# Patient Record
Sex: Male | Born: 1940 | Race: White | Hispanic: No | Marital: Married | Smoking: Former smoker
Health system: Southern US, Community
[De-identification: ages and names within clinical notes are randomized; demographics above are authoritative.]

## PROBLEM LIST (undated history)

## (undated) DIAGNOSIS — I10 Essential (primary) hypertension: Secondary | ICD-10-CM

## (undated) DIAGNOSIS — M1990 Unspecified osteoarthritis, unspecified site: Secondary | ICD-10-CM

## (undated) DIAGNOSIS — M5126 Other intervertebral disc displacement, lumbar region: Secondary | ICD-10-CM

## (undated) DIAGNOSIS — E785 Hyperlipidemia, unspecified: Secondary | ICD-10-CM

## (undated) DIAGNOSIS — N210 Calculus in bladder: Secondary | ICD-10-CM

## (undated) DIAGNOSIS — Z87442 Personal history of urinary calculi: Secondary | ICD-10-CM

## (undated) DIAGNOSIS — I82409 Acute embolism and thrombosis of unspecified deep veins of unspecified lower extremity: Secondary | ICD-10-CM

## (undated) DIAGNOSIS — N400 Enlarged prostate without lower urinary tract symptoms: Secondary | ICD-10-CM

## (undated) DIAGNOSIS — L309 Dermatitis, unspecified: Secondary | ICD-10-CM

## (undated) HISTORY — DX: Hyperlipidemia, unspecified: E78.5

## (undated) HISTORY — DX: Essential (primary) hypertension: I10

## (undated) HISTORY — PX: CHOLECYSTECTOMY: SHX55

---

## 2004-11-21 ENCOUNTER — Ambulatory Visit: Payer: Self-pay | Admitting: Family Medicine

## 2004-11-28 ENCOUNTER — Ambulatory Visit: Payer: Self-pay | Admitting: Family Medicine

## 2005-05-22 ENCOUNTER — Ambulatory Visit: Payer: Self-pay | Admitting: Family Medicine

## 2005-06-01 ENCOUNTER — Ambulatory Visit: Payer: Self-pay | Admitting: Family Medicine

## 2005-12-17 ENCOUNTER — Ambulatory Visit: Payer: Self-pay | Admitting: Family Medicine

## 2005-12-25 ENCOUNTER — Ambulatory Visit: Payer: Self-pay | Admitting: Family Medicine

## 2006-01-14 ENCOUNTER — Ambulatory Visit: Payer: Self-pay | Admitting: Gastroenterology

## 2006-01-27 ENCOUNTER — Ambulatory Visit: Payer: Self-pay | Admitting: Gastroenterology

## 2006-05-10 ENCOUNTER — Ambulatory Visit: Payer: Self-pay | Admitting: Family Medicine

## 2006-05-27 ENCOUNTER — Ambulatory Visit: Payer: Self-pay | Admitting: Family Medicine

## 2006-06-07 ENCOUNTER — Ambulatory Visit: Payer: Self-pay | Admitting: Family Medicine

## 2006-11-29 ENCOUNTER — Ambulatory Visit: Payer: Self-pay | Admitting: Family Medicine

## 2006-11-29 LAB — CONVERTED CEMR LAB
ALT: 19 units/L (ref 0–40)
AST: 17 units/L (ref 0–37)
Albumin: 4.1 g/dL (ref 3.5–5.2)
Alkaline Phosphatase: 49 units/L (ref 39–117)
Bilirubin, Direct: 0.2 mg/dL (ref 0.0–0.3)
Cholesterol: 147 mg/dL (ref 0–200)
HDL: 33.5 mg/dL — ABNORMAL LOW (ref >39.0)
LDL Cholesterol: 83 mg/dL (ref 0–99)
Total Bilirubin: 1 mg/dL (ref 0.3–1.2)
Total CHOL/HDL Ratio: 4.4
Total Protein: 7.4 g/dL (ref 6.0–8.3)
Triglycerides: 152 mg/dL — ABNORMAL HIGH (ref 0–149)
VLDL: 30 mg/dL (ref 0–40)

## 2006-12-08 ENCOUNTER — Ambulatory Visit: Payer: Self-pay | Admitting: Family Medicine

## 2007-05-09 DIAGNOSIS — I10 Essential (primary) hypertension: Secondary | ICD-10-CM | POA: Insufficient documentation

## 2007-06-03 ENCOUNTER — Ambulatory Visit: Payer: Self-pay | Admitting: Family Medicine

## 2007-06-03 LAB — CONVERTED CEMR LAB
Bilirubin Urine: NEGATIVE
Blood in Urine, dipstick: NEGATIVE
Glucose, Urine, Semiquant: NEGATIVE
Ketones, urine, test strip: NEGATIVE
Nitrite: NEGATIVE
Protein, U semiquant: NEGATIVE
Specific Gravity, Urine: 1.025
Urobilinogen, UA: 0.2
WBC Urine, dipstick: NEGATIVE
pH: 5

## 2007-06-07 LAB — CONVERTED CEMR LAB
ALT: 20 units/L (ref 0–53)
AST: 20 units/L (ref 0–37)
Albumin: 3.9 g/dL (ref 3.5–5.2)
Alkaline Phosphatase: 49 units/L (ref 39–117)
BUN: 16 mg/dL (ref 6–23)
Basophils Absolute: 0 10*3/uL (ref 0.0–0.1)
Basophils Relative: 0.7 % (ref 0.0–1.0)
Bilirubin, Direct: 0.2 mg/dL (ref 0.0–0.3)
CO2: 31 meq/L (ref 19–32)
Calcium: 9.3 mg/dL (ref 8.4–10.5)
Chloride: 106 meq/L (ref 96–112)
Cholesterol: 138 mg/dL (ref 0–200)
Creatinine, Ser: 1.2 mg/dL (ref 0.4–1.5)
Creatinine,U: 262.2 mg/dL
Eosinophils Absolute: 0.1 10*3/uL (ref 0.0–0.6)
Eosinophils Relative: 1.9 % (ref 0.0–5.0)
GFR calc Af Amer: 78 mL/min
GFR calc non Af Amer: 64 mL/min
Glucose, Bld: 149 mg/dL — ABNORMAL HIGH (ref 70–99)
HCT: 44.2 % (ref 39.0–52.0)
HDL: 26 mg/dL — ABNORMAL LOW (ref >39.0)
Hemoglobin: 15.2 g/dL (ref 13.0–17.0)
Hgb A1c MFr Bld: 6.7 % — ABNORMAL HIGH (ref 4.6–6.0)
LDL Cholesterol: 92 mg/dL (ref 0–99)
Lymphocytes Relative: 33.4 % (ref 12.0–46.0)
MCHC: 34.4 g/dL (ref 30.0–36.0)
MCV: 89 fL (ref 78.0–100.0)
Microalb Creat Ratio: 4.2 mg/g (ref 0.0–30.0)
Microalb, Ur: 1.1 mg/dL (ref 0.0–1.9)
Monocytes Absolute: 0.7 10*3/uL (ref 0.2–0.7)
Monocytes Relative: 10.7 % (ref 3.0–11.0)
Neutro Abs: 3.8 10*3/uL (ref 1.4–7.7)
Neutrophils Relative %: 53.3 % (ref 43.0–77.0)
PSA: 1.49 ng/mL (ref 0.10–4.00)
Platelets: 165 10*3/uL (ref 150–400)
Potassium: 4 meq/L (ref 3.5–5.1)
RBC: 4.96 M/uL (ref 4.22–5.81)
RDW: 12.2 % (ref 11.5–14.6)
Sodium: 143 meq/L (ref 135–145)
TSH: 3.05 microintl units/mL (ref 0.35–5.50)
Total Bilirubin: 1.5 mg/dL — ABNORMAL HIGH (ref 0.3–1.2)
Total CHOL/HDL Ratio: 5.3
Total Protein: 7.2 g/dL (ref 6.0–8.3)
Triglycerides: 100 mg/dL (ref 0–149)
VLDL: 20 mg/dL (ref 0–40)
WBC: 6.9 10*3/uL (ref 4.5–10.5)

## 2007-06-14 ENCOUNTER — Ambulatory Visit: Payer: Self-pay | Admitting: Family Medicine

## 2007-06-14 DIAGNOSIS — E785 Hyperlipidemia, unspecified: Secondary | ICD-10-CM | POA: Insufficient documentation

## 2007-06-14 DIAGNOSIS — E119 Type 2 diabetes mellitus without complications: Secondary | ICD-10-CM | POA: Insufficient documentation

## 2007-06-17 ENCOUNTER — Telehealth: Payer: Self-pay | Admitting: Family Medicine

## 2007-08-18 ENCOUNTER — Ambulatory Visit: Payer: Self-pay | Admitting: Family Medicine

## 2007-08-22 ENCOUNTER — Telehealth: Payer: Self-pay | Admitting: Family Medicine

## 2007-12-01 ENCOUNTER — Ambulatory Visit: Payer: Self-pay | Admitting: Family Medicine

## 2007-12-02 LAB — CONVERTED CEMR LAB
ALT: 20 units/L (ref 0–53)
AST: 21 units/L (ref 0–37)
Albumin: 3.9 g/dL (ref 3.5–5.2)
Alkaline Phosphatase: 46 units/L (ref 39–117)
Bilirubin, Direct: 0.2 mg/dL (ref 0.0–0.3)
Cholesterol: 139 mg/dL (ref 0–200)
HDL: 30 mg/dL — ABNORMAL LOW (ref >39.0)
Hgb A1c MFr Bld: 6.2 % — ABNORMAL HIGH (ref 4.6–6.0)
LDL Cholesterol: 91 mg/dL (ref 0–99)
Total Bilirubin: 1.3 mg/dL — ABNORMAL HIGH (ref 0.3–1.2)
Total CHOL/HDL Ratio: 4.6
Total Protein: 7.1 g/dL (ref 6.0–8.3)
Triglycerides: 91 mg/dL (ref 0–149)
VLDL: 18 mg/dL (ref 0–40)

## 2008-06-18 ENCOUNTER — Ambulatory Visit: Payer: Self-pay | Admitting: Family Medicine

## 2008-06-20 LAB — CONVERTED CEMR LAB
ALT: 19 units/L (ref 0–53)
AST: 23 units/L (ref 0–37)
Albumin: 4.1 g/dL (ref 3.5–5.2)
Alkaline Phosphatase: 45 units/L (ref 39–117)
BUN: 18 mg/dL (ref 6–23)
Basophils Absolute: 0 10*3/uL (ref 0.0–0.1)
Basophils Relative: 0.4 % (ref 0.0–3.0)
Bilirubin, Direct: 0.1 mg/dL (ref 0.0–0.3)
CO2: 29 meq/L (ref 19–32)
Calcium: 9.1 mg/dL (ref 8.4–10.5)
Chloride: 105 meq/L (ref 96–112)
Cholesterol: 142 mg/dL (ref 0–200)
Creatinine, Ser: 1.1 mg/dL (ref 0.4–1.5)
Creatinine,U: 138.8 mg/dL
Eosinophils Absolute: 0.1 10*3/uL (ref 0.0–0.7)
Eosinophils Relative: 1.7 % (ref 0.0–5.0)
GFR calc Af Amer: 86 mL/min
GFR calc non Af Amer: 71 mL/min
Glucose, Bld: 118 mg/dL — ABNORMAL HIGH (ref 70–99)
HCT: 47.6 % (ref 39.0–52.0)
HDL: 33.8 mg/dL — ABNORMAL LOW (ref >39.0)
Hemoglobin: 16.3 g/dL (ref 13.0–17.0)
Hgb A1c MFr Bld: 6.1 % — ABNORMAL HIGH (ref 4.6–6.0)
LDL Cholesterol: 83 mg/dL (ref 0–99)
Lymphocytes Relative: 28.2 % (ref 12.0–46.0)
MCHC: 34.3 g/dL (ref 30.0–36.0)
MCV: 90.7 fL (ref 78.0–100.0)
Microalb Creat Ratio: 2.9 mg/g (ref 0.0–30.0)
Microalb, Ur: 0.4 mg/dL (ref 0.0–1.9)
Monocytes Absolute: 0.6 10*3/uL (ref 0.1–1.0)
Monocytes Relative: 8.5 % (ref 3.0–12.0)
Neutro Abs: 4.2 10*3/uL (ref 1.4–7.7)
Neutrophils Relative %: 61.2 % (ref 43.0–77.0)
PSA: 1.29 ng/mL (ref 0.10–4.00)
Platelets: 151 10*3/uL (ref 150–400)
Potassium: 3.6 meq/L (ref 3.5–5.1)
RBC: 5.25 M/uL (ref 4.22–5.81)
RDW: 12.3 % (ref 11.5–14.6)
Sodium: 143 meq/L (ref 135–145)
TSH: 3.44 microintl units/mL (ref 0.35–5.50)
Total Bilirubin: 1.1 mg/dL (ref 0.3–1.2)
Total CHOL/HDL Ratio: 4.2
Total Protein: 7.5 g/dL (ref 6.0–8.3)
Triglycerides: 127 mg/dL (ref 0–149)
VLDL: 25 mg/dL (ref 0–40)
WBC: 6.8 10*3/uL (ref 4.5–10.5)

## 2008-06-25 ENCOUNTER — Ambulatory Visit: Payer: Self-pay | Admitting: Family Medicine

## 2008-11-06 ENCOUNTER — Telehealth: Payer: Self-pay | Admitting: Family Medicine

## 2008-12-25 ENCOUNTER — Ambulatory Visit: Payer: Self-pay | Admitting: Family Medicine

## 2008-12-26 ENCOUNTER — Encounter: Payer: Self-pay | Admitting: Family Medicine

## 2008-12-26 LAB — CONVERTED CEMR LAB
ALT: 22 units/L (ref 0–53)
AST: 20 units/L (ref 0–37)
Albumin: 3.9 g/dL (ref 3.5–5.2)
Alkaline Phosphatase: 47 units/L (ref 39–117)
Bilirubin, Direct: 0.1 mg/dL (ref 0.0–0.3)
Cholesterol: 138 mg/dL (ref 0–200)
HDL: 29.7 mg/dL — ABNORMAL LOW (ref >39.00)
Hgb A1c MFr Bld: 6.3 % (ref 4.6–6.5)
LDL Cholesterol: 79 mg/dL (ref 0–99)
Total Bilirubin: 1.1 mg/dL (ref 0.3–1.2)
Total CHOL/HDL Ratio: 5
Total Protein: 7.4 g/dL (ref 6.0–8.3)
Triglycerides: 149 mg/dL (ref 0.0–149.0)
VLDL: 29.8 mg/dL (ref 0.0–40.0)

## 2009-01-01 ENCOUNTER — Ambulatory Visit: Payer: Self-pay | Admitting: Family Medicine

## 2009-01-01 DIAGNOSIS — K432 Incisional hernia without obstruction or gangrene: Secondary | ICD-10-CM | POA: Insufficient documentation

## 2009-01-23 ENCOUNTER — Encounter: Admission: RE | Admit: 2009-01-23 | Discharge: 2009-01-23 | Payer: Self-pay | Admitting: General Surgery

## 2009-06-11 ENCOUNTER — Ambulatory Visit: Payer: Self-pay | Admitting: Family Medicine

## 2009-06-11 LAB — CONVERTED CEMR LAB
Bilirubin Urine: NEGATIVE
Blood in Urine, dipstick: NEGATIVE
Glucose, Urine, Semiquant: NEGATIVE
Ketones, urine, test strip: NEGATIVE
Nitrite: NEGATIVE
Protein, U semiquant: NEGATIVE
Specific Gravity, Urine: 1.025
Urobilinogen, UA: 0.2
WBC Urine, dipstick: NEGATIVE
pH: 5

## 2009-06-12 LAB — CONVERTED CEMR LAB
ALT: 21 units/L (ref 0–53)
AST: 24 units/L (ref 0–37)
Albumin: 4.2 g/dL (ref 3.5–5.2)
Alkaline Phosphatase: 47 units/L (ref 39–117)
BUN: 16 mg/dL (ref 6–23)
Basophils Absolute: 0.1 10*3/uL (ref 0.0–0.1)
Basophils Relative: 1.2 % (ref 0.0–3.0)
Bilirubin, Direct: 0.1 mg/dL (ref 0.0–0.3)
CO2: 28 meq/L (ref 19–32)
Calcium: 9.1 mg/dL (ref 8.4–10.5)
Chloride: 111 meq/L (ref 96–112)
Cholesterol: 130 mg/dL (ref 0–200)
Creatinine, Ser: 1.1 mg/dL (ref 0.4–1.5)
Creatinine,U: 181.4 mg/dL
Eosinophils Absolute: 0.1 10*3/uL (ref 0.0–0.7)
Eosinophils Relative: 1.4 % (ref 0.0–5.0)
GFR calc non Af Amer: 70.63 mL/min (ref >60)
Glucose, Bld: 140 mg/dL — ABNORMAL HIGH (ref 70–99)
HCT: 48.1 % (ref 39.0–52.0)
HDL: 29.2 mg/dL — ABNORMAL LOW (ref >39.00)
Hemoglobin: 16.4 g/dL (ref 13.0–17.0)
Hgb A1c MFr Bld: 6.4 % (ref 4.6–6.5)
LDL Cholesterol: 77 mg/dL (ref 0–99)
Lymphocytes Relative: 33.1 % (ref 12.0–46.0)
Lymphs Abs: 2.3 10*3/uL (ref 0.7–4.0)
MCHC: 34.1 g/dL (ref 30.0–36.0)
MCV: 91.2 fL (ref 78.0–100.0)
Microalb Creat Ratio: 0.6 mg/g (ref 0.0–30.0)
Microalb, Ur: 0.1 mg/dL (ref 0.0–1.9)
Monocytes Absolute: 0.7 10*3/uL (ref 0.1–1.0)
Monocytes Relative: 9.5 % (ref 3.0–12.0)
Neutro Abs: 3.7 10*3/uL (ref 1.4–7.7)
Neutrophils Relative %: 54.8 % (ref 43.0–77.0)
PSA: 1.89 ng/mL (ref 0.10–4.00)
Platelets: 149 10*3/uL — ABNORMAL LOW (ref 150.0–400.0)
Potassium: 4 meq/L (ref 3.5–5.1)
RBC: 5.27 M/uL (ref 4.22–5.81)
RDW: 12.2 % (ref 11.5–14.6)
Sodium: 142 meq/L (ref 135–145)
TSH: 2.72 microintl units/mL (ref 0.35–5.50)
Total Bilirubin: 1.1 mg/dL (ref 0.3–1.2)
Total CHOL/HDL Ratio: 4
Total Protein: 7.7 g/dL (ref 6.0–8.3)
Triglycerides: 120 mg/dL (ref 0.0–149.0)
VLDL: 24 mg/dL (ref 0.0–40.0)
WBC: 6.9 10*3/uL (ref 4.5–10.5)

## 2009-06-18 ENCOUNTER — Ambulatory Visit: Payer: Self-pay | Admitting: Family Medicine

## 2009-09-20 ENCOUNTER — Telehealth: Payer: Self-pay | Admitting: Family Medicine

## 2009-10-02 ENCOUNTER — Ambulatory Visit: Payer: Self-pay | Admitting: Family Medicine

## 2010-01-06 ENCOUNTER — Ambulatory Visit: Payer: Self-pay | Admitting: Family Medicine

## 2010-01-08 LAB — CONVERTED CEMR LAB
ALT: 23 units/L (ref 0–53)
AST: 23 units/L (ref 0–37)
Albumin: 3.8 g/dL (ref 3.5–5.2)
Alkaline Phosphatase: 45 units/L (ref 39–117)
Bilirubin, Direct: 0 mg/dL (ref 0.0–0.3)
Cholesterol: 113 mg/dL (ref 0–200)
HDL: 35.5 mg/dL — ABNORMAL LOW (ref >39.00)
LDL Cholesterol: 60 mg/dL (ref 0–99)
Total Bilirubin: 0.6 mg/dL (ref 0.3–1.2)
Total CHOL/HDL Ratio: 3
Total Protein: 6.9 g/dL (ref 6.0–8.3)
Triglycerides: 90 mg/dL (ref 0.0–149.0)
VLDL: 18 mg/dL (ref 0.0–40.0)

## 2010-01-13 ENCOUNTER — Ambulatory Visit: Payer: Self-pay | Admitting: Family Medicine

## 2010-06-17 ENCOUNTER — Telehealth: Payer: Self-pay | Admitting: Family Medicine

## 2010-07-28 ENCOUNTER — Ambulatory Visit: Payer: Self-pay | Admitting: Family Medicine

## 2010-07-28 ENCOUNTER — Encounter: Payer: Self-pay | Admitting: Family Medicine

## 2010-07-30 LAB — CONVERTED CEMR LAB
ALT: 24 units/L (ref 0–53)
AST: 24 units/L (ref 0–37)
Albumin: 4.3 g/dL (ref 3.5–5.2)
Alkaline Phosphatase: 50 units/L (ref 39–117)
BUN: 18 mg/dL (ref 6–23)
Basophils Absolute: 0 10*3/uL (ref 0.0–0.1)
Basophils Relative: 0.6 % (ref 0.0–3.0)
Bilirubin, Direct: 0.2 mg/dL (ref 0.0–0.3)
CO2: 28 meq/L (ref 19–32)
Calcium: 9.3 mg/dL (ref 8.4–10.5)
Chloride: 101 meq/L (ref 96–112)
Cholesterol: 156 mg/dL (ref 0–200)
Creatinine, Ser: 1.1 mg/dL (ref 0.4–1.5)
Creatinine,U: 171.5 mg/dL
Eosinophils Absolute: 0.1 10*3/uL (ref 0.0–0.7)
Eosinophils Relative: 1.2 % (ref 0.0–5.0)
GFR calc non Af Amer: 74.28 mL/min (ref >60)
Glucose, Bld: 158 mg/dL — ABNORMAL HIGH (ref 70–99)
HCT: 46.5 % (ref 39.0–52.0)
HDL: 35.8 mg/dL — ABNORMAL LOW (ref >39.00)
Hemoglobin: 16.1 g/dL (ref 13.0–17.0)
Hgb A1c MFr Bld: 7 % — ABNORMAL HIGH (ref 4.6–6.5)
LDL Cholesterol: 93 mg/dL (ref 0–99)
Lymphocytes Relative: 28.7 % (ref 12.0–46.0)
Lymphs Abs: 2.3 10*3/uL (ref 0.7–4.0)
MCHC: 34.7 g/dL (ref 30.0–36.0)
MCV: 90.9 fL (ref 78.0–100.0)
Microalb Creat Ratio: 0.3 mg/g (ref 0.0–30.0)
Microalb, Ur: 0.6 mg/dL (ref 0.0–1.9)
Monocytes Absolute: 0.7 10*3/uL (ref 0.1–1.0)
Monocytes Relative: 8.7 % (ref 3.0–12.0)
Neutro Abs: 4.9 10*3/uL (ref 1.4–7.7)
Neutrophils Relative %: 60.8 % (ref 43.0–77.0)
PSA: 1.92 ng/mL (ref 0.10–4.00)
Platelets: 157 10*3/uL (ref 150.0–400.0)
Potassium: 3.8 meq/L (ref 3.5–5.1)
RBC: 5.11 M/uL (ref 4.22–5.81)
RDW: 13.2 % (ref 11.5–14.6)
Sodium: 137 meq/L (ref 135–145)
TSH: 2.9 microintl units/mL (ref 0.35–5.50)
Total Bilirubin: 1.2 mg/dL (ref 0.3–1.2)
Total CHOL/HDL Ratio: 4
Total Protein: 7.4 g/dL (ref 6.0–8.3)
Triglycerides: 136 mg/dL (ref 0.0–149.0)
VLDL: 27.2 mg/dL (ref 0.0–40.0)
WBC: 8 10*3/uL (ref 4.5–10.5)

## 2010-10-14 NOTE — Assessment & Plan Note (Signed)
Summary: cpx/jls   Vital Signs:  Patient Profile:   70 Years Old Male Height:     71.5 inches Weight:      220 pounds Temp:     98.3 degrees F oral Pulse rate:   75 / minute BP sitting:   112 / 72  (left arm) Cuff size:   large  Vitals Entered By: Alfred Levins, CMA (June 25, 2008 2:07 PM)                 Chief Complaint:  cpx.  History of Present Illness: 70 yr old male for cpx. Feels good, no concerns. Walks regularly.    Current Allergies (reviewed today): ! ACE INHIBITORS  Past Medical History:    Reviewed history from 06/14/2007 and no changes required:       Hypertension       Hyperlipidemia       Diabetes mellitus, type II  Past Surgical History:    Reviewed history from 06/14/2007 and no changes required:       Cholecystectomy       colonoscopy 01-27-06 per Dr. Jarold Motto, repeat in 10 yrs   Family History:    Reviewed history from 05/09/2007 and no changes required:       Family History Diabetes 1st degree relative       Family History Hypertension       Family History of Sudden Death  Social History:    Reviewed history from 05/09/2007 and no changes required:       Retired       Married       Never Smoked       Alcohol use-yes       Drug use-no       Regular exercise-yes    Review of Systems  The patient denies anorexia, fever, weight loss, weight gain, vision loss, decreased hearing, hoarseness, chest pain, syncope, dyspnea on exertion, peripheral edema, prolonged cough, headaches, hemoptysis, abdominal pain, melena, hematochezia, severe indigestion/heartburn, hematuria, incontinence, genital sores, muscle weakness, suspicious skin lesions, transient blindness, difficulty walking, depression, unusual weight change, abnormal bleeding, enlarged lymph nodes, angioedema, breast masses, and testicular masses.     Physical Exam  General:     Well-developed,well-nourished,in no acute distress; alert,appropriate and cooperative throughout  examination Head:     Normocephalic and atraumatic without obvious abnormalities. No apparent alopecia or balding. Eyes:     No corneal or conjunctival inflammation noted. EOMI. Perrla. Funduscopic exam benign, without hemorrhages, exudates or papilledema. Vision grossly normal. Ears:     External ear exam shows no significant lesions or deformities.  Otoscopic examination reveals clear canals, tympanic membranes are intact bilaterally without bulging, retraction, inflammation or discharge. Hearing is grossly normal bilaterally. Nose:     External nasal examination shows no deformity or inflammation. Nasal mucosa are pink and moist without lesions or exudates. Mouth:     Oral mucosa and oropharynx without lesions or exudates.  Teeth in good repair. Neck:     No deformities, masses, or tenderness noted. Chest Wall:     No deformities, masses, tenderness or gynecomastia noted. Lungs:     Normal respiratory effort, chest expands symmetrically. Lungs are clear to auscultation, no crackles or wheezes. Heart:     Normal rate and regular rhythm. S1 and S2 normal without gallop, murmur, click, rub or other extra sounds. EKG normal Abdomen:     soft, non-tender, normal bowel sounds, no distention, no masses, no guarding, no rigidity, and no rebound tenderness.  Has a nontender reducible incisional hernia Rectal:     No external abnormalities noted. Normal sphincter tone. No rectal masses or tenderness. Heme neg. Genitalia:     Testes bilaterally descended without nodularity, tenderness or masses. No scrotal masses or lesions. No penis lesions or urethral discharge. Prostate:     Prostate gland firm and smooth, no enlargement, nodularity, tenderness, mass, asymmetry or induration. Msk:     No deformity or scoliosis noted of thoracic or lumbar spine.   Pulses:     R and L carotid,radial,femoral,dorsalis pedis and posterior tibial pulses are full and equal bilaterally Extremities:     No  clubbing, cyanosis, edema, or deformity noted with normal full range of motion of all joints.   Neurologic:     No cranial nerve deficits noted. Station and gait are normal. Plantar reflexes are down-going bilaterally. DTRs are symmetrical throughout. Sensory, motor and coordinative functions appear intact. Skin:     Intact without suspicious lesions or rashes Cervical Nodes:     No lymphadenopathy noted Axillary Nodes:     No palpable lymphadenopathy Inguinal Nodes:     No significant adenopathy Psych:     Cognition and judgment appear intact. Alert and cooperative with normal attention span and concentration. No apparent delusions, illusions, hallucinations    Impression & Recommendations:  Problem # 1:  EXAMINATION, ROUTINE MEDICAL (ICD-V70.0) Flu Vaccine Consent Questions     Do you have a history of severe allergic reactions to this vaccine? no    Any prior history of allergic reactions to egg and/or gelatin? no    Do you have a sensitivity to the preservative Thimersol? no    Do you have a past history of Guillan-Barre Syndrome? no    Do you currently have an acute febrile illness? no    Have you ever had a severe reaction to latex? no    Vaccine information given and explained to patient? yes    Are you currently pregnant? no    Lot Number:[id number]   Site Given  Left Deltoid IM Orders: Hemoccult Guaiac-1 spec.(in office) (84166) EKG w/ Interpretation (93000)   Complete Medication List: 1)  Hydrochlorothiazide 25 Mg Tabs (Hydrochlorothiazide) .... Take 1 tablet by mouth once a day 2)  Lipitor 20 Mg Tabs (Atorvastatin calcium) .... Take 1 tablet by mouth once a day 3)  Norvasc 10 Mg Tabs (Amlodipine besylate) .... Once daily 4)  Hycodan 5-1.5 Mg/22ml Syrp (Hydrocodone-homatropine) .Marland Kitchen.. 1 tsp every 4 hours as needed cough  Other Orders: Admin 1st Vaccine (06301) Flu Vaccine 43yrs + (60109)   Patient Instructions: 1)  Please schedule a follow-up appointment in 6  months.   ]

## 2010-10-14 NOTE — Assessment & Plan Note (Signed)
Summary: cpx/njr   Vital Signs:  Patient Profile:   70 Years Old Male Height:     70 inches Weight:      225 pounds Temp:     98.5 degrees F oral Pulse rate:   84 / minute Pulse rhythm:   regular BP sitting:   122 / 72  (left arm)                 Chief Complaint:  cpx.  History of Present Illness: 70 yr old male for cpx. Had colonoscopy in 5-07 per Dr. Jarold Motto. Feels fine. Labs recently showed an A1c of 6.7. Still exercises regularly but admits to a poor diet.  Current Allergies: ! ACE INHIBITORS  Past Medical History:    Reviewed history from 05/09/2007 and no changes required:       Hypertension       Hyperlipidemia       Diabetes mellitus, type II  Past Surgical History:    Reviewed history from 05/09/2007 and no changes required:       Cholecystectomy   Family History:    Reviewed history from 05/09/2007 and no changes required:       Family History Diabetes 1st degree relative       Family History Hypertension       Family History of Sudden Death  Social History:    Reviewed history from 05/09/2007 and no changes required:       Retired       Married       Never Smoked       Alcohol use-yes       Drug use-no       Regular exercise-yes    Review of Systems  The patient denies anorexia, fever, weight loss, vision loss, decreased hearing, hoarseness, chest pain, syncope, dyspnea on exhertion, peripheral edema, prolonged cough, hemoptysis, abdominal pain, melena, hematochezia, severe indigestion/heartburn, hematuria, incontinence, genital sores, muscle weakness, suspicious skin lesions, transient blindness, difficulty walking, depression, unusual weight change, abnormal bleeding, enlarged lymph nodes, angioedema, breast masses, and testicular masses.     Physical Exam  General:     overweight-appearing.   Head:     Normocephalic and atraumatic without obvious abnormalities. No apparent alopecia or balding. Eyes:     No corneal or conjunctival  inflammation noted. EOMI. Perrla. Funduscopic exam benign, without hemorrhages, exudates or papilledema. Vision grossly normal. Ears:     External ear exam shows no significant lesions or deformities.  Otoscopic examination reveals clear canals, tympanic membranes are intact bilaterally without bulging, retraction, inflammation or discharge. Hearing is grossly normal bilaterally. Nose:     External nasal examination shows no deformity or inflammation. Nasal mucosa are pink and moist without lesions or exudates. Mouth:     Oral mucosa and oropharynx without lesions or exudates.  Teeth in good repair. Neck:     No deformities, masses, or tenderness noted. Lungs:     Normal respiratory effort, chest expands symmetrically. Lungs are clear to auscultation, no crackles or wheezes. Heart:     Normal rate and regular rhythm. S1 and S2 normal without gallop, murmur, click, rub or other extra sounds. EKG normal Abdomen:     soft, non-tender, normal bowel sounds, no distention, no masses, no guarding, no rigidity, no rebound tenderness, no inguinal hernia, no hepatomegaly, and no splenomegaly.  Has a small reducible nontender ventral hernia along incisional scar. Rectal:     No external abnormalities noted. Normal sphincter tone. No rectal masses or  tenderness. Heme neg. Genitalia:     Testes bilaterally descended without nodularity, tenderness or masses. No scrotal masses or lesions. No penis lesions or urethral discharge.circumcised.   Prostate:     Prostate gland firm and smooth, no enlargement, nodularity, tenderness, mass, asymmetry or induration. Msk:     No deformity or scoliosis noted of thoracic or lumbar spine.   Pulses:     R and L carotid,radial,femoral,dorsalis pedis and posterior tibial pulses are full and equal bilaterally Extremities:     No clubbing, cyanosis, edema, or deformity noted with normal full range of motion of all joints.   Neurologic:     No cranial nerve deficits noted.  Station and gait are normal. Plantar reflexes are down-going bilaterally. DTRs are symmetrical throughout. Sensory, motor and coordinative functions appear intact. Skin:     Intact without suspicious lesions or rashes Cervical Nodes:     No lymphadenopathy noted Axillary Nodes:     No palpable lymphadenopathy Inguinal Nodes:     No significant adenopathy Psych:     Cognition and judgment appear intact. Alert and cooperative with normal attention span and concentration. No apparent delusions, illusions, hallucinations    Impression & Recommendations:  Problem # 1:  EXAMINATION, ROUTINE MEDICAL (ICD-V70.0)  Orders: Hemoccult Guaiac-1 spec.(in office) (57846) EKG w/ Interpretation (93000)   Complete Medication List: 1)  Hydrochlorothiazide 25 Mg Tabs (Hydrochlorothiazide) .... Take 1 tablet by mouth once a day 2)  Lipitor 20 Mg Tabs (Atorvastatin calcium) .... Take 1 tablet by mouth once a day 3)  Amlodipine Besy-benazepril Hcl 5-20 Mg Caps (Amlodipine besy-benazepril hcl) .... Once daily   Patient Instructions: 1)  It is important that you exercise regularly at least 20 minutes 5 times a week. If you develop chest pain, have severe difficulty breathing, or feel very tired , stop exercising immediately and seek medical attention. 2)  You need to lose weight. Consider a lower calorie diet and regular exercise. Will recheck in 6 months.    Prescriptions: AMLODIPINE BESY-BENAZEPRIL HCL 5-20 MG  CAPS (AMLODIPINE BESY-BENAZEPRIL HCL) once daily  #30 x 11   Entered and Authorized by:   Nelwyn Salisbury MD   Signed by:   Nelwyn Salisbury MD on 06/14/2007   Method used:   Electronically sent to ...       [facility]*       [address] [place], [place]  [address]       Ph: [phone]       Fax: [phone]   RxID:   [id number] LIPITOR 20 MG  TABS (ATORVASTATIN CALCIUM) Take 1 tablet by mouth once a day  #30 x 11   Entered  and Authorized by:   Nelwyn Salisbury MD   Signed by:   Nelwyn Salisbury MD on 06/14/2007   Method used:   Electronically sent to ...       [facility]*       [address] [place], [place]  [address]       Ph: [phone]       Fax: [phone]   RxID:   [id number]  ]

## 2010-10-14 NOTE — Progress Notes (Signed)
Summary: LABS AND DX CODEA  Phone Note Call from Patient   Caller: PT WIFE Call For: Alessio Bogan Summary of Call: PATIENT NEEDS LABS ORDER AND WIFE DID NOT KNOW WHAT LABS, LET ME KNOW WHAT LABS AND DX CODES. Initial call taken by: Celine Ahr,  November 06, 2008 11:14 AM  Follow-up for Phone Call        lipids and liver for 272.4, and A1c for 250.00 Follow-up by: Nelwyn Salisbury MD,  November 06, 2008 3:37 PM

## 2010-10-14 NOTE — Assessment & Plan Note (Signed)
Summary: sinus/mhf   Vital Signs:  Patient Profile:   70 Years Old Male Height:     70 inches Weight:      223 pounds Temp:     98.5 degrees F oral BP sitting:   104 / 72  (left arm) Cuff size:   large  Vitals Entered By: Alfred Levins, CMA (August 18, 2007 9:27 AM)                 Chief Complaint:  dry cough x 6wks.  History of Present Illness: For the past 6 weeks has had some PND, upper chest congestion, and a dry cough especially at night. No fever or SOB or ST.  Current Allergies: ! ACE INHIBITORS     Review of Systems      See HPI   Physical Exam  General:     Well-developed,well-nourished,in no acute distress; alert,appropriate and cooperative throughout examination Head:     Normocephalic and atraumatic without obvious abnormalities. No apparent alopecia or balding. Eyes:     No corneal or conjunctival inflammation noted. EOMI. Perrla. Funduscopic exam benign, without hemorrhages, exudates or papilledema. Vision grossly normal. Ears:     External ear exam shows no significant lesions or deformities.  Otoscopic examination reveals clear canals, tympanic membranes are intact bilaterally without bulging, retraction, inflammation or discharge. Hearing is grossly normal bilaterally. Nose:     External nasal examination shows no deformity or inflammation. Nasal mucosa are pink and moist without lesions or exudates. Mouth:     Oral mucosa and oropharynx without lesions or exudates.  Teeth in good repair. Neck:     No deformities, masses, or tenderness noted. Lungs:     Normal respiratory effort, chest expands symmetrically. Lungs are clear to auscultation, no crackles or wheezes.    Impression & Recommendations:  Problem # 1:  ACUTE BRONCHITIS (ICD-466.0)  His updated medication list for this problem includes:    Biaxin Xl 500 Mg Tb24 (Clarithromycin) .Marland Kitchen... 2 once daily    Hycodan 5-1.5 Mg/3ml Syrp (Hydrocodone-homatropine) .Marland Kitchen... 1 tsp every 4 hours as  needed cough   Complete Medication List: 1)  Hydrochlorothiazide 25 Mg Tabs (Hydrochlorothiazide) .... Take 1 tablet by mouth once a day 2)  Lipitor 20 Mg Tabs (Atorvastatin calcium) .... Take 1 tablet by mouth once a day 3)  Norvasc 10 Mg Tabs (Amlodipine besylate) .... Once daily 4)  Biaxin Xl 500 Mg Tb24 (Clarithromycin) .... 2 once daily 5)  Hycodan 5-1.5 Mg/18ml Syrp (Hydrocodone-homatropine) .Marland Kitchen.. 1 tsp every 4 hours as needed cough   Patient Instructions: 1)  Please schedule a follow-up appointment as needed.    Prescriptions: HYCODAN 5-1.5 MG/5ML  SYRP (HYDROCODONE-HOMATROPINE) 1 tsp every 4 hours as needed cough  #240 ml x 0   Entered and Authorized by:   Nelwyn Salisbury MD   Signed by:   Nelwyn Salisbury MD on 08/18/2007   Method used:   Print then Give to Patient   RxID:   [id number] BIAXIN XL 500 MG  TB24 (CLARITHROMYCIN) 2 once daily  #20 x 0   Entered and Authorized by:   Nelwyn Salisbury MD   Signed by:   Nelwyn Salisbury MD on 08/18/2007   Method used:   Print then Give to Patient   RxID:   [id number]  ]

## 2010-10-14 NOTE — Progress Notes (Signed)
Summary: lipitor refills   Phone Note From Pharmacy   [facility] Summary of Call: refill lipitor  Initial call taken by: Pura Spice, RN,  June 17, 2010 4:54 PM  Follow-up for Phone Call        ok per dr Maryna Yeagle to refill x 1 yr.  Follow-up by: Pura Spice, RN,  June 17, 2010 4:54 PM    New/Updated Medications: LIPITOR 20 MG  TABS (ATORVASTATIN CALCIUM) Take 1 tablet by mouth once a day Prescriptions: LIPITOR 20 MG  TABS (ATORVASTATIN CALCIUM) Take 1 tablet by mouth once a day  #30 x 11   Entered by:   Pura Spice, RN   Authorized by:   Nelwyn Salisbury MD   Signed by:   Pura Spice, RN on 06/17/2010   Method used:   Electronically to        [facility]* (retail)       [address] [place], [place]  [address]       Ph: [phone]       Fax: [phone]   RxID:   [id number]

## 2010-10-14 NOTE — Assessment & Plan Note (Signed)
Summary: 6 month fup/will fast/ccm   Vital Signs:  Patient Profile:   70 Years Old Male Height:     70 inches Weight:      220 pounds Temp:     97.9 degrees F oral Pulse rate:   59 / minute BP sitting:   128 / 74  (left arm) Cuff size:   large  Vitals Entered By: Alfred Levins, CMA (December 01, 2007 8:23 AM)                 Chief Complaint:  6 mth f/u and fasting.  History of Present Illness: Doing well. Here for fasting labs with his wife. Watching his diet.    Current Allergies: ! ACE INHIBITORS  Past Medical History:    Reviewed history from 06/14/2007 and no changes required:       Hypertension       Hyperlipidemia       Diabetes mellitus, type II     Review of Systems  The patient denies anorexia, fever, weight loss, weight gain, vision loss, decreased hearing, hoarseness, chest pain, syncope, dyspnea on exhertion, peripheral edema, prolonged cough, hemoptysis, abdominal pain, melena, hematochezia, severe indigestion/heartburn, hematuria, incontinence, genital sores, muscle weakness, suspicious skin lesions, transient blindness, difficulty walking, depression, unusual weight change, abnormal bleeding, enlarged lymph nodes, angioedema, breast masses, and testicular masses.     Physical Exam  General:     Well-developed,well-nourished,in no acute distress; alert,appropriate and cooperative throughout examination Skin:     has a large blackhead on the lower left back. This was emptied out with gentle squeezing today.    Impression & Recommendations:  Problem # 1:  DIABETES MELLITUS, TYPE II (ICD-250.00)  Orders: TLB-A1C / Hgb A1C (Glycohemoglobin) (83036-A1C)   Problem # 2:  HYPERLIPIDEMIA (ICD-272.4)  His updated medication list for this problem includes:    Lipitor 20 Mg Tabs (Atorvastatin calcium) .Marland Kitchen... Take 1 tablet by mouth once a day  Orders: Venipuncture ([address]) TLB-Lipid Panel (80061-LIPID) TLB-Hepatic/Liver Function Pnl (80076-HEPATIC)    Problem # 3:  HYPERTENSION (ICD-401.9)  His updated medication list for this problem includes:    Hydrochlorothiazide 25 Mg Tabs (Hydrochlorothiazide) .Marland Kitchen... Take 1 tablet by mouth once a day    Norvasc 10 Mg Tabs (Amlodipine besylate) ..... Once daily   Complete Medication List: 1)  Hydrochlorothiazide 25 Mg Tabs (Hydrochlorothiazide) .... Take 1 tablet by mouth once a day 2)  Lipitor 20 Mg Tabs (Atorvastatin calcium) .... Take 1 tablet by mouth once a day 3)  Norvasc 10 Mg Tabs (Amlodipine besylate) .... Once daily 4)  Hycodan 5-1.5 Mg/36ml Syrp (Hydrocodone-homatropine) .Marland Kitchen.. 1 tsp every 4 hours as needed cough   Patient Instructions: 1)  check labs    ]

## 2010-10-14 NOTE — Progress Notes (Signed)
Summary: labs needed?  Phone Note Call from Patient Call back at [phone]   Caller: Spouse-live call Summary of Call: pt was seen in Oct. Wife calls to today to set up 6 month fup. She stated that husband will need labs. Which ones? Initial call taken by: Warnell Forester,  September 20, 2009 9:39 AM  Follow-up for Phone Call        lipids and liver for 272.4 Follow-up by: Nelwyn Salisbury MD,  September 20, 2009 10:34 AM  Additional Follow-up for Phone Call Additional follow up Details #1::        wife is aware of above. Appt. has been made. Additional Follow-up by: Warnell Forester,  September 20, 2009 2:07 PM

## 2010-10-14 NOTE — Assessment & Plan Note (Signed)
Summary: ear clogged//ccm   Vital Signs:  Patient profile:   70 year old male Weight:      224 pounds BMI:     30.92 Temp:     97.8 degrees F oral BP sitting:   102 / 76  (left arm) Cuff size:   large  Vitals Entered By: Alfred Levins, CMA (October 02, 2009 11:20 AM) CC: ears clogged   History of Present Illness: Here to have his ears cleared out. He says sounds seem muffled. No pain or other symptoms.   Allergies: 1)  ! Ace Inhibitors 2)  ! Hydrocodone  Review of Systems  The patient denies anorexia, fever, weight loss, weight gain, vision loss, hoarseness, chest pain, syncope, dyspnea on exertion, peripheral edema, prolonged cough, headaches, hemoptysis, abdominal pain, melena, hematochezia, severe indigestion/heartburn, hematuria, incontinence, genital sores, muscle weakness, suspicious skin lesions, transient blindness, difficulty walking, depression, unusual weight change, abnormal bleeding, enlarged lymph nodes, angioedema, breast masses, and testicular masses.    Physical Exam  General:  Well-developed,well-nourished,in no acute distress; alert,appropriate and cooperative throughout examination Ears:  both canals occluded by cerumen   Impression & Recommendations:  Problem # 1:  CERUMEN IMPACTION (ICD-380.4)  Complete Medication List: 1)  Hydrochlorothiazide 25 Mg Tabs (Hydrochlorothiazide) .... Take 1 tablet by mouth once a day 2)  Lipitor 20 Mg Tabs (Atorvastatin calcium) .... Take 1 tablet by mouth once a day 3)  Benazepril Hcl 20 Mg Tabs (Benazepril hcl) .Marland Kitchen.. 1 by mouth daily 4)  Amlodipine Besylate 5 Mg Tabs (Amlodipine besylate) .Marland Kitchen.. 1 by mouth every day  Patient Instructions: 1)  both ears were irrigated clear with water

## 2010-10-14 NOTE — Assessment & Plan Note (Signed)
Summary: emp--will fast//ccm   Vital Signs:  Patient profile:   70 year old male Height:      70 inches Weight:      220 pounds BMI:     31.68 O2 Sat:      98 % Temp:     98.7 degrees F Pulse rate:   74 / minute BP sitting:   120 / 80  (left arm)  Vitals Entered By: Pura Spice, RN (July 28, 2010 9:57 AM) CC: cpx fasting wants ears flushed    History of Present Illness: 70 yr old male for a cpx. he has no concerns. He gets some pain from the large ventral hernia he has, but this is rare and he tolerates it well.   Allergies: 1)  ! Ace Inhibitors 2)  ! Hydrocodone  Past History:  Past Medical History: Reviewed history from 06/18/2009 and no changes required. Hypertension Hyperlipidemia Diabetes mellitus, type II ventral hernia, followed by Dr. Alyse Low  Past Surgical History: Reviewed history from 06/25/2008 and no changes required. Cholecystectomy colonoscopy 01-27-06 per Dr. Jarold Motto, repeat in 10 yrs  Family History: Reviewed history from 05/09/2007 and no changes required. Family History Diabetes 1st degree relative Family History Hypertension Family History of Sudden Death  Social History: Reviewed history from 05/09/2007 and no changes required. Retired Married Never Smoked Alcohol use-yes Drug use-no Regular exercise-yes  Review of Systems  The patient denies anorexia, fever, weight loss, weight gain, vision loss, decreased hearing, hoarseness, chest pain, syncope, dyspnea on exertion, peripheral edema, prolonged cough, headaches, hemoptysis, abdominal pain, melena, hematochezia, severe indigestion/heartburn, hematuria, incontinence, genital sores, muscle weakness, suspicious skin lesions, transient blindness, difficulty walking, depression, unusual weight change, abnormal bleeding, enlarged lymph nodes, angioedema, breast masses, and testicular masses.    Physical Exam  General:  overweight-appearing.   Head:  Normocephalic and atraumatic  without obvious abnormalities. No apparent alopecia or balding. Eyes:  No corneal or conjunctival inflammation noted. EOMI. Perrla. Funduscopic exam benign, without hemorrhages, exudates or papilledema. Vision grossly normal. Ears:  External ear exam shows no significant lesions or deformities.  Otoscopic examination reveals clear canals, tympanic membranes are intact bilaterally without bulging, retraction, inflammation or discharge. Hearing is grossly normal bilaterally. Nose:  External nasal examination shows no deformity or inflammation. Nasal mucosa are pink and moist without lesions or exudates. Mouth:  Oral mucosa and oropharynx without lesions or exudates.  Teeth in good repair. Neck:  No deformities, masses, or tenderness noted. Chest Wall:  No deformities, masses, tenderness or gynecomastia noted. Lungs:  Normal respiratory effort, chest expands symmetrically. Lungs are clear to auscultation, no crackles or wheezes. Heart:  Normal rate and regular rhythm. S1 and S2 normal without gallop, murmur, click, rub or other extra sounds. EKG normal Abdomen:  soft, non-tender, normal bowel sounds, no distention, no masses, no guarding, no rigidity, no rebound tenderness, no inguinal hernia, no hepatomegaly, and no splenomegaly.  Has a large reducible non-tender ventral hernia along the midline  Rectal:  No external abnormalities noted. Normal sphincter tone. No rectal masses or tenderness. Heme neg. Genitalia:  Testes bilaterally descended without nodularity, tenderness or masses. No scrotal masses or lesions. No penis lesions or urethral discharge. Prostate:  no nodules, no asymmetry, no induration, and 2+ enlarged.   Msk:  No deformity or scoliosis noted of thoracic or lumbar spine.   Pulses:  R and L carotid,radial,femoral,dorsalis pedis and posterior tibial pulses are full and equal bilaterally Extremities:  No clubbing, cyanosis, edema, or deformity noted with  normal full range of motion of all  joints.   Neurologic:  No cranial nerve deficits noted. Station and gait are normal. Plantar reflexes are down-going bilaterally. DTRs are symmetrical throughout. Sensory, motor and coordinative functions appear intact. Skin:  Intact without suspicious lesions or rashes Cervical Nodes:  No lymphadenopathy noted Axillary Nodes:  No palpable lymphadenopathy Inguinal Nodes:  No significant adenopathy Psych:  Cognition and judgment appear intact. Alert and cooperative with normal attention span and concentration. No apparent delusions, illusions, hallucinations   Impression & Recommendations:  Problem # 1:  EXAMINATION, ROUTINE MEDICAL (ICD-V70.0)  Orders: UA Dipstick w/o Micro (automated)  (81003) Hemoccult Guaiac-1 spec.(in office) (82270) EKG w/ Interpretation (93000) Venipuncture (78295) TLB-Lipid Panel (80061-LIPID) TLB-BMP (Basic Metabolic Panel-BMET) (80048-METABOL) TLB-CBC Platelet - w/Differential (85025-CBCD) TLB-Hepatic/Liver Function Pnl (80076-HEPATIC) TLB-TSH (Thyroid Stimulating Hormone) (84443-TSH) TLB-PSA (Prostate Specific Antigen) (84153-PSA)  Complete Medication List: 1)  Hydrochlorothiazide 25 Mg Tabs (Hydrochlorothiazide) .... Take 1 tablet by mouth once a day 2)  Lipitor 20 Mg Tabs (Atorvastatin calcium) .... Take 1 tablet by mouth once a day 3)  Benazepril Hcl 20 Mg Tabs (Benazepril hcl) .Marland Kitchen.. 1 by mouth daily 4)  Amlodipine Besylate 5 Mg Tabs (Amlodipine besylate) .Marland Kitchen.. 1 by mouth every day  Other Orders: TLB-A1C / Hgb A1C (Glycohemoglobin) (83036-A1C) TLB-Microalbumin/Creat Ratio, Urine (82043-MALB)  Patient Instructions: 1)  It is important that you exercise reguarly at least 20 minutes 5 times a week. If you develop chest pain, have severe difficulty breathing, or feel very tired, stop exercising immediately and seek medical attention.  2)  You need to lose weight. Consider a lower calorie diet and regular exercise.  3)  get fasting labs    Orders  Added: 1)  Est. Patient 65& > [99397] 2)  UA Dipstick w/o Micro (automated)  [81003] 3)  Hemoccult Guaiac-1 spec.(in office) [82270] 4)  EKG w/ Interpretation [93000] 5)  Venipuncture [36415] 6)  TLB-Lipid Panel [80061-LIPID] 7)  TLB-BMP (Basic Metabolic Panel-BMET) [80048-METABOL] 8)  TLB-CBC Platelet - w/Differential [85025-CBCD] 9)  TLB-Hepatic/Liver Function Pnl [80076-HEPATIC] 10)  TLB-TSH (Thyroid Stimulating Hormone) [84443-TSH] 11)  TLB-A1C / Hgb A1C (Glycohemoglobin) [83036-A1C] 12)  TLB-Microalbumin/Creat Ratio, Urine [82043-MALB] 13)  TLB-PSA (Prostate Specific Antigen) [62130-QMV]  Appended Document: emp--will fast//ccm  Laboratory Results   Urine Tests    Routine Urinalysis   Color: yellow Appearance: Clear Glucose: negative   (Normal Range: Negative) Bilirubin: negative   (Normal Range: Negative) Ketone: negative   (Normal Range: Negative) Spec. Gravity: 1.025   (Normal Range: 1.003-1.035) Blood: negative   (Normal Range: Negative) pH: 5.0   (Normal Range: 5.0-8.0) Protein: negative   (Normal Range: Negative) Urobilinogen: 0.2   (Normal Range: 0-1) Nitrite: negative   (Normal Range: Negative) Leukocyte Esterace: negative   (Normal Range: Negative)    Comments: Rita Ohara  July 28, 2010 2:12 PM

## 2010-10-14 NOTE — Progress Notes (Signed)
Summary: refill  Phone Note From Pharmacy Call back at [phone]   Caller: [facility] [address] Call For: Bobby Lopez  Summary of Call: HCTZ 25 MG Tab qty 30 Sig take 1 tablet daily last filled 05/11/2007 Initial call taken by: Roselle Locus,  June 17, 2007 8:10 AM  Follow-up for Phone Call        Phone call completed, Pharmacist called Follow-up by: Alfred Levins, CMA,  June 17, 2007 8:12 AM      Prescriptions: HYDROCHLOROTHIAZIDE 25 MG  TABS (HYDROCHLOROTHIAZIDE) Take 1 tablet by mouth once a day  #30 x 11   Entered by:   Alfred Levins, CMA   Authorized by:   Nelwyn Salisbury MD   Signed by:   Alfred Levins, CMA on 06/17/2007   Method used:   Electronically sent to ...       [facility]*       [address] [place], [place]  [address]       Ph: [phone]       Fax: [phone]   RxID:   [id number]

## 2010-10-14 NOTE — Assessment & Plan Note (Signed)
Summary: 3 month fup/ccm   Vital Signs:  Patient profile:   70 year old male Weight:      222 pounds BMI:     30.64 BP sitting:   108 / 80  (left arm) Cuff size:   regular  Vitals Entered By: Raechel Ache, RN (Jan 13, 2010 3:31 PM) CC: 3 mo ROV, labs done.   History of Present Illness: Here to follow up on HTN and hyperlipidemia. He feels great, and his BP has been stable. His recent lipid panels were excellent.   Allergies: 1)  ! Ace Inhibitors 2)  ! Hydrocodone  Past History:  Past Medical History: Reviewed history from 06/18/2009 and no changes required. Hypertension Hyperlipidemia Diabetes mellitus, type II ventral hernia, followed by Dr. Alyse Low  Past Surgical History: Reviewed history from 06/25/2008 and no changes required. Cholecystectomy colonoscopy 01-27-06 per Dr. Jarold Motto, repeat in 10 yrs  Review of Systems  The patient denies anorexia, fever, weight loss, weight gain, vision loss, decreased hearing, hoarseness, chest pain, syncope, dyspnea on exertion, peripheral edema, prolonged cough, headaches, hemoptysis, abdominal pain, melena, hematochezia, severe indigestion/heartburn, hematuria, incontinence, genital sores, muscle weakness, suspicious skin lesions, transient blindness, difficulty walking, depression, unusual weight change, abnormal bleeding, enlarged lymph nodes, angioedema, breast masses, and testicular masses.    Physical Exam  General:  Well-developed,well-nourished,in no acute distress; alert,appropriate and cooperative throughout examination Neck:  No deformities, masses, or tenderness noted. Lungs:  Normal respiratory effort, chest expands symmetrically. Lungs are clear to auscultation, no crackles or wheezes. Heart:  Normal rate and regular rhythm. S1 and S2 normal without gallop, murmur, click, rub or other extra sounds.   Impression & Recommendations:  Problem # 1:  HYPERLIPIDEMIA (ICD-272.4)  His updated medication list for this  problem includes:    Lipitor 20 Mg Tabs (Atorvastatin calcium) .Marland Kitchen... Take 1 tablet by mouth once a day  Problem # 2:  HYPERTENSION (ICD-401.9)  His updated medication list for this problem includes:    Hydrochlorothiazide 25 Mg Tabs (Hydrochlorothiazide) .Marland Kitchen... Take 1 tablet by mouth once a day    Benazepril Hcl 20 Mg Tabs (Benazepril hcl) .Marland Kitchen... 1 by mouth daily    Amlodipine Besylate 5 Mg Tabs (Amlodipine besylate) .Marland Kitchen... 1 by mouth every day  Complete Medication List: 1)  Hydrochlorothiazide 25 Mg Tabs (Hydrochlorothiazide) .... Take 1 tablet by mouth once a day 2)  Lipitor 20 Mg Tabs (Atorvastatin calcium) .... Take 1 tablet by mouth once a day 3)  Benazepril Hcl 20 Mg Tabs (Benazepril hcl) .Marland Kitchen.. 1 by mouth daily 4)  Amlodipine Besylate 5 Mg Tabs (Amlodipine besylate) .Marland Kitchen.. 1 by mouth every day  Patient Instructions: 1)  Please schedule a follow-up appointment in 6 months .

## 2011-01-27 ENCOUNTER — Encounter: Payer: Self-pay | Admitting: Family Medicine

## 2011-01-27 ENCOUNTER — Ambulatory Visit (INDEPENDENT_AMBULATORY_CARE_PROVIDER_SITE_OTHER): Payer: 59 | Admitting: Family Medicine

## 2011-01-27 VITALS — BP 102/68 | HR 54 | Temp 98.2°F | Resp 14 | Wt 223.0 lb

## 2011-01-27 DIAGNOSIS — I10 Essential (primary) hypertension: Secondary | ICD-10-CM

## 2011-01-27 DIAGNOSIS — E785 Hyperlipidemia, unspecified: Secondary | ICD-10-CM

## 2011-01-27 DIAGNOSIS — E119 Type 2 diabetes mellitus without complications: Secondary | ICD-10-CM

## 2011-01-27 LAB — LIPID PANEL
Cholesterol: 133 mg/dL (ref 0–200)
HDL: 30.4 mg/dL — ABNORMAL LOW (ref >39.00)
LDL Cholesterol: 72 mg/dL (ref 0–99)
Total CHOL/HDL Ratio: 4
Triglycerides: 153 mg/dL — ABNORMAL HIGH (ref 0.0–149.0)
VLDL: 30.6 mg/dL (ref 0.0–40.0)

## 2011-01-27 LAB — HEMOGLOBIN A1C: Hgb A1c MFr Bld: 7.6 % — ABNORMAL HIGH (ref 4.6–6.5)

## 2011-01-27 LAB — HEPATIC FUNCTION PANEL
ALT: 20 U/L (ref 0–53)
AST: 19 U/L (ref 0–37)
Albumin: 3.6 g/dL (ref 3.5–5.2)
Alkaline Phosphatase: 41 U/L (ref 39–117)
Bilirubin, Direct: 0.1 mg/dL (ref 0.0–0.3)
Total Bilirubin: 0.9 mg/dL (ref 0.3–1.2)
Total Protein: 6.5 g/dL (ref 6.0–8.3)

## 2011-01-27 NOTE — Progress Notes (Signed)
  Subjective:    Patient ID: BENANCIO OSMUNDSON, male    DOB: 1941/05/21, 70 y.o.   MRN: [id number]  HPI Here for follow up and labs. He feels great and has no concerns. Trying to get some exercise. They just got back from another [facility] trip.    Review of Systems  Constitutional: Negative.   Respiratory: Negative.   Cardiovascular: Negative.        Objective:   Physical Exam  Constitutional: He appears well-developed and well-nourished.  Neck: No thyromegaly present.  Cardiovascular: Normal rate, regular rhythm, normal heart sounds and intact distal pulses.   Pulmonary/Chest: Effort normal and breath sounds normal.  Lymphadenopathy:    He has no cervical adenopathy.          Assessment & Plan:  Get fasting labs

## 2011-01-30 MED ORDER — METFORMIN HCL 500 MG PO TABS: 500.0000 mg | ORAL_TABLET | Freq: Two times a day (BID) | ORAL | Status: DC

## 2011-01-30 NOTE — Progress Notes (Signed)
Addended by: Kern Reap on: 01/30/2011 08:17 AM   Modules accepted: Orders

## 2011-01-30 NOTE — Progress Notes (Signed)
patient  Is aware, rx sent, patient will call back for lab appointment

## 2011-01-30 NOTE — Assessment & Plan Note (Signed)
[facility]                              [facility] OFFICE NOTE   MORLEY, GAUMER                      MRN:          [id number]  DATE:06/07/2006                            DOB:          04-24-1941   This is a 70 year old gentleman here for a complete physical examination. He  has no particular complaints at all, and feels good.  He remains active and  walks about 6 miles a day. He had a normal colonoscopy in May of this year  and is not due for another 5 years. We have been following him for  hypertension and hyperlipidemia.  Further details of past medical history,  family history, social history, refer to his last physical note dated  June 01, 2005.  He did have a couple of benign nevi removed from around  the ears and neck in April of this year by Dr. Para Skeans.   ALLERGIES:  ACE INHIBITORS.   CURRENT MEDICATIONS:  1. Hydrochlorothiazide 25 mg daily.  2. Norvasc 10 mg daily.  3. Lipitor 20 mg daily.   OBJECTIVE:  VITAL SIGNS: Height 6 feet, 0 inches, weight 230. Blood pressure  148/86, when I recheck this it was 126/80. Pulse 76 and regular.  GENERAL:  He appears to be doing well.  SKIN:  Free of significant lesions.  HEENT:  Eyes clear, wears glasses. Ears clear. Pharynx clear.  NECK:  Supple without lymphadenopathy or masses.  LUNGS:  Clear.  CARDIAC:  Regular rate and rhythm without gallops, murmurs, rubs. Distal  pulses are full. EKG is within normal limits.  ABDOMEN:  Soft, normal bowel sounds. Nontender, no masses.  GENITALIA:  Normal male.  RECTAL EXAM:  Prostate is mildly enlarged but smooth. Stool Hemoccult  negative.  EXTREMITIES:  No clubbing, cyanosis or edema.  NEUROLOGIC EXAM:  Grossly intact.   Fasting labs on September 13th are all within normal limits. HDL was a  little low at 31 but LDL was excellent at 87.   ASSESSMENT AND PLAN:  1. Complete physical exam. Will repeat this in a year.  2. Hypertension -  stable.  3. Hyperlipidemia - stable.                                   Tera Mater. Clent Ridges, MD  SAF/MedQ  DD:  06/08/2006 DT:  06/09/2006 Job #:  [id number]

## 2011-02-10 ENCOUNTER — Telehealth: Payer: Self-pay | Admitting: *Deleted

## 2011-02-10 NOTE — Telephone Encounter (Signed)
LMTCB

## 2011-02-10 NOTE — Telephone Encounter (Signed)
Pt is having diarrhea from Metformin, and is asking for advice.  Bobby Lopez [employer])

## 2011-02-10 NOTE — Telephone Encounter (Signed)
Avoid greasy or fatty foods. Try taking Align once a day

## 2011-02-11 NOTE — Telephone Encounter (Signed)
Pt given Dr. Fry's recommendations. 

## 2011-03-25 ENCOUNTER — Telehealth: Payer: Self-pay | Admitting: Family Medicine

## 2011-03-25 DIAGNOSIS — E119 Type 2 diabetes mellitus without complications: Secondary | ICD-10-CM

## 2011-03-25 NOTE — Telephone Encounter (Signed)
Pt stated that he was put on a new bp medication and was told when 3 months were up, he would need a lab appt. Please order. Pt knows that Dr Clent Ridges is out this week.

## 2011-03-30 NOTE — Telephone Encounter (Signed)
Set up fasting labs for lipids, hepatic, BMET, and A1c for 250.00

## 2011-03-30 NOTE — Telephone Encounter (Signed)
Pt made an appt for lab work and labs have been entered.

## 2011-04-16 ENCOUNTER — Other Ambulatory Visit (INDEPENDENT_AMBULATORY_CARE_PROVIDER_SITE_OTHER): Payer: 59

## 2011-04-16 DIAGNOSIS — E119 Type 2 diabetes mellitus without complications: Secondary | ICD-10-CM

## 2011-04-16 LAB — LIPID PANEL
Cholesterol: 123 mg/dL (ref 0–200)
HDL: 38.2 mg/dL — ABNORMAL LOW (ref >39.00)
LDL Cholesterol: 62 mg/dL (ref 0–99)
Total CHOL/HDL Ratio: 3
Triglycerides: 114 mg/dL (ref 0.0–149.0)
VLDL: 22.8 mg/dL (ref 0.0–40.0)

## 2011-04-16 LAB — BASIC METABOLIC PANEL
BUN: 17 mg/dL (ref 6–23)
CO2: 27 mEq/L (ref 19–32)
Calcium: 9.1 mg/dL (ref 8.4–10.5)
Chloride: 103 mEq/L (ref 96–112)
Creatinine, Ser: 0.9 mg/dL (ref 0.4–1.5)
GFR: 84.23 mL/min (ref >60.00)
Glucose, Bld: 147 mg/dL — ABNORMAL HIGH (ref 70–99)
Potassium: 4.9 mEq/L (ref 3.5–5.1)
Sodium: 141 mEq/L (ref 135–145)

## 2011-04-16 LAB — HEPATIC FUNCTION PANEL
ALT: 21 U/L (ref 0–53)
AST: 22 U/L (ref 0–37)
Albumin: 4.4 g/dL (ref 3.5–5.2)
Alkaline Phosphatase: 43 U/L (ref 39–117)
Bilirubin, Direct: 0.2 mg/dL (ref 0.0–0.3)
Total Bilirubin: 1.2 mg/dL (ref 0.3–1.2)
Total Protein: 7.4 g/dL (ref 6.0–8.3)

## 2011-04-16 LAB — HEMOGLOBIN A1C: Hgb A1c MFr Bld: 6.6 % — ABNORMAL HIGH (ref 4.6–6.5)

## 2011-04-21 NOTE — Progress Notes (Signed)
Left voice message with normal results. 

## 2011-04-22 ENCOUNTER — Telehealth: Payer: Self-pay | Admitting: Family Medicine

## 2011-04-22 MED ORDER — METFORMIN HCL 500 MG PO TABS: 500.0000 mg | ORAL_TABLET | Freq: Two times a day (BID) | ORAL | Status: DC

## 2011-04-22 NOTE — Telephone Encounter (Signed)
Pt requesting 3 month refill on metFORMIN (GLUCOPHAGE) 500 MG tablet   [facility]

## 2011-04-22 NOTE — Telephone Encounter (Signed)
done

## 2011-05-16 ENCOUNTER — Other Ambulatory Visit: Payer: Self-pay | Admitting: Family Medicine

## 2011-05-19 ENCOUNTER — Other Ambulatory Visit: Payer: Self-pay

## 2011-05-19 MED ORDER — BENAZEPRIL HCL 20 MG PO TABS: 20.0000 mg | ORAL_TABLET | Freq: Every day | ORAL | Status: DC

## 2011-07-24 ENCOUNTER — Telehealth: Payer: Self-pay | Admitting: *Deleted

## 2011-07-24 MED ORDER — ATORVASTATIN CALCIUM 20 MG PO TABS: 20.0000 mg | ORAL_TABLET | Freq: Every day | ORAL | Status: DC

## 2011-07-24 MED ORDER — METFORMIN HCL 500 MG PO TABS: 500.0000 mg | ORAL_TABLET | Freq: Two times a day (BID) | ORAL | Status: DC

## 2011-07-24 NOTE — Telephone Encounter (Signed)
ATORVASTATIN CALCIUM 20MG  TAB #30  Last filled 06/15/11 [facility].  METFORMIN 500MG  TAB  Last filled 06/20/11 [facility].

## 2011-07-24 NOTE — Telephone Encounter (Signed)
rx sent in electronically 

## 2011-07-30 ENCOUNTER — Ambulatory Visit (INDEPENDENT_AMBULATORY_CARE_PROVIDER_SITE_OTHER): Payer: 59 | Admitting: Family Medicine

## 2011-07-30 ENCOUNTER — Encounter: Payer: Self-pay | Admitting: Family Medicine

## 2011-07-30 VITALS — BP 114/80 | HR 60 | Temp 98.4°F | Ht 71.5 in | Wt 215.0 lb

## 2011-07-30 DIAGNOSIS — E119 Type 2 diabetes mellitus without complications: Secondary | ICD-10-CM

## 2011-07-30 DIAGNOSIS — Z23 Encounter for immunization: Secondary | ICD-10-CM

## 2011-07-30 DIAGNOSIS — Z0000 Encounter for general adult medical examination without abnormal findings: Secondary | ICD-10-CM

## 2011-07-30 LAB — CBC WITH DIFFERENTIAL/PLATELET
Basophils Absolute: 0 10*3/uL (ref 0.0–0.1)
Basophils Relative: 0.5 % (ref 0.0–3.0)
Eosinophils Absolute: 0.2 10*3/uL (ref 0.0–0.7)
Eosinophils Relative: 2.3 % (ref 0.0–5.0)
HCT: 45.5 % (ref 39.0–52.0)
Hemoglobin: 15.3 g/dL (ref 13.0–17.0)
Lymphocytes Relative: 24.8 % (ref 12.0–46.0)
Lymphs Abs: 2 10*3/uL (ref 0.7–4.0)
MCHC: 33.7 g/dL (ref 30.0–36.0)
MCV: 91.9 fl (ref 78.0–100.0)
Monocytes Absolute: 0.7 10*3/uL (ref 0.1–1.0)
Monocytes Relative: 8.8 % (ref 3.0–12.0)
Neutro Abs: 5.2 10*3/uL (ref 1.4–7.7)
Neutrophils Relative %: 63.6 % (ref 43.0–77.0)
Platelets: 177 10*3/uL (ref 150.0–400.0)
RBC: 4.95 Mil/uL (ref 4.22–5.81)
RDW: 13.2 % (ref 11.5–14.6)
WBC: 8.1 10*3/uL (ref 4.5–10.5)

## 2011-07-30 LAB — LIPID PANEL
Cholesterol: 120 mg/dL (ref 0–200)
HDL: 35.1 mg/dL — ABNORMAL LOW (ref >39.00)
LDL Cholesterol: 68 mg/dL (ref 0–99)
Total CHOL/HDL Ratio: 3
Triglycerides: 86 mg/dL (ref 0.0–149.0)
VLDL: 17.2 mg/dL (ref 0.0–40.0)

## 2011-07-30 LAB — BASIC METABOLIC PANEL
BUN: 16 mg/dL (ref 6–23)
CO2: 31 mEq/L (ref 19–32)
Calcium: 9.7 mg/dL (ref 8.4–10.5)
Chloride: 106 mEq/L (ref 96–112)
Creatinine, Ser: 1 mg/dL (ref 0.4–1.5)
GFR: 76.59 mL/min (ref >60.00)
Glucose, Bld: 134 mg/dL — ABNORMAL HIGH (ref 70–99)
Potassium: 5.3 mEq/L — ABNORMAL HIGH (ref 3.5–5.1)
Sodium: 142 mEq/L (ref 135–145)

## 2011-07-30 LAB — HEPATIC FUNCTION PANEL
ALT: 23 U/L (ref 0–53)
AST: 22 U/L (ref 0–37)
Albumin: 4.1 g/dL (ref 3.5–5.2)
Alkaline Phosphatase: 49 U/L (ref 39–117)
Bilirubin, Direct: 0.1 mg/dL (ref 0.0–0.3)
Total Bilirubin: 1.1 mg/dL (ref 0.3–1.2)
Total Protein: 7.2 g/dL (ref 6.0–8.3)

## 2011-07-30 LAB — POCT URINALYSIS DIPSTICK
Bilirubin, UA: NEGATIVE
Blood, UA: NEGATIVE
Glucose, UA: NEGATIVE
Ketones, UA: NEGATIVE
Leukocytes, UA: NEGATIVE
Nitrite, UA: NEGATIVE
Protein, UA: NEGATIVE
Spec Grav, UA: 1.025
Urobilinogen, UA: 0.2
pH, UA: 5.5

## 2011-07-30 LAB — HEMOGLOBIN A1C: Hgb A1c MFr Bld: 6.5 % (ref 4.6–6.5)

## 2011-07-30 MED ORDER — AMLODIPINE BESYLATE 5 MG PO TABS: 5.0000 mg | ORAL_TABLET | Freq: Every day | ORAL | Status: DC

## 2011-07-30 MED ORDER — METFORMIN HCL 500 MG PO TABS: 500.0000 mg | ORAL_TABLET | Freq: Two times a day (BID) | ORAL | Status: DC

## 2011-07-30 MED ORDER — BENAZEPRIL HCL 20 MG PO TABS: 20.0000 mg | ORAL_TABLET | Freq: Every day | ORAL | Status: DC

## 2011-07-30 MED ORDER — PNEUMOCOCCAL VAC POLYVALENT 25 MCG/0.5ML IJ INJ: 0.5000 mL | INJECTION | Freq: Once | INTRAMUSCULAR | Status: DC

## 2011-07-30 MED ORDER — HYDROCHLOROTHIAZIDE 25 MG PO TABS: 25.0000 mg | ORAL_TABLET | Freq: Every day | ORAL | Status: DC

## 2011-07-30 MED ORDER — ATORVASTATIN CALCIUM 20 MG PO TABS: 20.0000 mg | ORAL_TABLET | Freq: Every day | ORAL | Status: DC

## 2011-07-30 NOTE — Progress Notes (Signed)
  Subjective:    Patient ID: Bobby Lopez, male    DOB: 1941/01/21, 70 y.o.   MRN: [id number]  HPI 70 yr old male for a cpx. He is doing well in general.    Review of Systems  Constitutional: Negative.   HENT: Negative.   Eyes: Negative.   Respiratory: Negative.   Cardiovascular: Negative.   Gastrointestinal: Negative.   Genitourinary: Negative.   Musculoskeletal: Negative.   Skin: Negative.   Neurological: Negative.   Hematological: Negative.   Psychiatric/Behavioral: Negative.        Objective:   Physical Exam  Constitutional: He is oriented to person, place, and time. He appears well-developed and well-nourished. No distress.  HENT:  Head: Normocephalic and atraumatic.  Nose: Nose normal.  Mouth/Throat: Oropharynx is clear and moist. No oropharyngeal exudate.       Both ear canals are full of cerumen   Eyes: Conjunctivae and EOM are normal. Pupils are equal, round, and reactive to light. Right eye exhibits no discharge. Left eye exhibits no discharge. No scleral icterus.  Neck: Neck supple. No JVD present. No tracheal deviation present. No thyromegaly present.  Cardiovascular: Normal rate, regular rhythm, normal heart sounds and intact distal pulses.  Exam reveals no gallop and no friction rub.   No murmur heard.      EKG normal   Pulmonary/Chest: Effort normal and breath sounds normal. No respiratory distress. He has no wheezes. He has no rales. He exhibits no tenderness.  Abdominal: Soft. Bowel sounds are normal. He exhibits no distension and no mass. There is no tenderness. There is no rebound and no guarding.  Genitourinary: Rectum normal, prostate normal and penis normal. Guaiac negative stool. No penile tenderness.  Musculoskeletal: Normal range of motion. He exhibits no edema and no tenderness.  Lymphadenopathy:    He has no cervical adenopathy.  Neurological: He is alert and oriented to person, place, and time. He has normal reflexes. No cranial nerve deficit. He  exhibits normal muscle tone. Coordination normal.  Skin: Skin is warm and dry. No rash noted. He is not diaphoretic. No erythema. No pallor.  Psychiatric: He has a normal mood and affect. His behavior is normal. Judgment and thought content normal.          Assessment & Plan:  Both ears were irrigated clear with water. Get fasting labs.

## 2011-07-30 NOTE — Progress Notes (Signed)
Addended by: Aniceto Boss A on: 07/30/2011 12:31 PM   Modules accepted: Orders

## 2011-07-31 LAB — TSH: TSH: 2.29 u[IU]/mL (ref 0.35–5.50)

## 2011-07-31 LAB — PSA: PSA: 1.82 ng/mL (ref 0.10–4.00)

## 2011-08-03 NOTE — Progress Notes (Signed)
Quick Note:  Pt informed ______ 

## 2011-12-24 ENCOUNTER — Telehealth: Payer: Self-pay | Admitting: Family Medicine

## 2011-12-24 DIAGNOSIS — E785 Hyperlipidemia, unspecified: Secondary | ICD-10-CM

## 2011-12-24 DIAGNOSIS — E119 Type 2 diabetes mellitus without complications: Secondary | ICD-10-CM

## 2011-12-24 NOTE — Telephone Encounter (Signed)
Called pt and schd labs for 01/28/12 and 6 month fup on 02/03/12 as noted.

## 2011-12-24 NOTE — Telephone Encounter (Signed)
I put future lab orders in computer. Can you call to schedule lab draw and it is a fasting lab draw.

## 2011-12-24 NOTE — Telephone Encounter (Signed)
He is asking for labs to be drawn prior to his 6 month follow up visit. Set up fasting lipids and liver panel for 272.4, and also an A1c for 250.00

## 2012-01-12 DIAGNOSIS — E119 Type 2 diabetes mellitus without complications: Secondary | ICD-10-CM | POA: Diagnosis not present

## 2012-01-28 ENCOUNTER — Other Ambulatory Visit (INDEPENDENT_AMBULATORY_CARE_PROVIDER_SITE_OTHER): Payer: 59

## 2012-01-28 DIAGNOSIS — E785 Hyperlipidemia, unspecified: Secondary | ICD-10-CM

## 2012-01-28 DIAGNOSIS — E119 Type 2 diabetes mellitus without complications: Secondary | ICD-10-CM | POA: Diagnosis not present

## 2012-01-28 LAB — HEMOGLOBIN A1C: Hgb A1c MFr Bld: 6.8 % — ABNORMAL HIGH (ref 4.6–6.5)

## 2012-01-28 LAB — HEPATIC FUNCTION PANEL
ALT: 22 U/L (ref 0–53)
AST: 19 U/L (ref 0–37)
Albumin: 4.1 g/dL (ref 3.5–5.2)
Alkaline Phosphatase: 44 U/L (ref 39–117)
Bilirubin, Direct: 0 mg/dL (ref 0.0–0.3)
Total Bilirubin: 1 mg/dL (ref 0.3–1.2)
Total Protein: 7.2 g/dL (ref 6.0–8.3)

## 2012-01-28 LAB — LIPID PANEL
Cholesterol: 116 mg/dL (ref 0–200)
HDL: 37.2 mg/dL — ABNORMAL LOW (ref >39.00)
LDL Cholesterol: 58 mg/dL (ref 0–99)
Total CHOL/HDL Ratio: 3
Triglycerides: 104 mg/dL (ref 0.0–149.0)
VLDL: 20.8 mg/dL (ref 0.0–40.0)

## 2012-01-29 NOTE — Progress Notes (Signed)
Quick Note:  Spoke with pt ______ 

## 2012-02-03 ENCOUNTER — Ambulatory Visit (INDEPENDENT_AMBULATORY_CARE_PROVIDER_SITE_OTHER): Payer: 59 | Admitting: Family Medicine

## 2012-02-03 ENCOUNTER — Encounter: Payer: Self-pay | Admitting: Family Medicine

## 2012-02-03 VITALS — BP 118/78 | HR 74 | Temp 98.6°F | Wt 214.0 lb

## 2012-02-03 DIAGNOSIS — R05 Cough: Secondary | ICD-10-CM

## 2012-02-03 DIAGNOSIS — E119 Type 2 diabetes mellitus without complications: Secondary | ICD-10-CM

## 2012-02-03 DIAGNOSIS — I10 Essential (primary) hypertension: Secondary | ICD-10-CM

## 2012-02-03 DIAGNOSIS — H6120 Impacted cerumen, unspecified ear: Secondary | ICD-10-CM

## 2012-02-03 DIAGNOSIS — E785 Hyperlipidemia, unspecified: Secondary | ICD-10-CM

## 2012-02-03 DIAGNOSIS — R059 Cough, unspecified: Secondary | ICD-10-CM | POA: Diagnosis not present

## 2012-02-03 DIAGNOSIS — T464X5A Adverse effect of angiotensin-converting-enzyme inhibitors, initial encounter: Secondary | ICD-10-CM

## 2012-02-03 NOTE — Progress Notes (Signed)
  Subjective:    Patient ID: Bobby Lopez, male    DOB: [birth year], 70 y.o.   MRN: [id number]  HPI Here for follow up. His recent labs for lipids and diabetes were excellent. He still has a dry hacky cough that will not go away. He also feels like his ears are stopped up.    Review of Systems  Constitutional: Negative.   Respiratory: Positive for cough. Negative for choking, chest tightness, shortness of breath, wheezing and stridor.   Cardiovascular: Negative.        Objective:   Physical Exam  Constitutional: He appears well-developed and well-nourished.  HENT:       Both ear canals are full of cerumen   Neck: No thyromegaly present.  Cardiovascular: Normal rate, regular rhythm, normal heart sounds and intact distal pulses.   Pulmonary/Chest: Effort normal and breath sounds normal.  Lymphadenopathy:    He has no cervical adenopathy.          Assessment & Plan:  He is doing well in general. His cough is probably related to his ACE inhibitor, so we will switch from Benazepril to Losartan HCT. His ears were irrigated clear with water

## 2012-02-11 ENCOUNTER — Telehealth: Payer: Self-pay | Admitting: Family Medicine

## 2012-02-11 NOTE — Telephone Encounter (Signed)
Stop the Benazepril and start on Losartan HCT 50/12.5 daily. Call in one year supply

## 2012-02-11 NOTE — Telephone Encounter (Signed)
Pt was here last week and needed refills on his blood pressure medication, said that there might be a change?

## 2012-02-12 ENCOUNTER — Other Ambulatory Visit: Payer: Self-pay | Admitting: Family Medicine

## 2012-02-12 MED ORDER — LOSARTAN POTASSIUM-HCTZ 50-12.5 MG PO TABS: 1.0000 | ORAL_TABLET | Freq: Every day | ORAL | Status: DC

## 2012-02-12 NOTE — Telephone Encounter (Signed)
I sent script e-scribe and spoke with pt. 

## 2012-02-23 ENCOUNTER — Telehealth: Payer: Self-pay | Admitting: Family Medicine

## 2012-02-23 NOTE — Telephone Encounter (Signed)
Pts spouse called and said that Dr Clent Ridges changed one of pts med and a 1 month supply was called in. Req to change to a 90 day supply of losartan-hydrochlorothiazide (HYZAAR) 50-12.5 MG per tablet to [facility] [address]. Pls call and correct this with pharmacist. For some reason the pharmacy only gave pt #30.

## 2012-02-24 MED ORDER — LOSARTAN POTASSIUM-HCTZ 50-12.5 MG PO TABS: 1.0000 | ORAL_TABLET | Freq: Every day | ORAL | Status: DC

## 2012-02-24 NOTE — Telephone Encounter (Signed)
I resent script e-scribe for a 90 day supply.

## 2012-04-18 ENCOUNTER — Ambulatory Visit (INDEPENDENT_AMBULATORY_CARE_PROVIDER_SITE_OTHER): Payer: Medicare Other | Admitting: Family Medicine

## 2012-04-18 ENCOUNTER — Encounter: Payer: Self-pay | Admitting: Family Medicine

## 2012-04-18 VITALS — BP 110/70 | HR 71 | Temp 98.4°F | Wt 215.0 lb

## 2012-04-18 DIAGNOSIS — L301 Dyshidrosis [pompholyx]: Secondary | ICD-10-CM | POA: Diagnosis not present

## 2012-04-18 MED ORDER — HALOBETASOL PROPIONATE 0.05 % EX CREA: TOPICAL_CREAM | Freq: Two times a day (BID) | CUTANEOUS | Status: DC

## 2012-04-18 NOTE — Progress Notes (Signed)
  Subjective:    Patient ID: Bobby Lopez, male    DOB: 1940/12/03, 71 y.o.   MRN: [id number]  HPI Here to ask about a tender rash he has had between the fingers on both hands for about 3 months. Using OTC moisturizers.    Review of Systems  Constitutional: Negative.   Skin: Positive for rash.       Objective:   Physical Exam  Constitutional: He appears well-developed and well-nourished.  Skin:       The skin between most of the fingers on both hands is scaly, red, and dry          Assessment & Plan:  Use Halobetasol cream prn

## 2012-04-28 ENCOUNTER — Other Ambulatory Visit: Payer: Self-pay | Admitting: Family Medicine

## 2012-04-28 NOTE — Telephone Encounter (Signed)
Please advise - I see different meds in current list

## 2012-04-29 NOTE — Telephone Encounter (Signed)
He is no longer on either of these meds

## 2012-04-29 NOTE — Telephone Encounter (Signed)
I dont see these meds on current med list - please advise

## 2012-04-29 NOTE — Telephone Encounter (Signed)
Refill both for one year. 

## 2012-05-27 ENCOUNTER — Other Ambulatory Visit: Payer: Self-pay | Admitting: Family Medicine

## 2012-07-25 ENCOUNTER — Other Ambulatory Visit (INDEPENDENT_AMBULATORY_CARE_PROVIDER_SITE_OTHER): Payer: Medicare Other

## 2012-07-25 DIAGNOSIS — D649 Anemia, unspecified: Secondary | ICD-10-CM

## 2012-07-25 DIAGNOSIS — E039 Hypothyroidism, unspecified: Secondary | ICD-10-CM | POA: Diagnosis not present

## 2012-07-25 DIAGNOSIS — E785 Hyperlipidemia, unspecified: Secondary | ICD-10-CM | POA: Diagnosis not present

## 2012-07-25 DIAGNOSIS — E119 Type 2 diabetes mellitus without complications: Secondary | ICD-10-CM | POA: Diagnosis not present

## 2012-07-25 DIAGNOSIS — N410 Acute prostatitis: Secondary | ICD-10-CM

## 2012-07-25 DIAGNOSIS — N400 Enlarged prostate without lower urinary tract symptoms: Secondary | ICD-10-CM | POA: Diagnosis not present

## 2012-07-25 DIAGNOSIS — Z0000 Encounter for general adult medical examination without abnormal findings: Secondary | ICD-10-CM | POA: Diagnosis not present

## 2012-07-25 DIAGNOSIS — I10 Essential (primary) hypertension: Secondary | ICD-10-CM | POA: Diagnosis not present

## 2012-07-25 LAB — PSA: PSA: 1.55 ng/mL (ref 0.10–4.00)

## 2012-07-25 LAB — BASIC METABOLIC PANEL
BUN: 15 mg/dL (ref 6–23)
CO2: 30 mEq/L (ref 19–32)
Calcium: 9.5 mg/dL (ref 8.4–10.5)
Chloride: 103 mEq/L (ref 96–112)
Creatinine, Ser: 1 mg/dL (ref 0.4–1.5)
GFR: 78.14 mL/min (ref >60.00)
Glucose, Bld: 165 mg/dL — ABNORMAL HIGH (ref 70–99)
Potassium: 5.1 mEq/L (ref 3.5–5.1)
Sodium: 139 mEq/L (ref 135–145)

## 2012-07-25 LAB — HEPATIC FUNCTION PANEL
ALT: 24 U/L (ref 0–53)
AST: 19 U/L (ref 0–37)
Albumin: 3.9 g/dL (ref 3.5–5.2)
Alkaline Phosphatase: 44 U/L (ref 39–117)
Bilirubin, Direct: 0.2 mg/dL (ref 0.0–0.3)
Total Bilirubin: 0.9 mg/dL (ref 0.3–1.2)
Total Protein: 6.9 g/dL (ref 6.0–8.3)

## 2012-07-25 LAB — LIPID PANEL
Cholesterol: 125 mg/dL (ref 0–200)
HDL: 32.4 mg/dL — ABNORMAL LOW (ref >39.00)
LDL Cholesterol: 65 mg/dL (ref 0–99)
Total CHOL/HDL Ratio: 4
Triglycerides: 140 mg/dL (ref 0.0–149.0)
VLDL: 28 mg/dL (ref 0.0–40.0)

## 2012-07-25 LAB — HEMOGLOBIN A1C: Hgb A1c MFr Bld: 6.7 % — ABNORMAL HIGH (ref 4.6–6.5)

## 2012-07-25 LAB — CBC WITH DIFFERENTIAL/PLATELET
Basophils Absolute: 0 10*3/uL (ref 0.0–0.1)
Basophils Relative: 0.3 % (ref 0.0–3.0)
Eosinophils Absolute: 0.1 10*3/uL (ref 0.0–0.7)
Eosinophils Relative: 1.5 % (ref 0.0–5.0)
HCT: 45 % (ref 39.0–52.0)
Hemoglobin: 15.2 g/dL (ref 13.0–17.0)
Lymphocytes Relative: 32.2 % (ref 12.0–46.0)
Lymphs Abs: 2.4 10*3/uL (ref 0.7–4.0)
MCHC: 33.8 g/dL (ref 30.0–36.0)
MCV: 90.6 fl (ref 78.0–100.0)
Monocytes Absolute: 0.7 10*3/uL (ref 0.1–1.0)
Monocytes Relative: 9.1 % (ref 3.0–12.0)
Neutro Abs: 4.2 10*3/uL (ref 1.4–7.7)
Neutrophils Relative %: 56.9 % (ref 43.0–77.0)
Platelets: 173 10*3/uL (ref 150.0–400.0)
RBC: 4.96 Mil/uL (ref 4.22–5.81)
RDW: 13.3 % (ref 11.5–14.6)
WBC: 7.3 10*3/uL (ref 4.5–10.5)

## 2012-07-25 LAB — POCT URINALYSIS DIPSTICK
Bilirubin, UA: NEGATIVE
Blood, UA: NEGATIVE
Glucose, UA: NEGATIVE
Ketones, UA: NEGATIVE
Leukocytes, UA: NEGATIVE
Nitrite, UA: NEGATIVE
Protein, UA: NEGATIVE
Spec Grav, UA: 1.025
Urobilinogen, UA: 0.2
pH, UA: 5

## 2012-07-25 LAB — MICROALBUMIN / CREATININE URINE RATIO
Creatinine,U: 233.6 mg/dL
Microalb Creat Ratio: 0.6 mg/g (ref 0.0–30.0)
Microalb, Ur: 1.5 mg/dL (ref 0.0–1.9)

## 2012-07-25 LAB — TSH: TSH: 2.57 u[IU]/mL (ref 0.35–5.50)

## 2012-07-25 NOTE — Progress Notes (Signed)
Quick Note:  I spoke with pt ______ 

## 2012-07-26 ENCOUNTER — Other Ambulatory Visit: Payer: Self-pay | Admitting: Family Medicine

## 2012-08-01 ENCOUNTER — Emergency Department (HOSPITAL_COMMUNITY): Payer: BC Managed Care – PPO

## 2012-08-01 ENCOUNTER — Emergency Department (HOSPITAL_COMMUNITY)
Admission: EM | Admit: 2012-08-01 | Discharge: 2012-08-01 | Disposition: A | Discharge status: Home / Self Care | Payer: BC Managed Care – PPO | Attending: Emergency Medicine | Admitting: Emergency Medicine

## 2012-08-01 ENCOUNTER — Encounter (HOSPITAL_COMMUNITY): Payer: Self-pay | Admitting: *Deleted

## 2012-08-01 ENCOUNTER — Telehealth: Payer: Self-pay | Admitting: Family Medicine

## 2012-08-01 DIAGNOSIS — Z87891 Personal history of nicotine dependence: Secondary | ICD-10-CM | POA: Insufficient documentation

## 2012-08-01 DIAGNOSIS — K429 Umbilical hernia without obstruction or gangrene: Secondary | ICD-10-CM | POA: Diagnosis not present

## 2012-08-01 DIAGNOSIS — E119 Type 2 diabetes mellitus without complications: Secondary | ICD-10-CM | POA: Diagnosis not present

## 2012-08-01 DIAGNOSIS — R109 Unspecified abdominal pain: Secondary | ICD-10-CM | POA: Insufficient documentation

## 2012-08-01 DIAGNOSIS — N281 Cyst of kidney, acquired: Secondary | ICD-10-CM | POA: Diagnosis not present

## 2012-08-01 DIAGNOSIS — I10 Essential (primary) hypertension: Secondary | ICD-10-CM | POA: Diagnosis not present

## 2012-08-01 DIAGNOSIS — R1030 Lower abdominal pain, unspecified: Secondary | ICD-10-CM

## 2012-08-01 DIAGNOSIS — K439 Ventral hernia without obstruction or gangrene: Secondary | ICD-10-CM | POA: Diagnosis not present

## 2012-08-01 DIAGNOSIS — E785 Hyperlipidemia, unspecified: Secondary | ICD-10-CM | POA: Diagnosis not present

## 2012-08-01 DIAGNOSIS — N433 Hydrocele, unspecified: Secondary | ICD-10-CM | POA: Diagnosis not present

## 2012-08-01 DIAGNOSIS — Z79899 Other long term (current) drug therapy: Secondary | ICD-10-CM | POA: Insufficient documentation

## 2012-08-01 LAB — URINALYSIS, ROUTINE W REFLEX MICROSCOPIC
Bilirubin Urine: NEGATIVE
Glucose, UA: NEGATIVE mg/dL
Hgb urine dipstick: NEGATIVE
Ketones, ur: NEGATIVE mg/dL
Leukocytes, UA: NEGATIVE
Nitrite: NEGATIVE
Protein, ur: NEGATIVE mg/dL
Specific Gravity, Urine: 1.021 (ref 1.005–1.030)
Urobilinogen, UA: 0.2 mg/dL (ref 0.0–1.0)
pH: 5 (ref 5.0–8.0)

## 2012-08-01 MED ORDER — MORPHINE SULFATE 4 MG/ML IJ SOLN
4.0000 mg | Freq: Once | INTRAMUSCULAR | Status: AC
  Administered 2012-08-01: 4 mg via INTRAVENOUS
  Filled 2012-08-01: qty 1

## 2012-08-01 MED ORDER — TRAMADOL HCL 50 MG PO TABS: 50.0000 mg | ORAL_TABLET | Freq: Four times a day (QID) | ORAL | Status: DC | PRN

## 2012-08-01 NOTE — Telephone Encounter (Signed)
Patient Information:  Caller Name: Dennie Bible  Phone: [phone]  Patient: Bobby Lopez, Bobby Lopez  Gender: Male  DOB: Apr 29, 1941  Age: 71 Years  PCP: Gershon Crane [facility])   Symptoms  Reason For Call & Symptoms: severe left hip and groin and back pain this am, was unable to get out of bed on his on this morning  Reviewed Health History In EMR: Yes  Reviewed Medications In EMR: Yes  Reviewed Allergies In EMR: Yes  Date of Onset of Symptoms: 08/01/2012  Guideline(s) Used:  Back Pain  Disposition Per Guideline:   Go to ED Now (or to Office with PCP Approval)  Reason For Disposition Reached:   Pain radiates into groin, scrotum  Advice Given:  N/A  Office Follow Up:  Does the office need to follow up with this patient?: No  Instructions For The Office: N/A  RN Note:  Sent to [facility] ED for evaluation

## 2012-08-01 NOTE — ED Provider Notes (Signed)
History     CSN: [id number]  Arrival date & time 08/01/12  1234   First MD Initiated Contact with Patient 08/01/12 1357      Chief Complaint  Patient presents with  . Groin Pain    (Consider location/radiation/quality/duration/timing/severity/associated sxs/prior treatment) HPI Comments: This is a 71 year old male, who presents to the emergency department with chief complaint of left groin pain since this morning. He states that he was doing housework, when he first noticed the pain. He denies any heavy lifting. His past medical history remarkable for hypertension, hyperlipidemia, diabetes mellitus, and ventral hernia. Patient denies dysuria, or unusual penile discharge. Patient states that his pain was 10 out of 10, but is now 6/10. He states it radiates to his left flank. He notes no blood in the toilet or with urination. He denies chest pain, shortness of breath, nausea, vomiting, diarrhea, constipation.  The history is provided by the patient. No language interpreter was used.    Past Medical History  Diagnosis Date  . Hypertension   . Hyperlipidemia   . Diabetes mellitus     type  II  . Ventral hernia     followed by Dr. Alyse Low    Past Surgical History  Procedure Date  . Cholecystectomy   . Colonoscopy 01-27-06    per Dr. Jarold Motto, repeat in 10 yrs    Family History  Problem Relation Age of Onset  . Diabetes Mother   . Hypertension Mother     History  Substance Use Topics  . Smoking status: Former Games developer  . Smokeless tobacco: Never Used  . Alcohol Use: No      Review of Systems  All other systems reviewed and are negative.    Allergies  Ace inhibitors and Hydrocodone  Home Medications   Current Outpatient Rx  Name  Route  Sig  Dispense  Refill  . AMLODIPINE BESYLATE 5 MG PO TABS   Oral   Take 5 mg by mouth daily.         . ATORVASTATIN CALCIUM 20 MG PO TABS   Oral   Take 20 mg by mouth every evening.         Marland Kitchen OMEGA-3 FATTY ACIDS  1000 MG PO CAPS   Oral   Take 1 g by mouth daily.           Marland Kitchen GARLIC PO TABS   Oral   Take 1 each by mouth daily.          Marland Kitchen LOSARTAN POTASSIUM-HCTZ 50-12.5 MG PO TABS   Oral   Take 1 tablet by mouth daily.   90 tablet   3   . METFORMIN HCL 500 MG PO TABS   Oral   Take 500 mg by mouth 2 (two) times daily with a meal.         . ADULT MULTIVITAMIN W/MINERALS CH   Oral   Take 1 tablet by mouth daily.         Marland Kitchen OVER THE COUNTER MEDICATION   Oral   Take 1 tablet by mouth daily. Prostate health vitamin         . METFORMIN HCL 500 MG PO TABS   Oral   Take 1 tablet (500 mg total) by mouth 2 (two) times daily with a meal.   180 tablet   3     BP 115/68  Pulse 69  Temp 97.6 F (36.4 C) (Oral)  Resp 18  SpO2 97%  Physical Exam  Nursing note  and vitals reviewed. Constitutional: He is oriented to person, place, and time. He appears well-developed and well-nourished.  HENT:  Head: Normocephalic and atraumatic.  Eyes: Conjunctivae normal and EOM are normal. Pupils are equal, round, and reactive to light.  Neck: Normal range of motion. Neck supple.  Cardiovascular: Normal rate, regular rhythm and normal heart sounds.   Pulmonary/Chest: Effort normal and breath sounds normal. No respiratory distress. He has no wheezes. He has no rales. He exhibits no tenderness.  Abdominal: Soft. Bowel sounds are normal.  Genitourinary: Penis normal.       No masses, bulges, or herniations. No signs of swelling. No tenderness of the testes.  Musculoskeletal: Normal range of motion.  Neurological: He is alert and oriented to person, place, and time.  Skin: Skin is warm and dry.  Psychiatric: He has a normal mood and affect. His behavior is normal. Judgment and thought content normal.    ED Course  Procedures (including critical care time)   Labs Reviewed  URINALYSIS, ROUTINE W REFLEX MICROSCOPIC   Results for orders placed during the hospital encounter of 08/01/12    URINALYSIS, ROUTINE W REFLEX MICROSCOPIC      Component Value Range   Color, Urine YELLOW  YELLOW   APPearance CLEAR  CLEAR   Specific Gravity, Urine 1.021  1.005 - 1.030   pH 5.0  5.0 - 8.0   Glucose, UA NEGATIVE  NEGATIVE mg/dL   Hgb urine dipstick NEGATIVE  NEGATIVE   Bilirubin Urine NEGATIVE  NEGATIVE   Ketones, ur NEGATIVE  NEGATIVE mg/dL   Protein, ur NEGATIVE  NEGATIVE mg/dL   Urobilinogen, UA 0.2  0.0 - 1.0 mg/dL   Nitrite NEGATIVE  NEGATIVE   Leukocytes, UA NEGATIVE  NEGATIVE   Ct Abdomen Pelvis Wo Contrast  08/01/2012  *RADIOLOGY REPORT*  Clinical Data: Left groin pain, history hypertension, diabetes, ventral hernia  CT ABDOMEN AND PELVIS WITHOUT CONTRAST  Technique:  Multidetector CT imaging of the abdomen and pelvis was performed following the standard protocol without intravenous contrast. Sagittal and coronal MPR images reconstructed from axial data set.  Comparison: 01/23/2009  Findings: Lung bases clear. Surgical clips from prior cholecystectomy. Small left peripelvic renal cysts. Malrotated left kidney, hilum directed anteriorly. Within limits of a nonenhanced exam, liver, spleen, pancreas, kidneys, and adrenal glands otherwise unremarkable. Ventral hernia containing fat, fascial defect 2.6 cm diameter supraumbilical image 35. Normal appendix.  Question prior vasectomy. Unremarkable bladder and ureters. Minimal enlargement of prostate gland, 4.7 x 3.9 cm image 78. No inguinal hernia seen. Stomach and bowel loops grossly normal appearance for technique. No mass, adenopathy, free fluid or inflammatory process.  IMPRESSION: Small supraumbilical ventral hernia containing fat. Left peripelvic renal cysts. No acute intra-abdominal or intrapelvic abnormalities.   Original Report Authenticated By: Ulyses Southward, M.D.    US Scrotum  08/01/2012  *RADIOLOGY REPORT*  Clinical Data:  Groin pain.  SCROTAL ULTRASOUND DOPPLER ULTRASOUND OF THE TESTICLES  Technique: Complete ultrasound  examination of the testicles, epididymis, and other scrotal structures was performed.  Color and spectral Doppler ultrasound were also utilized to evaluate blood flow to the testicles.  Comparison:  None  Findings:  Right testis:  Measures 3.6 x 2.2 x 3.1cm.  Normal homogeneous echogenicity without focal lesions.  Patent intratesticular blood flow  Left testis:  Measures 3.6 x 2.0 x 2.9cm.  Normal homogeneous echogenicity without focal lesions.  Patent intratesticular blood flow  Right epididymis:  Small epididymal cysts are noted.  Left epididymis:  Normal in size  and appearance.  Hydrocele:  A right-sided hydrocele is present.  Varicocele:  None.  Pulsed Doppler interrogation of both testes demonstrates low resistance flow bilaterally.  IMPRESSION: Normal scrotal ultrasound examination.   Original Report Authenticated By: Rudie Meyer, M.D.    [place] Art/ven Flow Abd Pelv Doppler  08/01/2012  *RADIOLOGY REPORT*  Clinical Data:  Groin pain.  SCROTAL ULTRASOUND DOPPLER ULTRASOUND OF THE TESTICLES  Technique: Complete ultrasound examination of the testicles, epididymis, and other scrotal structures was performed.  Color and spectral Doppler ultrasound were also utilized to evaluate blood flow to the testicles.  Comparison:  None  Findings:  Right testis:  Measures 3.6 x 2.2 x 3.1cm.  Normal homogeneous echogenicity without focal lesions.  Patent intratesticular blood flow  Left testis:  Measures 3.6 x 2.0 x 2.9cm.  Normal homogeneous echogenicity without focal lesions.  Patent intratesticular blood flow  Right epididymis:  Small epididymal cysts are noted.  Left epididymis:  Normal in size and appearance.  Hydrocele:  A right-sided hydrocele is present.  Varicocele:  None.  Pulsed Doppler interrogation of both testes demonstrates low resistance flow bilaterally.  IMPRESSION: Normal scrotal ultrasound examination.   Original Report Authenticated By: Rudie Meyer, M.D.        1. Groin pain       MDM  4:50  PM Patient reevaluated, states the pain is improved.  5:54 PM Ultrasound was negative, patient's wife is that because she believes that he has a kidney stone. I explained to where he had a complaint of testicular pain, I needed to rule out torsion/incarcerated hernia, I'm going to get a CT abdomen pelvis.  I discussed this patient with Dr. Preston Fleeting. I'm going to send the patient home with PCP and urology followup. He has a PCP appointment tomorrow. Patient is stable and ready for discharge.        Roxy Horseman, PA-C 08/01/12 1828

## 2012-08-01 NOTE — ED Notes (Signed)
PT states at 0930 had left groin pain that went through to back and could not stand on his leg.  No testicle or scrotal pain.  Pulses present in lower extremities. Limps with walking

## 2012-08-01 NOTE — ED Notes (Signed)
Patient transported to Ultrasound 

## 2012-08-02 ENCOUNTER — Ambulatory Visit (INDEPENDENT_AMBULATORY_CARE_PROVIDER_SITE_OTHER): Payer: Medicare Other | Admitting: Family Medicine

## 2012-08-02 ENCOUNTER — Encounter: Payer: Self-pay | Admitting: Family Medicine

## 2012-08-02 VITALS — BP 114/70 | HR 83 | Temp 98.4°F | Ht 71.25 in | Wt 210.0 lb

## 2012-08-02 DIAGNOSIS — Z0000 Encounter for general adult medical examination without abnormal findings: Secondary | ICD-10-CM | POA: Diagnosis not present

## 2012-08-02 DIAGNOSIS — M545 Low back pain, unspecified: Secondary | ICD-10-CM

## 2012-08-02 MED ORDER — METFORMIN HCL 500 MG PO TABS: 500.0000 mg | ORAL_TABLET | Freq: Two times a day (BID) | ORAL | Status: DC

## 2012-08-02 MED ORDER — ATORVASTATIN CALCIUM 20 MG PO TABS: 20.0000 mg | ORAL_TABLET | Freq: Every evening | ORAL | Status: DC

## 2012-08-02 MED ORDER — AMLODIPINE BESYLATE 5 MG PO TABS: 5.0000 mg | ORAL_TABLET | Freq: Every day | ORAL | Status: DC

## 2012-08-02 MED ORDER — TRAMADOL HCL 50 MG PO TABS: 50.0000 mg | ORAL_TABLET | Freq: Four times a day (QID) | ORAL | Status: DC | PRN

## 2012-08-02 NOTE — ED Provider Notes (Signed)
Medical screening examination/treatment/procedure(s) were performed by non-physician practitioner and as supervising physician I was immediately available for consultation/collaboration.   Dione Booze, MD 08/02/12 [phone]

## 2012-08-02 NOTE — Progress Notes (Signed)
  Subjective:    Patient ID: Bobby Lopez, male    DOB: 03-25-1941, 71 y.o.   MRN: [id number]  HPI 71 yr old male for a cpx. He is also to follow up a visit to the ER yesterday. He has been having low back pain for several years but this usually responds to Motrin. Yesterday he had the sudden onset of severe pains in the left lower back and the left groin. No recent trauma. No abdominal pains. Some nausea but no vomiting. No change in urinations or BMs. No fever. At the ER he had a normal scrotal US and a normal CT of the abdomen and pelvis, thus ruling out kidney stones. He was given Tramadol, and this has really helped the pain. Today he feels much better with only a slight left lower back pain and no groin pain. He also asks that we clean out his ears, since he will be getting an audiologic evaluation in a few days.    Review of Systems  Constitutional: Negative.   HENT: Positive for hearing loss. Negative for ear pain, nosebleeds, congestion, facial swelling, rhinorrhea, sneezing, neck pain, neck stiffness, postnasal drip, sinus pressure, tinnitus and ear discharge.   Eyes: Negative.   Respiratory: Negative.   Cardiovascular: Negative.   Gastrointestinal: Negative.   Genitourinary: Negative.   Musculoskeletal: Positive for back pain. Negative for myalgias, joint swelling, arthralgias and gait problem.  Skin: Negative.   Neurological: Negative.   Hematological: Negative.   Psychiatric/Behavioral: Negative.        Objective:   Physical Exam  Constitutional: He is oriented to person, place, and time. He appears well-developed and well-nourished. No distress.  HENT:  Head: Normocephalic and atraumatic.  Nose: Nose normal.  Mouth/Throat: Oropharynx is clear and moist. No oropharyngeal exudate.       Ears are full of cerumen   Eyes: Conjunctivae normal and EOM are normal. Pupils are equal, round, and reactive to light. Right eye exhibits no discharge. Left eye exhibits no discharge. No  scleral icterus.  Neck: Neck supple. No JVD present. No tracheal deviation present. No thyromegaly present.  Cardiovascular: Normal rate, regular rhythm, normal heart sounds and intact distal pulses.  Exam reveals no gallop and no friction rub.   No murmur heard.      EKG normal   Pulmonary/Chest: Effort normal and breath sounds normal. No respiratory distress. He has no wheezes. He has no rales. He exhibits no tenderness.  Abdominal: Soft. Bowel sounds are normal. He exhibits no distension and no mass. There is no tenderness. There is no rebound and no guarding.  Genitourinary: Rectum normal, prostate normal and penis normal. Guaiac negative stool. No penile tenderness.  Musculoskeletal: Normal range of motion. He exhibits no edema and no tenderness.  Lymphadenopathy:    He has no cervical adenopathy.  Neurological: He is alert and oriented to person, place, and time. He has normal reflexes. No cranial nerve deficit. He exhibits normal muscle tone. Coordination normal.  Skin: Skin is warm and dry. No rash noted. He is not diaphoretic. No erythema. No pallor.  Psychiatric: He has a normal mood and affect. His behavior is normal. Judgment and thought content normal.          Assessment & Plan:  Well exam. His back pain is stable now, and we will refill the Tramadol for him to use prn. His ears were irrigated clear with water.

## 2012-08-12 ENCOUNTER — Telehealth: Payer: Self-pay | Admitting: Family Medicine

## 2012-08-12 NOTE — Telephone Encounter (Signed)
Pt wife is requesting MRI of back due to back pain. Pt was seen on 08-02-2012

## 2012-08-15 NOTE — Telephone Encounter (Signed)
This referral was sent in

## 2012-08-15 NOTE — Addendum Note (Signed)
Addended by: Gershon Crane A on: 08/15/2012 11:23 AM   Modules accepted: Orders

## 2012-08-15 NOTE — Telephone Encounter (Signed)
I spoke with pt  

## 2012-08-18 NOTE — Addendum Note (Signed)
Addended by: Gershon Crane A on: 08/18/2012 01:30 PM   Modules accepted: Orders

## 2012-08-19 ENCOUNTER — Other Ambulatory Visit: Payer: Medicare Other

## 2012-08-23 ENCOUNTER — Ambulatory Visit: Payer: BC Managed Care – PPO | Attending: Family Medicine

## 2012-08-23 DIAGNOSIS — M545 Low back pain, unspecified: Secondary | ICD-10-CM | POA: Diagnosis not present

## 2012-08-23 DIAGNOSIS — IMO0001 Reserved for inherently not codable concepts without codable children: Secondary | ICD-10-CM | POA: Insufficient documentation

## 2012-08-23 DIAGNOSIS — R5381 Other malaise: Secondary | ICD-10-CM | POA: Insufficient documentation

## 2012-08-23 DIAGNOSIS — M25569 Pain in unspecified knee: Secondary | ICD-10-CM | POA: Diagnosis not present

## 2012-08-25 ENCOUNTER — Ambulatory Visit: Payer: BC Managed Care – PPO

## 2012-08-25 DIAGNOSIS — R5381 Other malaise: Secondary | ICD-10-CM | POA: Diagnosis not present

## 2012-08-25 DIAGNOSIS — IMO0001 Reserved for inherently not codable concepts without codable children: Secondary | ICD-10-CM | POA: Diagnosis not present

## 2012-08-25 DIAGNOSIS — M25569 Pain in unspecified knee: Secondary | ICD-10-CM | POA: Diagnosis not present

## 2012-08-25 DIAGNOSIS — M545 Low back pain, unspecified: Secondary | ICD-10-CM | POA: Diagnosis not present

## 2012-08-30 ENCOUNTER — Encounter: Payer: Self-pay | Admitting: Family Medicine

## 2012-08-30 ENCOUNTER — Ambulatory Visit (INDEPENDENT_AMBULATORY_CARE_PROVIDER_SITE_OTHER): Payer: Medicare Other | Admitting: Family Medicine

## 2012-08-30 ENCOUNTER — Ambulatory Visit: Payer: BC Managed Care – PPO

## 2012-08-30 VITALS — BP 112/70 | HR 88 | Temp 98.3°F | Wt 210.0 lb

## 2012-08-30 DIAGNOSIS — M545 Low back pain, unspecified: Secondary | ICD-10-CM

## 2012-08-30 DIAGNOSIS — R5381 Other malaise: Secondary | ICD-10-CM | POA: Diagnosis not present

## 2012-08-30 DIAGNOSIS — M25569 Pain in unspecified knee: Secondary | ICD-10-CM | POA: Diagnosis not present

## 2012-08-30 DIAGNOSIS — IMO0001 Reserved for inherently not codable concepts without codable children: Secondary | ICD-10-CM | POA: Diagnosis not present

## 2012-08-30 NOTE — Progress Notes (Signed)
  Subjective:    Patient ID: Bobby Lopez, male    DOB: Nov 21, 1940, 71 y.o.   MRN: [id number]  HPI Here to follow up on low back pain. He was seen on 08-02-12 and he described pains in the left lower back that run down into the left groin and the left leg. He also is having some weakness in the leg, and the leg feels like it may buckle under him at times. He has had 3 sessions of PT, and this has helped the pain a little bit but not the weakness. Using Tramadol.   Review of Systems  Constitutional: Negative.   Musculoskeletal: Positive for back pain.       Objective:   Physical Exam  Constitutional: He appears well-developed and well-nourished.  Musculoskeletal:       Tender in the lower back with full ROM           Assessment & Plan:  Continue PT. He really needs an MRI scan but his insurance company had a rule that he cannot get one until he has been under our care for this for at least 4 weeks. This time frame will have passed later this week, so we will resubmit an order.

## 2012-08-31 ENCOUNTER — Telehealth: Payer: Self-pay | Admitting: Family Medicine

## 2012-08-31 NOTE — Telephone Encounter (Signed)
Pt.s therapy is on  Dec 24 and 31 are both at 8 am. Therefore, MRI can be scheduled. 336. [phone] cell  Work: [phone] ask for Dennie Bible. (Wife)

## 2012-08-31 NOTE — Telephone Encounter (Signed)
We will take care of this 

## 2012-09-01 ENCOUNTER — Ambulatory Visit: Payer: BC Managed Care – PPO | Admitting: Physical Therapy

## 2012-09-01 DIAGNOSIS — M25569 Pain in unspecified knee: Secondary | ICD-10-CM | POA: Diagnosis not present

## 2012-09-01 DIAGNOSIS — M545 Low back pain, unspecified: Secondary | ICD-10-CM | POA: Diagnosis not present

## 2012-09-01 DIAGNOSIS — IMO0001 Reserved for inherently not codable concepts without codable children: Secondary | ICD-10-CM | POA: Diagnosis not present

## 2012-09-01 DIAGNOSIS — R5381 Other malaise: Secondary | ICD-10-CM | POA: Diagnosis not present

## 2012-09-01 NOTE — Addendum Note (Signed)
Addended by: Gershon Crane A on: 09/01/2012 08:38 AM   Modules accepted: Orders

## 2012-09-06 ENCOUNTER — Ambulatory Visit
Admission: RE | Admit: 2012-09-06 | Discharge: 2012-09-06 | Disposition: A | Discharge status: Home / Self Care | Payer: BC Managed Care – PPO | Source: Ambulatory Visit | Attending: Family Medicine | Admitting: Family Medicine

## 2012-09-06 ENCOUNTER — Other Ambulatory Visit: Payer: Self-pay | Admitting: Family Medicine

## 2012-09-06 ENCOUNTER — Ambulatory Visit: Payer: BC Managed Care – PPO | Admitting: Physical Therapy

## 2012-09-06 DIAGNOSIS — M48061 Spinal stenosis, lumbar region without neurogenic claudication: Secondary | ICD-10-CM | POA: Diagnosis not present

## 2012-09-06 DIAGNOSIS — M545 Low back pain, unspecified: Secondary | ICD-10-CM

## 2012-09-06 DIAGNOSIS — IMO0002 Reserved for concepts with insufficient information to code with codable children: Secondary | ICD-10-CM

## 2012-09-06 DIAGNOSIS — R5381 Other malaise: Secondary | ICD-10-CM | POA: Diagnosis not present

## 2012-09-06 DIAGNOSIS — IMO0001 Reserved for inherently not codable concepts without codable children: Secondary | ICD-10-CM | POA: Diagnosis not present

## 2012-09-06 DIAGNOSIS — M25569 Pain in unspecified knee: Secondary | ICD-10-CM | POA: Diagnosis not present

## 2012-09-06 DIAGNOSIS — M5126 Other intervertebral disc displacement, lumbar region: Secondary | ICD-10-CM | POA: Diagnosis not present

## 2012-09-08 ENCOUNTER — Ambulatory Visit: Payer: BC Managed Care – PPO | Admitting: Physical Therapy

## 2012-09-08 DIAGNOSIS — M25569 Pain in unspecified knee: Secondary | ICD-10-CM | POA: Diagnosis not present

## 2012-09-08 DIAGNOSIS — R5381 Other malaise: Secondary | ICD-10-CM | POA: Diagnosis not present

## 2012-09-08 DIAGNOSIS — M545 Low back pain, unspecified: Secondary | ICD-10-CM | POA: Diagnosis not present

## 2012-09-08 DIAGNOSIS — IMO0001 Reserved for inherently not codable concepts without codable children: Secondary | ICD-10-CM | POA: Diagnosis not present

## 2012-09-08 NOTE — Addendum Note (Signed)
Addended by: Gershon Crane A on: 09/08/2012 08:29 AM   Modules accepted: Orders

## 2012-09-09 NOTE — Progress Notes (Signed)
Quick Note:  I left voice message with results. ______ 

## 2012-09-12 ENCOUNTER — Telehealth: Payer: Self-pay | Admitting: Family Medicine

## 2012-09-12 NOTE — Telephone Encounter (Signed)
I left a voice message with MR results.

## 2012-09-13 ENCOUNTER — Ambulatory Visit: Payer: BC Managed Care – PPO

## 2012-09-13 DIAGNOSIS — M545 Low back pain, unspecified: Secondary | ICD-10-CM | POA: Diagnosis not present

## 2012-09-13 DIAGNOSIS — IMO0001 Reserved for inherently not codable concepts without codable children: Secondary | ICD-10-CM | POA: Diagnosis not present

## 2012-09-13 DIAGNOSIS — M25569 Pain in unspecified knee: Secondary | ICD-10-CM | POA: Diagnosis not present

## 2012-09-13 DIAGNOSIS — R5381 Other malaise: Secondary | ICD-10-CM | POA: Diagnosis not present

## 2012-09-15 ENCOUNTER — Ambulatory Visit: Payer: Medicare Other | Attending: Family Medicine | Admitting: Physical Therapy

## 2012-09-15 DIAGNOSIS — R5381 Other malaise: Secondary | ICD-10-CM | POA: Diagnosis not present

## 2012-09-15 DIAGNOSIS — M545 Low back pain, unspecified: Secondary | ICD-10-CM | POA: Insufficient documentation

## 2012-09-15 DIAGNOSIS — M25569 Pain in unspecified knee: Secondary | ICD-10-CM | POA: Diagnosis not present

## 2012-09-15 DIAGNOSIS — IMO0001 Reserved for inherently not codable concepts without codable children: Secondary | ICD-10-CM | POA: Insufficient documentation

## 2012-09-20 ENCOUNTER — Ambulatory Visit: Payer: Medicare Other

## 2012-09-22 ENCOUNTER — Other Ambulatory Visit: Payer: Self-pay | Admitting: Neurosurgery

## 2012-09-23 ENCOUNTER — Encounter (HOSPITAL_COMMUNITY): Payer: Self-pay | Admitting: Pharmacy Technician

## 2012-09-23 ENCOUNTER — Encounter: Payer: Medicare Other | Admitting: Physical Therapy

## 2012-09-23 ENCOUNTER — Encounter (HOSPITAL_COMMUNITY): Payer: Self-pay | Admitting: *Deleted

## 2012-09-26 ENCOUNTER — Ambulatory Visit (HOSPITAL_COMMUNITY): Payer: BC Managed Care – PPO

## 2012-09-26 ENCOUNTER — Ambulatory Visit (HOSPITAL_COMMUNITY)
Admission: RE | Admit: 2012-09-26 | Discharge: 2012-09-27 | Disposition: A | Discharge status: Home / Self Care | Payer: BC Managed Care – PPO | Source: Ambulatory Visit | Attending: Neurosurgery | Admitting: Neurosurgery

## 2012-09-26 ENCOUNTER — Ambulatory Visit (HOSPITAL_COMMUNITY): Payer: BC Managed Care – PPO | Admitting: Anesthesiology

## 2012-09-26 ENCOUNTER — Encounter (HOSPITAL_COMMUNITY): Payer: Self-pay | Admitting: *Deleted

## 2012-09-26 ENCOUNTER — Encounter (HOSPITAL_COMMUNITY): Payer: Self-pay | Admitting: Anesthesiology

## 2012-09-26 ENCOUNTER — Encounter (HOSPITAL_COMMUNITY)
Admission: RE | Disposition: A | Discharge status: Home / Self Care | Payer: Self-pay | Source: Ambulatory Visit | Attending: Neurosurgery

## 2012-09-26 DIAGNOSIS — E119 Type 2 diabetes mellitus without complications: Secondary | ICD-10-CM | POA: Insufficient documentation

## 2012-09-26 DIAGNOSIS — M549 Dorsalgia, unspecified: Secondary | ICD-10-CM | POA: Diagnosis not present

## 2012-09-26 DIAGNOSIS — M47817 Spondylosis without myelopathy or radiculopathy, lumbosacral region: Secondary | ICD-10-CM | POA: Diagnosis not present

## 2012-09-26 DIAGNOSIS — Z79899 Other long term (current) drug therapy: Secondary | ICD-10-CM | POA: Diagnosis not present

## 2012-09-26 DIAGNOSIS — Z01818 Encounter for other preprocedural examination: Secondary | ICD-10-CM | POA: Diagnosis not present

## 2012-09-26 DIAGNOSIS — Z0000 Encounter for general adult medical examination without abnormal findings: Secondary | ICD-10-CM

## 2012-09-26 DIAGNOSIS — M5126 Other intervertebral disc displacement, lumbar region: Secondary | ICD-10-CM | POA: Diagnosis not present

## 2012-09-26 DIAGNOSIS — I10 Essential (primary) hypertension: Secondary | ICD-10-CM | POA: Diagnosis not present

## 2012-09-26 HISTORY — DX: Unspecified osteoarthritis, unspecified site: M19.90

## 2012-09-26 HISTORY — DX: Acute embolism and thrombosis of unspecified deep veins of unspecified lower extremity: I82.409

## 2012-09-26 HISTORY — PX: LUMBAR LAMINECTOMY/DECOMPRESSION MICRODISCECTOMY: SHX5026

## 2012-09-26 HISTORY — DX: Dermatitis, unspecified: L30.9

## 2012-09-26 LAB — BASIC METABOLIC PANEL
BUN: 21 mg/dL (ref 6–23)
CO2: 28 mEq/L (ref 19–32)
Calcium: 9.1 mg/dL (ref 8.4–10.5)
Chloride: 101 mEq/L (ref 96–112)
Creatinine, Ser: 1.02 mg/dL (ref 0.50–1.35)
GFR calc Af Amer: 83 mL/min — ABNORMAL LOW (ref >90)
GFR calc non Af Amer: 72 mL/min — ABNORMAL LOW (ref >90)
Glucose, Bld: 159 mg/dL — ABNORMAL HIGH (ref 70–99)
Potassium: 3.9 mEq/L (ref 3.5–5.1)
Sodium: 139 mEq/L (ref 135–145)

## 2012-09-26 LAB — GLUCOSE, CAPILLARY
Glucose-Capillary: 154 mg/dL — ABNORMAL HIGH (ref 70–99)
Glucose-Capillary: 161 mg/dL — ABNORMAL HIGH (ref 70–99)
Glucose-Capillary: 144 mg/dL — ABNORMAL HIGH (ref 70–99)
Glucose-Capillary: 158 mg/dL — ABNORMAL HIGH (ref 70–99)
Glucose-Capillary: 158 mg/dL — ABNORMAL HIGH (ref 70–99)

## 2012-09-26 LAB — CBC
HCT: 41.1 % (ref 39.0–52.0)
Hemoglobin: 14.3 g/dL (ref 13.0–17.0)
MCH: 30.4 pg (ref 26.0–34.0)
MCHC: 34.8 g/dL (ref 30.0–36.0)
MCV: 87.4 fL (ref 78.0–100.0)
Platelets: 166 10*3/uL (ref 150–400)
RBC: 4.7 MIL/uL (ref 4.22–5.81)
RDW: 12.6 % (ref 11.5–15.5)
WBC: 7.3 10*3/uL (ref 4.0–10.5)

## 2012-09-26 LAB — SURGICAL PCR SCREEN
MRSA, PCR: NEGATIVE
Staphylococcus aureus: POSITIVE — AB

## 2012-09-26 SURGERY — LUMBAR LAMINECTOMY/DECOMPRESSION MICRODISCECTOMY 1 LEVEL
Anesthesia: General | Site: Back | Laterality: Left | Wound class: Clean

## 2012-09-26 MED ORDER — AMLODIPINE BESYLATE 5 MG PO TABS
5.0000 mg | ORAL_TABLET | Freq: Every day | ORAL | Status: DC
  Administered 2012-09-26: 5 mg via ORAL
  Filled 2012-09-26 (×2): qty 1

## 2012-09-26 MED ORDER — CYCLOBENZAPRINE HCL 10 MG PO TABS: 10.0000 mg | ORAL_TABLET | Freq: Three times a day (TID) | ORAL | Status: DC | PRN

## 2012-09-26 MED ORDER — ONDANSETRON HCL 4 MG/2ML IJ SOLN: 4.0000 mg | INTRAMUSCULAR | Status: DC | PRN

## 2012-09-26 MED ORDER — PHENOL 1.4 % MT LIQD: 1.0000 | OROMUCOSAL | Status: DC | PRN

## 2012-09-26 MED ORDER — LIDOCAINE HCL (CARDIAC) 20 MG/ML IV SOLN
INTRAVENOUS | Status: DC | PRN
  Administered 2012-09-26: 80 mg via INTRAVENOUS

## 2012-09-26 MED ORDER — SODIUM CHLORIDE 0.9 % IV SOLN
INTRAVENOUS | Status: AC
  Filled 2012-09-26: qty 500

## 2012-09-26 MED ORDER — ACETAMINOPHEN 650 MG RE SUPP: 650.0000 mg | RECTAL | Status: DC | PRN

## 2012-09-26 MED ORDER — MIDAZOLAM HCL 5 MG/5ML IJ SOLN
INTRAMUSCULAR | Status: DC | PRN
  Administered 2012-09-26 (×2): 1 mg via INTRAVENOUS

## 2012-09-26 MED ORDER — TRAMADOL HCL 50 MG PO TABS
50.0000 mg | ORAL_TABLET | Freq: Four times a day (QID) | ORAL | Status: DC | PRN
  Filled 2012-09-26: qty 1

## 2012-09-26 MED ORDER — ATORVASTATIN CALCIUM 20 MG PO TABS
20.0000 mg | ORAL_TABLET | Freq: Every day | ORAL | Status: DC
  Administered 2012-09-26: 20 mg via ORAL
  Filled 2012-09-26 (×2): qty 1

## 2012-09-26 MED ORDER — CEFAZOLIN SODIUM 1-5 GM-% IV SOLN
1.0000 g | Freq: Three times a day (TID) | INTRAVENOUS | Status: AC
  Administered 2012-09-26 (×2): 1 g via INTRAVENOUS
  Filled 2012-09-26 (×2): qty 50

## 2012-09-26 MED ORDER — OXYCODONE HCL 5 MG/5ML PO SOLN: 5.0000 mg | Freq: Once | ORAL | Status: DC | PRN

## 2012-09-26 MED ORDER — LOSARTAN POTASSIUM 50 MG PO TABS
50.0000 mg | ORAL_TABLET | Freq: Every day | ORAL | Status: DC
  Administered 2012-09-26: 50 mg via ORAL
  Filled 2012-09-26 (×2): qty 1

## 2012-09-26 MED ORDER — PNEUMOCOCCAL VAC POLYVALENT 25 MCG/0.5ML IJ INJ: 0.5000 mL | INJECTION | Freq: Once | INTRAMUSCULAR | Status: DC

## 2012-09-26 MED ORDER — ONDANSETRON HCL 4 MG/2ML IJ SOLN: 4.0000 mg | Freq: Four times a day (QID) | INTRAMUSCULAR | Status: DC | PRN

## 2012-09-26 MED ORDER — HYDROCHLOROTHIAZIDE 12.5 MG PO CAPS
12.5000 mg | ORAL_CAPSULE | Freq: Every day | ORAL | Status: DC
  Administered 2012-09-26: 12.5 mg via ORAL
  Filled 2012-09-26 (×2): qty 1

## 2012-09-26 MED ORDER — ROCURONIUM BROMIDE 100 MG/10ML IV SOLN
INTRAVENOUS | Status: DC | PRN
  Administered 2012-09-26: 25 mg via INTRAVENOUS

## 2012-09-26 MED ORDER — LACTATED RINGERS IV SOLN
INTRAVENOUS | Status: DC | PRN
  Administered 2012-09-26 (×2): via INTRAVENOUS

## 2012-09-26 MED ORDER — OXYCODONE HCL 5 MG PO TABS: 5.0000 mg | ORAL_TABLET | Freq: Once | ORAL | Status: DC | PRN

## 2012-09-26 MED ORDER — OXYCODONE-ACETAMINOPHEN 5-325 MG PO TABS: 1.0000 | ORAL_TABLET | ORAL | Status: DC | PRN

## 2012-09-26 MED ORDER — THROMBIN 5000 UNITS EX KIT
PACK | CUTANEOUS | Status: DC | PRN
  Administered 2012-09-26 (×2): 5000 [IU] via TOPICAL

## 2012-09-26 MED ORDER — HYDROMORPHONE HCL PF 1 MG/ML IJ SOLN: 0.2500 mg | INTRAMUSCULAR | Status: DC | PRN

## 2012-09-26 MED ORDER — ACETAMINOPHEN 10 MG/ML IV SOLN
1000.0000 mg | Freq: Four times a day (QID) | INTRAVENOUS | Status: AC
  Administered 2012-09-26 (×3): 1000 mg via INTRAVENOUS
  Filled 2012-09-26 (×2): qty 100

## 2012-09-26 MED ORDER — METFORMIN HCL 500 MG PO TABS
500.0000 mg | ORAL_TABLET | Freq: Two times a day (BID) | ORAL | Status: DC
  Administered 2012-09-26 – 2012-09-27 (×2): 500 mg via ORAL
  Filled 2012-09-26 (×4): qty 1

## 2012-09-26 MED ORDER — SODIUM CHLORIDE 0.9 % IJ SOLN
3.0000 mL | Freq: Two times a day (BID) | INTRAMUSCULAR | Status: DC
  Administered 2012-09-26: 3 mL via INTRAVENOUS

## 2012-09-26 MED ORDER — 0.9 % SODIUM CHLORIDE (POUR BTL) OPTIME
TOPICAL | Status: DC | PRN
  Administered 2012-09-26: 1000 mL

## 2012-09-26 MED ORDER — SODIUM CHLORIDE 0.9 % IV SOLN: 250.0000 mL | INTRAVENOUS | Status: DC

## 2012-09-26 MED ORDER — NEOSTIGMINE METHYLSULFATE 1 MG/ML IJ SOLN
INTRAMUSCULAR | Status: DC | PRN
  Administered 2012-09-26: 4 mg via INTRAVENOUS

## 2012-09-26 MED ORDER — HEMOSTATIC AGENTS (NO CHARGE) OPTIME
TOPICAL | Status: DC | PRN
  Administered 2012-09-26: 1 via TOPICAL

## 2012-09-26 MED ORDER — FENTANYL CITRATE 0.05 MG/ML IJ SOLN
INTRAMUSCULAR | Status: DC | PRN
  Administered 2012-09-26: 100 ug via INTRAVENOUS
  Administered 2012-09-26: 50 ug via INTRAVENOUS
  Administered 2012-09-26: 100 ug via INTRAVENOUS

## 2012-09-26 MED ORDER — CEFAZOLIN SODIUM-DEXTROSE 2-3 GM-% IV SOLR
2.0000 g | Freq: Once | INTRAVENOUS | Status: AC
  Administered 2012-09-26: 2 g via INTRAVENOUS
  Filled 2012-09-26: qty 50

## 2012-09-26 MED ORDER — HYDROMORPHONE HCL PF 1 MG/ML IJ SOLN: 0.5000 mg | INTRAMUSCULAR | Status: DC | PRN

## 2012-09-26 MED ORDER — ADULT MULTIVITAMIN W/MINERALS CH
1.0000 | ORAL_TABLET | Freq: Every day | ORAL | Status: DC
  Administered 2012-09-26: 1 via ORAL
  Filled 2012-09-26 (×2): qty 1

## 2012-09-26 MED ORDER — GLYCOPYRROLATE 0.2 MG/ML IJ SOLN
INTRAMUSCULAR | Status: DC | PRN
  Administered 2012-09-26: .6 mg via INTRAVENOUS

## 2012-09-26 MED ORDER — ONDANSETRON HCL 4 MG/2ML IJ SOLN
INTRAMUSCULAR | Status: DC | PRN
  Administered 2012-09-26: 4 mg via INTRAVENOUS

## 2012-09-26 MED ORDER — MENTHOL 3 MG MT LOZG: 1.0000 | LOZENGE | OROMUCOSAL | Status: DC | PRN

## 2012-09-26 MED ORDER — SODIUM CHLORIDE 0.9 % IJ SOLN: 3.0000 mL | INTRAMUSCULAR | Status: DC | PRN

## 2012-09-26 MED ORDER — LIDOCAINE-EPINEPHRINE 1 %-1:100000 IJ SOLN
INTRAMUSCULAR | Status: DC | PRN
  Administered 2012-09-26: 10 mL via INTRADERMAL

## 2012-09-26 MED ORDER — SUCCINYLCHOLINE CHLORIDE 20 MG/ML IJ SOLN
INTRAMUSCULAR | Status: DC | PRN
  Administered 2012-09-26: 140 mg via INTRAVENOUS

## 2012-09-26 MED ORDER — BACITRACIN 50000 UNITS IM SOLR
INTRAMUSCULAR | Status: AC
  Filled 2012-09-26: qty 1

## 2012-09-26 MED ORDER — MUPIROCIN 2 % EX OINT
TOPICAL_OINTMENT | Freq: Once | CUTANEOUS | Status: AC
  Administered 2012-09-26: 1 via NASAL
  Filled 2012-09-26: qty 22

## 2012-09-26 MED ORDER — ACETAMINOPHEN 325 MG PO TABS: 650.0000 mg | ORAL_TABLET | ORAL | Status: DC | PRN

## 2012-09-26 MED ORDER — PROPOFOL 10 MG/ML IV BOLUS
INTRAVENOUS | Status: DC | PRN
  Administered 2012-09-26: 100 mg via INTRAVENOUS
  Administered 2012-09-26: 200 mg via INTRAVENOUS

## 2012-09-26 MED ORDER — ACETAMINOPHEN 10 MG/ML IV SOLN
INTRAVENOUS | Status: AC
  Filled 2012-09-26: qty 100

## 2012-09-26 MED ORDER — LOSARTAN POTASSIUM-HCTZ 50-12.5 MG PO TABS: 1.0000 | ORAL_TABLET | Freq: Every day | ORAL | Status: DC

## 2012-09-26 MED ORDER — BUPIVACAINE HCL (PF) 0.25 % IJ SOLN
INTRAMUSCULAR | Status: DC | PRN
  Administered 2012-09-26: 10 mL

## 2012-09-26 MED ORDER — CEFAZOLIN SODIUM-DEXTROSE 2-3 GM-% IV SOLR
INTRAVENOUS | Status: AC
  Filled 2012-09-26: qty 50

## 2012-09-26 MED ORDER — SODIUM CHLORIDE 0.9 % IR SOLN
Status: DC | PRN
  Administered 2012-09-26: 08:00:00

## 2012-09-26 SURGICAL SUPPLY — 55 items
ADH SKN CLS APL DERMABOND .7 (GAUZE/BANDAGES/DRESSINGS) ×1
APL SKNCLS STERI-STRIP NONHPOA (GAUZE/BANDAGES/DRESSINGS) ×1
BAG DECANTER FOR FLEXI CONT (MISCELLANEOUS) ×2 IMPLANT
BENZOIN TINCTURE PRP APPL 2/3 (GAUZE/BANDAGES/DRESSINGS) ×2 IMPLANT
BLADE SURG 11 STRL SS (BLADE) ×2 IMPLANT
BLADE SURG ROTATE 9660 (MISCELLANEOUS) ×2 IMPLANT
BRUSH SCRUB EZ PLAIN DRY (MISCELLANEOUS) ×2 IMPLANT
BUR MATCHSTICK NEURO 3.0 LAGG (BURR) ×2 IMPLANT
BUR PRECISION FLUTE 6.0 (BURR) ×2 IMPLANT
CANISTER SUCTION 2500CC (MISCELLANEOUS) ×2 IMPLANT
CLOTH BEACON ORANGE TIMEOUT ST (SAFETY) ×2 IMPLANT
CONT SPEC 4OZ CLIKSEAL STRL BL (MISCELLANEOUS) ×2 IMPLANT
DECANTER SPIKE VIAL GLASS SM (MISCELLANEOUS) ×2 IMPLANT
DERMABOND ADVANCED (GAUZE/BANDAGES/DRESSINGS) ×1
DERMABOND ADVANCED .7 DNX12 (GAUZE/BANDAGES/DRESSINGS) ×1 IMPLANT
DRAPE LAPAROTOMY 100X72X124 (DRAPES) ×2 IMPLANT
DRAPE MICROSCOPE LEICA (MISCELLANEOUS) ×2 IMPLANT
DRAPE MICROSCOPE ZEISS OPMI (DRAPES) ×1 IMPLANT
DRAPE POUCH INSTRU U-SHP 10X18 (DRAPES) ×2 IMPLANT
DRAPE PROXIMA HALF (DRAPES) IMPLANT
DRAPE SURG 17X23 STRL (DRAPES) ×2 IMPLANT
DRSG OPSITE 4X5.5 SM (GAUZE/BANDAGES/DRESSINGS) ×2 IMPLANT
ELECT REM PT RETURN 9FT ADLT (ELECTROSURGICAL) ×2
ELECTRODE REM PT RTRN 9FT ADLT (ELECTROSURGICAL) ×1 IMPLANT
GAUZE SPONGE 4X4 16PLY XRAY LF (GAUZE/BANDAGES/DRESSINGS) IMPLANT
GLOVE BIO SURGEON STRL SZ8 (GLOVE) ×2 IMPLANT
GLOVE BIOGEL PI IND STRL 7.5 (GLOVE) IMPLANT
GLOVE BIOGEL PI INDICATOR 7.5 (GLOVE) ×1
GLOVE ECLIPSE 7.5 STRL STRAW (GLOVE) ×4 IMPLANT
GLOVE EXAM NITRILE LRG STRL (GLOVE) IMPLANT
GLOVE EXAM NITRILE MD LF STRL (GLOVE) IMPLANT
GLOVE EXAM NITRILE XL STR (GLOVE) IMPLANT
GLOVE EXAM NITRILE XS STR PU (GLOVE) IMPLANT
GLOVE INDICATOR 8.5 STRL (GLOVE) ×2 IMPLANT
GOWN BRE IMP SLV AUR LG STRL (GOWN DISPOSABLE) ×2 IMPLANT
GOWN BRE IMP SLV AUR XL STRL (GOWN DISPOSABLE) ×2 IMPLANT
GOWN STRL REIN 2XL LVL4 (GOWN DISPOSABLE) IMPLANT
KIT BASIN OR (CUSTOM PROCEDURE TRAY) ×2 IMPLANT
KIT ROOM TURNOVER OR (KITS) ×2 IMPLANT
NEEDLE HYPO 22GX1.5 SAFETY (NEEDLE) ×2 IMPLANT
NEEDLE SPNL 22GX3.5 QUINCKE BK (NEEDLE) ×2 IMPLANT
NS IRRIG 1000ML POUR BTL (IV SOLUTION) ×2 IMPLANT
PACK LAMINECTOMY NEURO (CUSTOM PROCEDURE TRAY) ×2 IMPLANT
RUBBERBAND STERILE (MISCELLANEOUS) ×4 IMPLANT
SPONGE GAUZE 4X4 12PLY (GAUZE/BANDAGES/DRESSINGS) ×2 IMPLANT
SPONGE SURGIFOAM ABS GEL SZ50 (HEMOSTASIS) ×2 IMPLANT
STRIP CLOSURE SKIN 1/2X4 (GAUZE/BANDAGES/DRESSINGS) ×2 IMPLANT
SUT VIC AB 0 CT1 18XCR BRD8 (SUTURE) ×1 IMPLANT
SUT VIC AB 0 CT1 8-18 (SUTURE) ×2
SUT VIC AB 2-0 CT1 18 (SUTURE) ×2 IMPLANT
SUT VICRYL 4-0 PS2 18IN ABS (SUTURE) ×2 IMPLANT
SYR 20ML ECCENTRIC (SYRINGE) ×2 IMPLANT
TOWEL OR 17X24 6PK STRL BLUE (TOWEL DISPOSABLE) ×2 IMPLANT
TOWEL OR 17X26 10 PK STRL BLUE (TOWEL DISPOSABLE) ×2 IMPLANT
WATER STERILE IRR 1000ML POUR (IV SOLUTION) ×2 IMPLANT

## 2012-09-26 NOTE — Transfer of Care (Signed)
Immediate Anesthesia Transfer of Care Note  Patient: Bobby Lopez [facility]  Procedure(s) Performed: Procedure(s) (LRB) with comments: LUMBAR LAMINECTOMY/DECOMPRESSION MICRODISCECTOMY 1 LEVEL (Left) - Lumbar Lamiectomy Extraforaminal Diskectomy Lumbar Three-Four Left  Patient Location: PACU  Anesthesia Type:General  Level of Consciousness: awake, alert  and oriented  Airway & Oxygen Therapy: Patient Spontanous Breathing and Patient connected to nasal cannula oxygen  Post-op Assessment: Report given to PACU RN and Post -op Vital signs reviewed and stable  Post vital signs: Reviewed and stable  Complications: No apparent anesthesia complications

## 2012-09-26 NOTE — Discharge Summary (Signed)
  Physician Discharge Summary  Patient ID: Bobby Lopez MRN: [id number] DOB/AGE: May 27, 1941 72 y.o.  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 *    Discharged Condition: good  Hospital Course: Patient is in the hospital underwent the aforementioned procedure postoperatively patient did very well recovered in the floor on the floor patient was convalescing well was angling and voiding spontaneously and tolerating a regular diet was stable and be discharged home scheduled followup in one to 2 weeks with oxycodone cyclobenzaprine for pain and muscle spasm  Consults: Significant Diagnostic Studies: Treatments: Extra foraminal discectomy L3-4 left Discharge Exam: Blood pressure 132/78, pulse 55, temperature 98.5 F (36.9 C), temperature source Oral, resp. rate 16, SpO2 98.00%. Strength 5 out of 5 wound clean and dry  Disposition: Home     Medication List     As of 09/26/2012  2:14 PM    TAKE these medications         ALIGN PO   Take by mouth daily.      amLODipine 5 MG tablet   Commonly known as: NORVASC   Take 5 mg by mouth daily.      atorvastatin 20 MG tablet   Commonly known as: LIPITOR   Take 20 mg by mouth daily.      cyclobenzaprine 10 MG tablet   Commonly known as: FLEXERIL   Take 1 tablet (10 mg total) by mouth 3 (three) times daily as needed for muscle spasms.      FISH OIL PO   Take 1 capsule by mouth daily.      Garlic Tabs   Take 1 each by mouth daily.      losartan-hydrochlorothiazide 50-12.5 MG per tablet   Commonly known as: HYZAAR   Take 1 tablet by mouth daily.      metFORMIN 500 MG tablet   Commonly known as: GLUCOPHAGE   Take 500 mg by mouth 2 (two) times daily with a meal.      multivitamin with minerals Tabs   Take 1 tablet by mouth daily.      OVER THE COUNTER MEDICATION   Take 1 tablet by mouth daily. Prostate  health vitamin      oxyCODONE-acetaminophen 5-325 MG per tablet   Commonly known as: PERCOCET/ROXICET   Take 1-2 tablets by mouth every 4 (four) hours as needed.      traMADol 50 MG tablet   Commonly known as: ULTRAM   Take 50 mg by mouth every 6 (six) hours as needed. For pain         Signed: [name] 09/26/2012, 2:14 PM

## 2012-09-26 NOTE — Anesthesia Postprocedure Evaluation (Signed)
Anesthesia Post Note  Patient: Bobby Lopez  Procedure(s) Performed: Procedure(s) (LRB): LUMBAR LAMINECTOMY/DECOMPRESSION MICRODISCECTOMY 1 LEVEL (Left)  Anesthesia type: General  Patient location: PACU  Post pain: Pain level controlled and Adequate analgesia  Post assessment: Post-op Vital signs reviewed, Patient's Cardiovascular Status Stable, Respiratory Function Stable, Patent Airway and Pain level controlled  Last Vitals:  Filed Vitals:   09/26/12 0939  BP: 128/68  Pulse: 77  Temp:   Resp: 18    Post vital signs: Reviewed and stable  Level of consciousness: awake, alert  and oriented  Complications: No apparent anesthesia complications

## 2012-09-26 NOTE — Op Note (Signed)
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  EBL: Minimal  History of present illness: Patient is a very pleasant 71 year GEN is a progress 4 back and left leg pain radiating in at L3 nerve root pattern patient been refractory to all forms of conservative treatment imaging findings revealed a large disc herniation in the extra foraminal space at L3-4 on the left displacement left L3 nerve root and due to patient's progression of clinical syndrome with weakness in his left lower extremity date conservative treatment imaging findings he was recommended extra foraminal discectomy at this level as well the risks and benefits of the operation with him as well as perioperative course and expectations of outcome and alternatives of surgery he understood and agreed to proceed forward.  Operative procedure: Patient brought into the or was induced under general anesthesia positioned prone the Wilson frame his back was prepped and draped in routine sterile fashion. The operative x-ray localize the appropriate levels after infiltration 10 cc lidocaine with epi a midline incision was made and Bovie car was used to take a soft tissue subperiosteal dissections care lamina of L L3 and L4 exposing the facet complexes at L2-3 and L3-4 exposing the TPS L3. Interoperative X. identify the appropriate level so the inferior aspect of the T3 facet lateral pars and suppressant before so was all drilled down then extensive dissection was carried out on the undersurface of the lateral pars was dissected off the inner transverse ligament this was bitten away with a 3 minute Kerrison punch the adjacent transverse ligament was then removed the L3 nerve root was immediately identified and dissected free the disc spaces  identified below the L3 nerve root and I did not see a free fragment there I annulotomy was made there and the space was entered and cleaned out then working superiorly the space superior and superior the nerve up to the inferior pedicle on several very large her sister medially identified dissected off the insertion of the nerve and removed in piecemeal fashion. After removal of these large free fragments the L3 nerve was completely decompressed the undersurface of the III nerve root was explored with I nerve hook and coronary dilator and noted no further stenosis meticulous in a stasis was maintained Gelfoam spaces reinspected to confirm no loose fragments. Was in to see her get meticulous hemostasis was maintained and the wounds closed in layers with after Vicryl the skin was closed running 4 septic or benzoin and Steri-Strips were applied patient recovered in stable condition. At the end of case all needle counts and sponge counts were correct.

## 2012-09-26 NOTE — Preoperative (Signed)
Beta Blockers   Reason not to administer Beta Blockers:Not Applicable 

## 2012-09-26 NOTE — Anesthesia Preprocedure Evaluation (Signed)
Anesthesia Evaluation  Patient identified by MRN, date of birth, ID band Patient awake    Reviewed: Allergy & Precautions, H&P , NPO status , Patient's Chart, lab work & pertinent test results  Airway Mallampati: II  Neck ROM: full    Dental   Pulmonary former smoker,          Cardiovascular hypertension,     Neuro/Psych    GI/Hepatic   Endo/Other  diabetes, Type 2  Renal/GU      Musculoskeletal  (+) Arthritis -,   Abdominal   Peds  Hematology   Anesthesia Other Findings   Reproductive/Obstetrics                           Anesthesia Physical Anesthesia Plan  ASA: III  Anesthesia Plan: General   Post-op Pain Management:    Induction: Intravenous  Airway Management Planned: Oral ETT  Additional Equipment:   Intra-op Plan:   Post-operative Plan: Extubation in OR  Informed Consent: I have reviewed the patients History and Physical, chart, labs and discussed the procedure including the risks, benefits and alternatives for the proposed anesthesia with the patient or authorized representative who has indicated his/her understanding and acceptance.     Plan Discussed with: CRNA and Surgeon  Anesthesia Plan Comments:         Anesthesia Quick Evaluation

## 2012-09-26 NOTE — H&P (Signed)
Bobby Lopez is an 72 y.o. male.   Chief Complaint: Back and left leg pain HPI: Patient is a 72 year old gentleman is a progress worsening back and left leg pain range his anterior quad lateral thigh and occasionally in both down just below his knee this initially came on very severely a lot of hip and groin pain however the pain is resolved he's been left with left looks to me weakness of difficulty squatting down his left leg and standing up. 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 And due to his progressive weakness in left lower extremities progression of clinical syndrome he stay conservative treatment I recommended extra foraminal discectomy on the left at L3-4 went over the risks and benefits of the operation with him as well as perioperative course expectations about alternatives surgery he understood and agreed to proceed forward  Past Medical History  Diagnosis Date  . Hypertension   . Hyperlipidemia   . Diabetes mellitus     type  II  . Ventral hernia     followed by Dr. Alyse Low  . Arthritis     Back  . DVT (deep venous thrombosis)   . Eczema     Past Surgical History  Procedure Date  . Cholecystectomy   . Colonoscopy 01-27-06    per Dr. Jarold Motto, repeat in 10 yrs    Family History  Problem Relation Age of Onset  . Diabetes Mother   . Hypertension Mother    Social History:  reports that he has quit smoking. He has never used smokeless tobacco. He reports that he does not drink alcohol or use illicit drugs.  Allergies:  Allergies  Allergen Reactions  . Ace Inhibitors     cough  . Hydrocodone     REACTION: nausea    Medications Prior to Admission  Medication Sig Dispense Refill  . amLODipine (NORVASC) 5 MG tablet Take 5 mg by mouth daily.      Marland Kitchen atorvastatin (LIPITOR) 20 MG tablet Take 20  mg by mouth daily.      . Garlic TABS Take 1 each by mouth daily.       Marland Kitchen losartan-hydrochlorothiazide (HYZAAR) 50-12.5 MG per tablet Take 1 tablet by mouth daily.      . metFORMIN (GLUCOPHAGE) 500 MG tablet Take 500 mg by mouth 2 (two) times daily with a meal.      . Multiple Vitamin (MULTIVITAMIN WITH MINERALS) TABS Take 1 tablet by mouth daily.      . Omega-3 Fatty Acids (FISH OIL PO) Take 1 capsule by mouth daily.      Marland Kitchen OVER THE COUNTER MEDICATION Take 1 tablet by mouth daily. Prostate health vitamin      . Probiotic Product (ALIGN PO) Take by mouth daily.      . traMADol (ULTRAM) 50 MG tablet Take 50 mg by mouth every 6 (six) hours as needed. For pain        Results for orders placed during the hospital encounter of 09/26/12 (from the past 48 hour(s))  GLUCOSE, CAPILLARY     Status: Abnormal   Collection Time   09/26/12  6:10 AM      Component Value Range Comment   Glucose-Capillary 154 (*) 70 - 99 mg/dL    Dg Chest 2 View  1/61/0960  *RADIOLOGY REPORT*  Clinical  Data: Preop for lumbar laminectomy.  Hypertension.  CHEST - 2 VIEW  Comparison: None.  Findings:  The heart size and pulmonary vascularity are normal. The lungs appear clear and expanded without focal air space disease or consolidation. No blunting of the costophrenic angles.  No pneumothorax.  Mediastinal contours appear intact.  Degenerative changes in the thoracic spine.  Surgical clips in the right upper quadrant.  IMPRESSION: No evidence of active pulmonary disease.   Original Report Authenticated By: Burman Nieves, M.D.     Review of Systems  Constitutional: Negative.   HENT: Negative.   Eyes: Negative.   Respiratory: Negative.   Cardiovascular: Negative.   Gastrointestinal: Negative.   Genitourinary: Negative.   Musculoskeletal: Positive for myalgias and back pain.  Skin: Negative.   Neurological: Positive for tingling and tremors.  Psychiatric/Behavioral: Negative.     Blood pressure 122/76, pulse 57,  temperature 98.5 F (36.9 C), temperature source Oral, resp. rate 20, SpO2 98.00%. Physical Exam  Constitutional: He is oriented to person, place, and time.  HENT:  Head: Normocephalic.  Eyes: Pupils are equal, round, and reactive to light.  Neck: Normal range of motion.  Respiratory: Effort normal.  GI: Soft.  Neurological: He is alert and oriented to person, place, and time. GCS eye subscore is 4. GCS verbal subscore is 5. GCS motor subscore is 6.  Reflex Scores:      Tricep reflexes are 1+ on the right side and 1+ on the left side.      Bicep reflexes are 1+ on the right side and 1+ on the left side.      Brachioradialis reflexes are 1+ on the right side and 1+ on the left side.      Patellar reflexes are 1+ on the right side and 1+ on the left side.      Achilles reflexes are 1+ on the right side and 1+ on the left side.      Strength is 5 out of 5 in his iliopsoas, quads, and she's, gastrocs, anterior tibialis, and EHL in the right leg. In the left lower extremity he has a weakness in his left quadricep at 4-4+ out of 5 positive for weakness of his left iliopsoas distally he is 5 of 5 in his gastrocs anterior tibialis and EHL.     Assessment/Plan 72 year old gentleman presents for a left L3-4 extraforaminal discectomy  Reeanna Acri P 09/26/2012, 7:20 AM

## 2012-09-26 NOTE — Progress Notes (Signed)
09/26/12 0650  OBSTRUCTIVE SLEEP APNEA  Have you ever been diagnosed with sleep apnea through a sleep study? No  Do you snore loudly (loud enough to be heard through closed doors)?  0  Do you often feel tired, fatigued, or sleepy during the daytime? 1  Has anyone observed you stop breathing during your sleep? 0  Do you have, or are you being treated for high blood pressure? 1  BMI more than 35 kg/m2? 0  Age over 72 years old? 1  Neck circumference greater than 40 cm/18 inches? 0  Gender: 1  Obstructive Sleep Apnea Score 4   Score 4 or greater  Results sent to PCP

## 2012-09-27 ENCOUNTER — Encounter (HOSPITAL_COMMUNITY): Payer: Self-pay | Admitting: Neurosurgery

## 2012-09-27 DIAGNOSIS — M5126 Other intervertebral disc displacement, lumbar region: Secondary | ICD-10-CM | POA: Diagnosis not present

## 2012-09-27 LAB — GLUCOSE, CAPILLARY: Glucose-Capillary: 168 mg/dL — ABNORMAL HIGH (ref 70–99)

## 2012-09-27 NOTE — Progress Notes (Signed)
Pt given D/C instructions with Rx's, verbal understanding given. Pt D/C'd home via wheelchair @ 1015 per MD order. Trevelle Mcgurn, RN 

## 2012-09-27 NOTE — Progress Notes (Signed)
Subjective: Patient reports Voiding better no leg pain  Objective: Vital signs in last 24 hours: Temp:  [97.4 F (36.3 C)-100.1 F (37.8 C)] 98.7 F (37.1 C) (01/14 0802) Pulse Rate:  [49-91] 90  (01/14 0802) Resp:  [11-29] 16  (01/14 0802) BP: (97-151)/(51-79) 107/67 mmHg (01/14 0802) SpO2:  [94 %-100 %] 97 % (01/14 0802)  Intake/Output from previous day: 01/13 0701 - 01/14 0700 In: 2460 [P.O.:960; I.V.:1500] Out: 2900 [Urine:2800; Blood:100] Intake/Output this shift:    Strength out of 5 wound dry  Lab Results:  [facility] 09/26/12 0658  WBC 7.3  HGB 14.3  HCT 41.1  PLT 166   BMET  Basename 09/26/12 0658  NA 139  K 3.9  CL 101  CO2 28  GLUCOSE 159*  BUN 21  CREATININE 1.02  CALCIUM 9.1    Studies/Results: Dg Chest 2 View  09/26/2012  *RADIOLOGY REPORT*  Clinical Data: Preop for lumbar laminectomy.  Hypertension.  CHEST - 2 VIEW  Comparison: None.  Findings:  The heart size and pulmonary vascularity are normal. The lungs appear clear and expanded without focal air space disease or consolidation. No blunting of the costophrenic angles.  No pneumothorax.  Mediastinal contours appear intact.  Degenerative changes in the thoracic spine.  Surgical clips in the right upper quadrant.  IMPRESSION: No evidence of active pulmonary disease.   Original Report Authenticated By: Burman Nieves, M.D.    Dg Lumbar Spine 2-3 Views  09/26/2012  *RADIOLOGY REPORT*  Clinical Data: Back pain  LUMBAR SPINE - 2-3 VIEW  Comparison: MRI 09/06/2012.  Findings: Film #1 demonstrates a needle at L4.  Film #2 demonstrates a probe directed most closely toward the pedicle of L3.  IMPRESSION: As above.   Original Report Authenticated By: Davonna Belling, M.D.     Assessment/Plan: Discharge him  LOS: 1 day     Therin Vetsch P 09/27/2012, 8:10 AM

## 2012-11-21 ENCOUNTER — Telehealth: Payer: Self-pay | Admitting: Family Medicine

## 2012-11-21 DIAGNOSIS — E119 Type 2 diabetes mellitus without complications: Secondary | ICD-10-CM

## 2012-11-21 DIAGNOSIS — E785 Hyperlipidemia, unspecified: Secondary | ICD-10-CM

## 2012-11-21 NOTE — Telephone Encounter (Signed)
I put future lab orders in computer, see email note from pt.

## 2013-01-06 ENCOUNTER — Encounter: Payer: Self-pay | Admitting: Family Medicine

## 2013-01-06 ENCOUNTER — Ambulatory Visit (INDEPENDENT_AMBULATORY_CARE_PROVIDER_SITE_OTHER): Payer: Medicare Other | Admitting: Family Medicine

## 2013-01-06 VITALS — BP 110/60 | HR 76 | Temp 97.6°F | Wt 211.0 lb

## 2013-01-06 DIAGNOSIS — H6123 Impacted cerumen, bilateral: Secondary | ICD-10-CM

## 2013-01-06 DIAGNOSIS — H6120 Impacted cerumen, unspecified ear: Secondary | ICD-10-CM

## 2013-01-06 NOTE — Progress Notes (Signed)
  Subjective:    Patient ID: Bobby Lopez, male    DOB: May 08, 1941, 72 y.o.   MRN: [id number]  HPI Here to have his ears cleaned. He has had pressure but no pain. His hearing aids give feed back all the time lately.    Review of Systems  Constitutional: Negative.   HENT: Positive for hearing loss. Negative for ear pain, congestion, sinus pressure and tinnitus.   Eyes: Negative.        Objective:   Physical Exam  Constitutional: He appears well-developed and well-nourished.  HENT:  Both ears are full of cerumen   Eyes: Conjunctivae are normal.  Lymphadenopathy:    He has no cervical adenopathy.          Assessment & Plan:  Both ears irrigated clear with water

## 2013-01-23 ENCOUNTER — Other Ambulatory Visit (INDEPENDENT_AMBULATORY_CARE_PROVIDER_SITE_OTHER): Payer: BC Managed Care – PPO

## 2013-01-23 DIAGNOSIS — E119 Type 2 diabetes mellitus without complications: Secondary | ICD-10-CM

## 2013-01-23 DIAGNOSIS — E785 Hyperlipidemia, unspecified: Secondary | ICD-10-CM | POA: Diagnosis not present

## 2013-01-23 LAB — LIPID PANEL
Cholesterol: 121 mg/dL (ref 0–200)
HDL: 33.9 mg/dL — ABNORMAL LOW (ref >39.00)
LDL Cholesterol: 75 mg/dL (ref 0–99)
Total CHOL/HDL Ratio: 4
Triglycerides: 62 mg/dL (ref 0.0–149.0)
VLDL: 12.4 mg/dL (ref 0.0–40.0)

## 2013-01-23 LAB — HEPATIC FUNCTION PANEL
ALT: 22 U/L (ref 0–53)
AST: 17 U/L (ref 0–37)
Albumin: 3.8 g/dL (ref 3.5–5.2)
Alkaline Phosphatase: 45 U/L (ref 39–117)
Bilirubin, Direct: 0.1 mg/dL (ref 0.0–0.3)
Total Bilirubin: 0.8 mg/dL (ref 0.3–1.2)
Total Protein: 7.1 g/dL (ref 6.0–8.3)

## 2013-01-23 LAB — HEMOGLOBIN A1C: Hgb A1c MFr Bld: 7.1 % — ABNORMAL HIGH (ref 4.6–6.5)

## 2013-01-23 NOTE — Progress Notes (Signed)
Quick Note:  I released results in my chart. ______ 

## 2013-01-30 ENCOUNTER — Ambulatory Visit (INDEPENDENT_AMBULATORY_CARE_PROVIDER_SITE_OTHER): Payer: Medicare Other | Admitting: Family Medicine

## 2013-01-30 ENCOUNTER — Encounter: Payer: Self-pay | Admitting: Family Medicine

## 2013-01-30 VITALS — BP 110/74 | HR 66 | Temp 98.2°F | Wt 212.0 lb

## 2013-01-30 DIAGNOSIS — E785 Hyperlipidemia, unspecified: Secondary | ICD-10-CM | POA: Diagnosis not present

## 2013-01-30 DIAGNOSIS — E119 Type 2 diabetes mellitus without complications: Secondary | ICD-10-CM

## 2013-01-30 DIAGNOSIS — I10 Essential (primary) hypertension: Secondary | ICD-10-CM

## 2013-01-30 NOTE — Progress Notes (Signed)
  Subjective:    Patient ID: KHAMBREL AMSDEN, male    DOB: [birth year], 72 y.o.   MRN: [id number]  HPI Here to follow up. He feels well except for some left knee pain. He needs a handicapped placard for his car. He does not feel this needs to see an orthopedist yet. His recent labs look great.    Review of Systems  Constitutional: Negative.   Respiratory: Negative.   Cardiovascular: Negative.        Objective:   Physical Exam  Constitutional: He appears well-developed and well-nourished.  Neck: No thyromegaly present.  Cardiovascular: Normal rate, regular rhythm, normal heart sounds and intact distal pulses.   Pulmonary/Chest: Effort normal and breath sounds normal.  Lymphadenopathy:    He has no cervical adenopathy.          Assessment & Plan:  Doing well. A DMV form was filled out.

## 2013-01-31 ENCOUNTER — Other Ambulatory Visit: Payer: Self-pay | Admitting: Family Medicine

## 2013-01-31 NOTE — Telephone Encounter (Signed)
Can we refill this? 

## 2013-02-04 ENCOUNTER — Other Ambulatory Visit: Payer: Self-pay | Admitting: Family Medicine

## 2013-07-29 IMAGING — CR DG LUMBAR SPINE 2-3V
1 series · 1 of 1 positions shown · non-contrast
Comparison: MRI 09/06/2012.

CLINICAL DATA: Back pain

LUMBAR SPINE - 2-3 VIEW

[view not recorded]
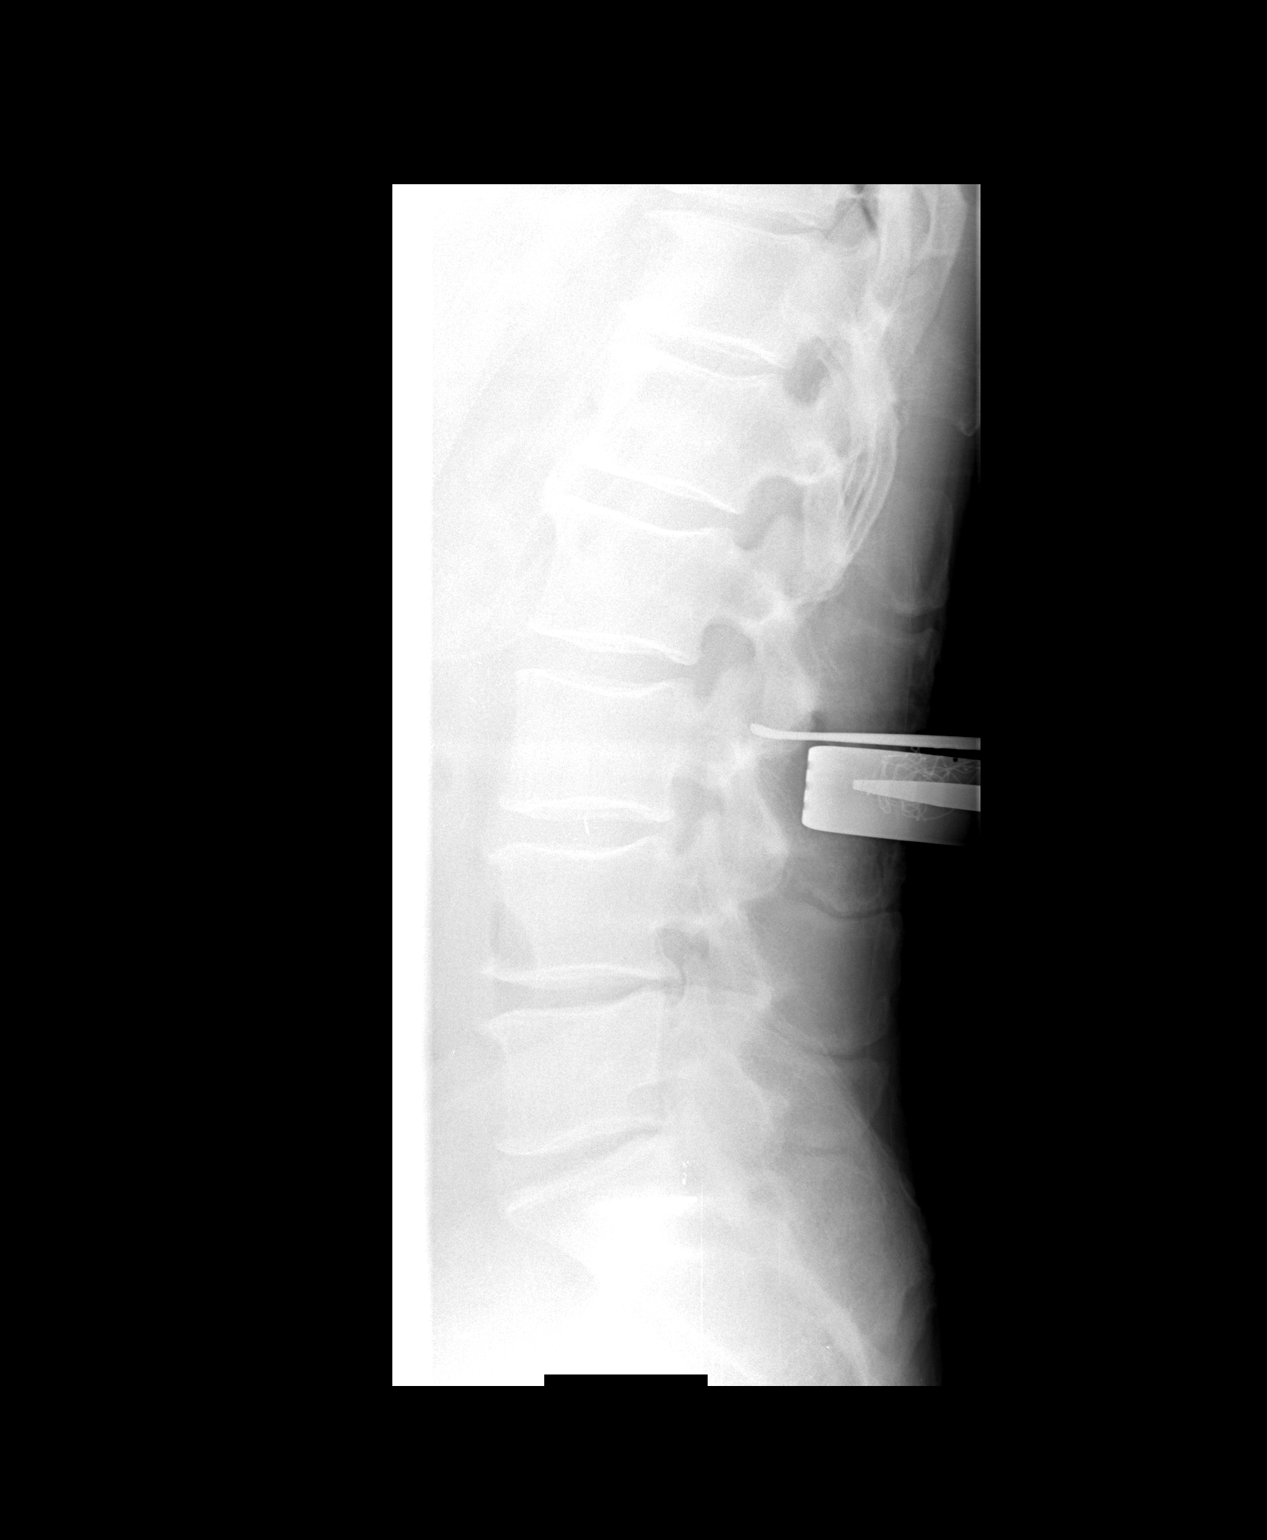

[1 of 1 positions shown; findings below may reference images not displayed]

FINDINGS: Film #1 demonstrates a needle at L4.  Film #2
demonstrates a probe directed most closely toward the pedicle of
L3.
IMPRESSION: As above.

## 2013-08-04 ENCOUNTER — Other Ambulatory Visit (INDEPENDENT_AMBULATORY_CARE_PROVIDER_SITE_OTHER): Payer: Medicare Other

## 2013-08-04 DIAGNOSIS — Z0000 Encounter for general adult medical examination without abnormal findings: Secondary | ICD-10-CM | POA: Diagnosis not present

## 2013-08-04 LAB — CBC WITH DIFFERENTIAL/PLATELET
Basophils Absolute: 0 10*3/uL (ref 0.0–0.1)
Basophils Relative: 0.2 % (ref 0.0–3.0)
Eosinophils Absolute: 0.2 10*3/uL (ref 0.0–0.7)
Eosinophils Relative: 1.6 % (ref 0.0–5.0)
HCT: 43 % (ref 39.0–52.0)
Hemoglobin: 14.7 g/dL (ref 13.0–17.0)
Lymphocytes Relative: 17.6 % (ref 12.0–46.0)
Lymphs Abs: 2.1 10*3/uL (ref 0.7–4.0)
MCHC: 34.2 g/dL (ref 30.0–36.0)
MCV: 90 fl (ref 78.0–100.0)
Monocytes Absolute: 1.2 10*3/uL — ABNORMAL HIGH (ref 0.1–1.0)
Monocytes Relative: 9.8 % (ref 3.0–12.0)
Neutro Abs: 8.5 10*3/uL — ABNORMAL HIGH (ref 1.4–7.7)
Neutrophils Relative %: 70.8 % (ref 43.0–77.0)
Platelets: 170 10*3/uL (ref 150.0–400.0)
RBC: 4.78 Mil/uL (ref 4.22–5.81)
RDW: 12.6 % (ref 11.5–14.6)
WBC: 12.1 10*3/uL — ABNORMAL HIGH (ref 4.5–10.5)

## 2013-08-04 LAB — POCT URINALYSIS DIPSTICK
Glucose, UA: NEGATIVE
Ketones, UA: NEGATIVE
Leukocytes, UA: NEGATIVE
Nitrite, UA: NEGATIVE
Spec Grav, UA: 1.025
Urobilinogen, UA: 0.2
pH, UA: 5.5

## 2013-08-04 LAB — LIPID PANEL
Cholesterol: 127 mg/dL (ref 0–200)
HDL: 36.7 mg/dL — ABNORMAL LOW (ref >39.00)
LDL Cholesterol: 74 mg/dL (ref 0–99)
Total CHOL/HDL Ratio: 3
Triglycerides: 84 mg/dL (ref 0.0–149.0)
VLDL: 16.8 mg/dL (ref 0.0–40.0)

## 2013-08-04 LAB — HEPATIC FUNCTION PANEL
ALT: 18 U/L (ref 0–53)
AST: 17 U/L (ref 0–37)
Albumin: 3.9 g/dL (ref 3.5–5.2)
Alkaline Phosphatase: 53 U/L (ref 39–117)
Bilirubin, Direct: 0.3 mg/dL (ref 0.0–0.3)
Total Bilirubin: 1.5 mg/dL — ABNORMAL HIGH (ref 0.3–1.2)
Total Protein: 7.1 g/dL (ref 6.0–8.3)

## 2013-08-04 LAB — MICROALBUMIN / CREATININE URINE RATIO
Creatinine,U: 275.3 mg/dL
Microalb Creat Ratio: 1.1 mg/g (ref 0.0–30.0)
Microalb, Ur: 2.9 mg/dL — ABNORMAL HIGH (ref 0.0–1.9)

## 2013-08-04 LAB — BASIC METABOLIC PANEL
BUN: 14 mg/dL (ref 6–23)
CO2: 27 mEq/L (ref 19–32)
Calcium: 9.5 mg/dL (ref 8.4–10.5)
Chloride: 102 mEq/L (ref 96–112)
Creatinine, Ser: 1 mg/dL (ref 0.4–1.5)
GFR: 75.3 mL/min (ref >60.00)
Glucose, Bld: 163 mg/dL — ABNORMAL HIGH (ref 70–99)
Potassium: 4.7 mEq/L (ref 3.5–5.1)
Sodium: 138 mEq/L (ref 135–145)

## 2013-08-04 LAB — PSA: PSA: 1.71 ng/mL (ref 0.10–4.00)

## 2013-08-04 LAB — TSH: TSH: 3.4 u[IU]/mL (ref 0.35–5.50)

## 2013-08-04 LAB — HEMOGLOBIN A1C: Hgb A1c MFr Bld: 6.9 % — ABNORMAL HIGH (ref 4.6–6.5)

## 2013-08-14 ENCOUNTER — Ambulatory Visit (INDEPENDENT_AMBULATORY_CARE_PROVIDER_SITE_OTHER): Payer: 59 | Admitting: Family Medicine

## 2013-08-14 ENCOUNTER — Encounter: Payer: Self-pay | Admitting: Family Medicine

## 2013-08-14 VITALS — BP 112/70 | HR 92 | Temp 98.3°F | Ht 70.75 in | Wt 204.0 lb

## 2013-08-14 DIAGNOSIS — I10 Essential (primary) hypertension: Secondary | ICD-10-CM

## 2013-08-14 DIAGNOSIS — R319 Hematuria, unspecified: Secondary | ICD-10-CM

## 2013-08-14 DIAGNOSIS — K439 Ventral hernia without obstruction or gangrene: Secondary | ICD-10-CM

## 2013-08-14 DIAGNOSIS — Z0000 Encounter for general adult medical examination without abnormal findings: Secondary | ICD-10-CM

## 2013-08-14 LAB — POCT URINALYSIS DIPSTICK
Bilirubin, UA: NEGATIVE
Glucose, UA: NEGATIVE
Ketones, UA: NEGATIVE
Nitrite, UA: NEGATIVE
Spec Grav, UA: >=1.03
Urobilinogen, UA: 0.2
pH, UA: 6

## 2013-08-14 MED ORDER — CIPROFLOXACIN HCL 500 MG PO TABS: 500.0000 mg | ORAL_TABLET | Freq: Two times a day (BID) | ORAL | Status: DC

## 2013-08-14 NOTE — Progress Notes (Signed)
   Subjective:    Patient ID: Bobby Lopez, male    DOB: [birth year], 72 y.o.   MRN: [id number]  HPI 71 yr old male for a cpx. He has had decreased hearing lately and feels he has a build up of ear cerumen again. Also his abdominal hernia is getting larger and is painful now for the first time. He had some hematuria on his labs but he denies any sx like burning or urgency.    Review of Systems  Constitutional: Negative.   HENT: Positive for hearing loss. Negative for congestion, ear discharge, ear pain, sinus pressure, sore throat and tinnitus.   Eyes: Negative.   Respiratory: Negative.   Cardiovascular: Negative.   Gastrointestinal: Negative.   Genitourinary: Negative.   Musculoskeletal: Negative.   Skin: Negative.   Neurological: Negative.   Psychiatric/Behavioral: Negative.        Objective:   Physical Exam  Constitutional: He is oriented to person, place, and time. He appears well-developed and well-nourished. No distress.  HENT:  Head: Normocephalic and atraumatic.  Nose: Nose normal.  Mouth/Throat: Oropharynx is clear and moist. No oropharyngeal exudate.  Both ear canals are full of cerumen  Eyes: Conjunctivae and EOM are normal. Pupils are equal, round, and reactive to light. Right eye exhibits no discharge. Left eye exhibits no discharge. No scleral icterus.  Neck: Neck supple. No JVD present. No tracheal deviation present. No thyromegaly present.  Cardiovascular: Normal rate, regular rhythm, normal heart sounds and intact distal pulses.  Exam reveals no gallop and no friction rub.   No murmur heard. EKG normal  Pulmonary/Chest: Effort normal and breath sounds normal. No respiratory distress. He has no wheezes. He has no rales. He exhibits no tenderness.  Abdominal: Soft. Bowel sounds are normal. He exhibits no distension. There is no tenderness. There is no rebound and no guarding.  Large ventral hernia that is not reducible and is mildly tender   Genitourinary: Rectum  normal, prostate normal and penis normal. Guaiac negative stool. No penile tenderness.  Musculoskeletal: Normal range of motion. He exhibits no edema and no tenderness.  Lymphadenopathy:    He has no cervical adenopathy.  Neurological: He is alert and oriented to person, place, and time. He has normal reflexes. No cranial nerve deficit. He exhibits normal muscle tone. Coordination normal.  Skin: Skin is warm and dry. No rash noted. He is not diaphoretic. No erythema. No pallor.  Psychiatric: He has a normal mood and affect. His behavior is normal. Judgment and thought content normal.          Assessment & Plan:  Well exam. We will refer to Surgery for the hernia. We will treat with 10 days of Cipro and then return to repeat a UA. At that time we will irrigate his ears.

## 2013-08-14 NOTE — Progress Notes (Signed)
Pre visit review using our clinic review tool, if applicable. No additional management support is needed unless otherwise documented below in the visit note. 

## 2013-08-16 LAB — URINE CULTURE: Colony Count: 9000

## 2013-08-23 ENCOUNTER — Ambulatory Visit (INDEPENDENT_AMBULATORY_CARE_PROVIDER_SITE_OTHER): Payer: Self-pay | Admitting: General Surgery

## 2013-08-24 ENCOUNTER — Ambulatory Visit (INDEPENDENT_AMBULATORY_CARE_PROVIDER_SITE_OTHER): Payer: 59 | Admitting: General Surgery

## 2013-08-24 ENCOUNTER — Encounter (INDEPENDENT_AMBULATORY_CARE_PROVIDER_SITE_OTHER): Payer: Self-pay | Admitting: General Surgery

## 2013-08-24 VITALS — BP 129/86 | HR 84 | Temp 97.7°F | Resp 18 | Ht 71.0 in | Wt 211.2 lb

## 2013-08-24 DIAGNOSIS — K432 Incisional hernia without obstruction or gangrene: Secondary | ICD-10-CM

## 2013-08-24 NOTE — Patient Instructions (Signed)
Call if you want to schedule ventral hernia repair with mesh

## 2013-08-24 NOTE — Progress Notes (Signed)
Patient ID: Bobby Lopez, male   DOB: April 08, 1941, 72 y.o.   MRN: [id number]  Chief Complaint  Patient presents with  . Hernia    HPI Bobby Lopez is a 72 y.o. male.  We are asked to see the patient in consultation by Bobby Lopez to evaluate him for a ventral hernia. The patient is a 72 year old white male who has a history of open cholecystectomy in 52 in [place]. Over the last few years he has developed a small bowel just along the upper portion of the incision. He denies any pain with this. He denies any nausea or vomiting. He does think it may have gotten a little bit larger since he saw Dr. Zachery Lopez for this about 2 years ago. His appetite is good his bowels are working normally. HPI  Past Medical History  Diagnosis Date  . Hypertension   . Hyperlipidemia   . Diabetes mellitus     type  II  . Ventral hernia     followed by Bobby Lopez  . Arthritis     Back  . DVT (deep venous thrombosis)   . Eczema   . Clotting disorder     Past Surgical History  Procedure Laterality Date  . Cholecystectomy    . Colonoscopy  01-27-06    per Bobby Lopez, repeat in 10 yrs  . Lumbar laminectomy/decompression microdiscectomy  09/26/2012    Procedure: LUMBAR LAMINECTOMY/DECOMPRESSION MICRODISCECTOMY 1 LEVEL;  Surgeon: Bobby Dollar, MD;  Location: [facility];  Service: Neurosurgery;  Laterality: Left;  Lumbar Lamiectomy Extraforaminal Diskectomy Lumbar Three-Four Left    Family History  Problem Relation Age of Onset  . Diabetes Mother   . Hypertension Mother     Social History History  Substance Use Topics  . Smoking status: Former Games developer  . Smokeless tobacco: Never Used     Comment: quit > 46 years ago  . Alcohol Use: No    Allergies  Allergen Reactions  . Ace Inhibitors     cough  . Hydrocodone     REACTION: nausea    Current Outpatient Prescriptions  Medication Sig Dispense Refill  . amLODipine (NORVASC) 5 MG tablet Take 5 mg by mouth daily.      Marland Kitchen atorvastatin  (LIPITOR) 20 MG tablet Take 20 mg by mouth daily.      . ciprofloxacin (CIPRO) 500 MG tablet Take 1 tablet (500 mg total) by mouth 2 (two) times daily.  20 tablet  0  . Garlic TABS Take 1 each by mouth daily.       Marland Kitchen losartan-hydrochlorothiazide (HYZAAR) 50-12.5 MG per tablet Take 1 tablet by mouth daily.      . metFORMIN (GLUCOPHAGE) 500 MG tablet Take 500 mg by mouth 2 (two) times daily with a meal.      . Multiple Vitamin (MULTIVITAMIN WITH MINERALS) TABS Take 1 tablet by mouth daily.      . Omega-3 Fatty Acids (FISH OIL PO) Take 1 capsule by mouth daily.      Marland Kitchen OVER THE COUNTER MEDICATION Take 1 tablet by mouth daily. Prostate health vitamin      . Probiotic Product (ALIGN PO) Take by mouth daily.       Current Facility-Administered Medications  Medication Dose Route Frequency Provider Last Rate Last Dose  . pneumococcal 23 valent vaccine (PNU-IMMUNE) injection 0.5 mL  0.5 mL Intramuscular Once Nelwyn Salisbury, MD        Review of Systems Review of Systems  Constitutional:  Negative.   HENT: Negative.   Eyes: Negative.   Respiratory: Negative.   Cardiovascular: Negative.   Gastrointestinal: Negative.   Endocrine: Negative.   Genitourinary: Negative.   Musculoskeletal: Negative.   Skin: Negative.   Allergic/Immunologic: Negative.   Neurological: Negative.   Hematological: Negative.   Psychiatric/Behavioral: Negative.     Blood pressure 129/86, pulse 84, temperature 97.7 F (36.5 C), temperature source Temporal, resp. rate 18, height 5\' 11"  (1.803 m), weight 211 lb 3.2 oz (95.8 kg).  Physical Exam Physical Exam  Constitutional: He is oriented to person, place, and time. He appears well-developed and well-nourished.  HENT:  Head: Normocephalic and atraumatic.  Eyes: Conjunctivae and EOM are normal. Pupils are equal, round, and reactive to light.  Neck: Normal range of motion. Neck supple.  Cardiovascular: Normal rate, regular rhythm and normal heart sounds.    Pulmonary/Chest: Effort normal and breath sounds normal.  Abdominal: Soft. Bowel sounds are normal.  There is a moderate-sized bulge along the upper portion of his old incision. The fascial defect is probably 4-5 cm across. The hernia seems to reduce fairly readily. There is no evidence of obstruction.  Musculoskeletal: Normal range of motion.  Neurological: He is alert and oriented to person, place, and time.  Skin: Skin is warm.  Psychiatric: He has a normal mood and affect. His behavior is normal.    Data Reviewed As above  Assessment    The patient has a small to moderate size ventral hernia from his previous operation. Because of the risk of incarceration strain patient I think he may benefit from having the hernia fixed. He is not sure he wants to undergo surgery at this point. I discussed with them in detail the risks and benefits of the operation a fixed hernia as well as some of the technical aspects including the risk of mesh infection and the risk of injury to intra-abdominal organs and he seems to understand. He would like to think about it and will let us know if he would like to schedule surgery.     Plan    Plan for laparoscopic assisted ventral hernia repair with mesh if the patient is agreeable.        TOTH III,Sri Clegg S 08/24/2013, 9:31 AM

## 2013-08-25 ENCOUNTER — Ambulatory Visit (INDEPENDENT_AMBULATORY_CARE_PROVIDER_SITE_OTHER): Payer: 59 | Admitting: Family Medicine

## 2013-08-25 ENCOUNTER — Encounter: Payer: Self-pay | Admitting: Family Medicine

## 2013-08-25 VITALS — BP 120/80 | HR 75 | Temp 98.7°F

## 2013-08-25 DIAGNOSIS — H6123 Impacted cerumen, bilateral: Secondary | ICD-10-CM

## 2013-08-25 DIAGNOSIS — H6120 Impacted cerumen, unspecified ear: Secondary | ICD-10-CM

## 2013-08-25 DIAGNOSIS — R319 Hematuria, unspecified: Secondary | ICD-10-CM

## 2013-08-25 LAB — POCT URINALYSIS DIPSTICK
Bilirubin, UA: NEGATIVE
Blood, UA: NEGATIVE
Glucose, UA: NEGATIVE
Ketones, UA: NEGATIVE
Leukocytes, UA: NEGATIVE
Nitrite, UA: NEGATIVE
Protein, UA: NEGATIVE
Spec Grav, UA: 1.025
Urobilinogen, UA: 0.2
pH, UA: 5.5

## 2013-08-25 NOTE — Progress Notes (Signed)
   Subjective:    Patient ID: Bobby Lopez, male    DOB: Aug 22, 1941, 72 y.o.   MRN: [id number]  HPI Here to follow up from his recent cpx. His UA had blood in it last time and he had some irritation. He has finished a course of Cipro and is here to recheck. Also he was noted to have cerumen in both ears.    Review of Systems  Constitutional: Negative.   HENT: Positive for hearing loss. Negative for ear pain.   Genitourinary: Negative.        Objective:   Physical Exam  Constitutional: He appears well-developed and well-nourished.  HENT:  Both ear canals were full of cerumen          Assessment & Plan:  His ears were cleared with a combination of irrigation and removal with a speculum. His urine is clear today

## 2013-08-25 NOTE — Progress Notes (Signed)
Pre visit review using our clinic review tool, if applicable. No additional management support is needed unless otherwise documented below in the visit note. 

## 2013-09-11 ENCOUNTER — Telehealth (INDEPENDENT_AMBULATORY_CARE_PROVIDER_SITE_OTHER): Payer: Self-pay

## 2013-09-11 NOTE — Telephone Encounter (Signed)
LMOM> no further testing needed before surgery. Just waiting on pt to decide if he wants to go through with surgery. Will do orders if he decides to proceed with surgery.

## 2013-09-11 NOTE — Telephone Encounter (Signed)
Message copied by Brennan Bailey on Mon Sep 11, 2013 10:45 AM ------      Message from: Mervin Kung      Created: Tue Sep 05, 2013 10:05 AM      Contact: [phone]       Marcelino Duster,      Pt wife (pat) called she wants to let Dr Carolynne Edouard know pt wants to have SX but feels there is some testing that still needs to be done. Please call her at listed number.       sonya ------

## 2013-09-11 NOTE — Telephone Encounter (Signed)
Wife returned call; message delivered.  States husband definitely wants to proceed with surgery.  Explained Dr. Carolynne Edouard will need to send orders to Surgery Schedulers, who will then call her with date, time and location.  This may take several days.  She understands.

## 2013-09-18 ENCOUNTER — Other Ambulatory Visit (INDEPENDENT_AMBULATORY_CARE_PROVIDER_SITE_OTHER): Payer: Self-pay | Admitting: General Surgery

## 2013-09-18 NOTE — Telephone Encounter (Signed)
Received orders from Dr Marlou Starks and sent them to surgery schedulers. Called pt with update and reminded him to stop fish oil 5 days prior to surgery.

## 2013-10-02 ENCOUNTER — Encounter: Payer: Self-pay | Admitting: Family Medicine

## 2013-10-02 ENCOUNTER — Ambulatory Visit (INDEPENDENT_AMBULATORY_CARE_PROVIDER_SITE_OTHER): Payer: 59 | Admitting: Family Medicine

## 2013-10-02 VITALS — BP 114/80 | Temp 97.6°F | Wt 211.0 lb

## 2013-10-02 DIAGNOSIS — R310 Gross hematuria: Secondary | ICD-10-CM

## 2013-10-02 DIAGNOSIS — N049 Nephrotic syndrome with unspecified morphologic changes: Secondary | ICD-10-CM

## 2013-10-02 DIAGNOSIS — N029 Recurrent and persistent hematuria with unspecified morphologic changes: Secondary | ICD-10-CM

## 2013-10-02 DIAGNOSIS — R319 Hematuria, unspecified: Secondary | ICD-10-CM

## 2013-10-02 LAB — POCT URINALYSIS DIPSTICK
Bilirubin, UA: NEGATIVE
Glucose, UA: NEGATIVE
Ketones, UA: NEGATIVE
Leukocytes, UA: NEGATIVE
Nitrite, UA: NEGATIVE
Spec Grav, UA: 1.02
Urobilinogen, UA: 0.2
pH, UA: 5

## 2013-10-02 NOTE — Progress Notes (Signed)
Chief Complaint  Patient presents with  . Hematuria    HPI:  Acute visit for  Hematuria: -reports saw gross blood in urine this morning, then clear again -per review of chart had blood in urine at physical in December and treated with cipro for 10 days -denies: GU pain, fevers, weight loss, NVD, anorexia, penile lesions or burning with urination, frequency or urgency, incontinence -does have some hesitancy at night -denies: hx of smoking, denies hx of kidney stones -hx of DVT after hospitalization remotely -wants to see urologist  ROS: See pertinent positives and negatives per HPI.  Past Medical History  Diagnosis Date  . Hypertension   . Hyperlipidemia   . Diabetes mellitus     type  II  . Ventral hernia     followed by Dr. Cyril Lopez  . Arthritis     Back  . DVT (deep venous thrombosis)   . Eczema   . Clotting disorder     Past Surgical History  Procedure Laterality Date  . Cholecystectomy    . Colonoscopy  01-27-06    per Dr. Sharlett Lopez, repeat in 10 yrs  . Lumbar laminectomy/decompression microdiscectomy  09/26/2012    Procedure: LUMBAR LAMINECTOMY/DECOMPRESSION MICRODISCECTOMY 1 LEVEL;  Surgeon: Bobby Hoops, MD;  Location: [facility];  Service: Neurosurgery;  Laterality: Left;  Lumbar Lamiectomy Extraforaminal Diskectomy Lumbar Three-Four Left    Family History  Problem Relation Age of Onset  . Diabetes Mother   . Hypertension Mother     History   Social History  . Marital Status: Married    Spouse Name: N/A    Number of Children: N/A  . Years of Education: N/A   Social History Main Topics  . Smoking status: Former Research scientist (life sciences)  . Smokeless tobacco: Never Used     Comment: quit > 46 years ago  . Alcohol Use: No  . Drug Use: No  . Sexual Activity: None   Other Topics Concern  . None   Social History Narrative  . None    Current outpatient prescriptions:amLODipine (NORVASC) 5 MG tablet, Take 5 mg by mouth daily., Disp: , Rfl: ;  atorvastatin  (LIPITOR) 20 MG tablet, Take 20 mg by mouth daily., Disp: , Rfl: ;  ciprofloxacin (CIPRO) 500 MG tablet, Take 1 tablet (500 mg total) by mouth 2 (two) times daily., Disp: 20 tablet, Rfl: 0;  Garlic TABS, Take 1 each by mouth daily. , Disp: , Rfl:  losartan-hydrochlorothiazide (HYZAAR) 50-12.5 MG per tablet, Take 1 tablet by mouth daily., Disp: , Rfl: ;  metFORMIN (GLUCOPHAGE) 500 MG tablet, Take 500 mg by mouth 2 (two) times daily with a meal., Disp: , Rfl: ;  Multiple Vitamin (MULTIVITAMIN WITH MINERALS) TABS, Take 1 tablet by mouth daily., Disp: , Rfl: ;  Omega-3 Fatty Acids (FISH OIL PO), Take 1 capsule by mouth daily., Disp: , Rfl:  OVER THE COUNTER MEDICATION, Take 1 tablet by mouth daily. Prostate health vitamin, Disp: , Rfl: ;  Probiotic Product (ALIGN PO), Take by mouth daily., Disp: , Rfl:  Current facility-administered medications:pneumococcal 23 valent vaccine (PNU-IMMUNE) injection 0.5 mL, 0.5 mL, Intramuscular, Once, Bobby Morale, MD  EXAMDanley Lopez Vitals:   10/02/13 1421  BP: 114/80  Temp: 97.6 F (36.4 C)    Body mass index is 29.44 kg/(m^2).  GENERAL: vitals reviewed and listed above, alert, oriented, appears well hydrated and in no acute distress  MS: moves all extremities without noticeable abnormality  PSYCH: pleasant and cooperative, no obvious depression  or anxiety  ASSESSMENT AND PLAN:  Discussed the following assessment and plan:  Hematuria - Plan: POCT urinalysis dipstick, Culture, Urine  Gross hematuria - Plan: Ambulatory referral to Urology  Recurrent and persistent hematuria - Plan: Ambulatory referral to Urology  -we discussed possible serious and likely etiologies, workup and treatment, treatment risks and return and emergency precautions -recurrent hematuria and did not have UTI on last culture -after this discussion, Bobby Lopez opted for urology referral, culture pending for today, though doubt infection -follow up advised with urologist as scheduled and  with PCP as needed -of course, we advised Bobby Lopez  to return or notify a doctor immediately if symptoms worsen or persist or new concerns arise.   -Patient advised to return or notify a doctor immediately if symptoms worsen or persist or new concerns arise.  Patient Instructions  -We placed a referral for you as discussed to the urologist. It usually takes about 1-2 weeks to process and schedule this referral. If you have not heard from [place] regarding this appointment in 2 weeks please contact our office.  -follow up as needed and immediately if worsening or change in symptoms     Bobby Lopez R.

## 2013-10-02 NOTE — Patient Instructions (Signed)
-  We placed a referral for you as discussed to the urologist. It usually takes about 1-2 weeks to process and schedule this referral. If you have not heard from [place] regarding this appointment in 2 weeks please contact our office.  -follow up as needed and immediately if worsening or change in symptoms

## 2013-10-02 NOTE — Progress Notes (Signed)
Pre visit review using our clinic review tool, if applicable. No additional management support is needed unless otherwise documented below in the visit note. 

## 2013-10-03 DIAGNOSIS — R319 Hematuria, unspecified: Secondary | ICD-10-CM | POA: Diagnosis not present

## 2013-10-05 DIAGNOSIS — N210 Calculus in bladder: Secondary | ICD-10-CM | POA: Diagnosis not present

## 2013-10-05 DIAGNOSIS — N401 Enlarged prostate with lower urinary tract symptoms: Secondary | ICD-10-CM | POA: Diagnosis not present

## 2013-10-05 DIAGNOSIS — N139 Obstructive and reflux uropathy, unspecified: Secondary | ICD-10-CM | POA: Diagnosis not present

## 2013-10-05 DIAGNOSIS — N138 Other obstructive and reflux uropathy: Secondary | ICD-10-CM | POA: Diagnosis not present

## 2013-10-05 LAB — URINE CULTURE
Colony Count: NO GROWTH
Organism ID, Bacteria: NO GROWTH

## 2013-10-06 ENCOUNTER — Encounter (HOSPITAL_COMMUNITY): Payer: Self-pay | Admitting: Pharmacy Technician

## 2013-10-07 NOTE — Pre-Procedure Instructions (Signed)
[facility]  10/07/2013   Your procedure is scheduled on:  February 5  Report to [facility] Entrance "A" [address] at [employer] AM.  Call this number if you have problems the morning of surgery: [phone]   Remember:   Do not eat food or drink liquids after midnight.   Take these medicines the morning of surgery with A SIP OF WATER: Amlodipine,    STOP Garlic, Multiple Vitamins, Fish Oil, Prostate Health January 29   STOP/ Do not take Aspirin, Aleve, Naproxen, Advil, Ibuprofen, Vitamin, Herbs, and Supplements starting January 29   Do not wear jewelry, make-up or nail polish.  Do not wear lotions, powders, or perfumes. You may wear deodorant.  Do not shave 48 hours prior to surgery. Men may shave face and neck.  Do not bring valuables to the hospital.  [facility] is not responsible                  for any belongings or valuables.               Contacts, dentures or bridgework may not be worn into surgery.  Leave suitcase in the car. After surgery it may be brought to your room.  For patients admitted to the hospital, discharge time is determined by your                treatment team.               Patients discharged the day of surgery will not be allowed to drive  home.  Name and phone number of your driver: Family/ Friend  Special Instructions: Shower using CHG 2 nights before surgery and the night before surgery.  If you shower the day of surgery use CHG.  Use special wash - you have one bottle of CHG for all showers.  You should use approximately 1/3 of the bottle for each shower.   Please read over the following fact sheets that you were given: Pain Booklet, Coughing and Deep Breathing and Surgical Site Infection Prevention

## 2013-10-09 ENCOUNTER — Ambulatory Visit (HOSPITAL_COMMUNITY)
Admission: RE | Admit: 2013-10-09 | Discharge: 2013-10-09 | Disposition: A | Discharge status: Home / Self Care | Payer: 59 | Source: Ambulatory Visit | Attending: General Surgery | Admitting: General Surgery

## 2013-10-09 ENCOUNTER — Encounter (HOSPITAL_COMMUNITY): Payer: Self-pay

## 2013-10-09 ENCOUNTER — Encounter (HOSPITAL_COMMUNITY)
Admission: RE | Admit: 2013-10-09 | Discharge: 2013-10-09 | Disposition: A | Discharge status: Home / Self Care | Payer: 59 | Source: Ambulatory Visit | Attending: General Surgery | Admitting: General Surgery

## 2013-10-09 DIAGNOSIS — Z01818 Encounter for other preprocedural examination: Secondary | ICD-10-CM | POA: Diagnosis not present

## 2013-10-09 DIAGNOSIS — Z01812 Encounter for preprocedural laboratory examination: Secondary | ICD-10-CM | POA: Insufficient documentation

## 2013-10-09 DIAGNOSIS — K439 Ventral hernia without obstruction or gangrene: Secondary | ICD-10-CM | POA: Insufficient documentation

## 2013-10-09 HISTORY — DX: Calculus in bladder: N21.0

## 2013-10-09 LAB — BASIC METABOLIC PANEL
BUN: 14 mg/dL (ref 6–23)
CO2: 26 mEq/L (ref 19–32)
Calcium: 9.2 mg/dL (ref 8.4–10.5)
Chloride: 101 mEq/L (ref 96–112)
Creatinine, Ser: 0.92 mg/dL (ref 0.50–1.35)
GFR calc Af Amer: >90 mL/min (ref >90)
GFR calc non Af Amer: 82 mL/min — ABNORMAL LOW (ref >90)
Glucose, Bld: 195 mg/dL — ABNORMAL HIGH (ref 70–99)
Potassium: 4.2 mEq/L (ref 3.7–5.3)
Sodium: 139 mEq/L (ref 137–147)

## 2013-10-09 LAB — CBC
HCT: 41.6 % (ref 39.0–52.0)
Hemoglobin: 14.8 g/dL (ref 13.0–17.0)
MCH: 31.3 pg (ref 26.0–34.0)
MCHC: 35.6 g/dL (ref 30.0–36.0)
MCV: 87.9 fL (ref 78.0–100.0)
Platelets: 181 10*3/uL (ref 150–400)
RBC: 4.73 MIL/uL (ref 4.22–5.81)
RDW: 13.1 % (ref 11.5–15.5)
WBC: 8.5 10*3/uL (ref 4.0–10.5)

## 2013-10-13 ENCOUNTER — Other Ambulatory Visit: Payer: Self-pay | Admitting: Family Medicine

## 2013-10-13 ENCOUNTER — Other Ambulatory Visit: Payer: Self-pay | Admitting: Urology

## 2013-10-13 NOTE — Telephone Encounter (Signed)
Pt needs refills on amlodipine 5 mg #90,atorvastatin 20 mg390 and losartan-hctz 50-12.5mg  #90 and metformin 500 mg #180 w/refills [facility] [employer]

## 2013-10-18 MED ORDER — CHLORHEXIDINE GLUCONATE 4 % EX LIQD: 1.0000 "application " | Freq: Once | CUTANEOUS | Status: DC

## 2013-10-18 MED ORDER — CEFAZOLIN SODIUM-DEXTROSE 2-3 GM-% IV SOLR
2.0000 g | INTRAVENOUS | Status: AC
  Administered 2013-10-19: 2 g via INTRAVENOUS
  Filled 2013-10-18: qty 50

## 2013-10-18 MED ORDER — CIPROFLOXACIN IN D5W 400 MG/200ML IV SOLN: 400.0000 mg | INTRAVENOUS | Status: DC

## 2013-10-19 ENCOUNTER — Ambulatory Visit (HOSPITAL_COMMUNITY): Payer: Medicare Other | Admitting: Anesthesiology

## 2013-10-19 ENCOUNTER — Encounter (HOSPITAL_COMMUNITY): Payer: Medicare Other | Admitting: Anesthesiology

## 2013-10-19 ENCOUNTER — Inpatient Hospital Stay (HOSPITAL_COMMUNITY)
Admission: RE | Admit: 2013-10-19 | Discharge: 2013-10-21 | DRG: 355 | Disposition: A | Discharge status: Home / Self Care | Payer: Medicare Other | Source: Ambulatory Visit | Attending: General Surgery | Admitting: General Surgery

## 2013-10-19 ENCOUNTER — Encounter (HOSPITAL_COMMUNITY): Payer: Self-pay | Admitting: Surgery

## 2013-10-19 ENCOUNTER — Encounter (HOSPITAL_COMMUNITY)
Admission: RE | Disposition: A | Discharge status: Home / Self Care | Payer: Self-pay | Source: Ambulatory Visit | Attending: General Surgery

## 2013-10-19 DIAGNOSIS — K439 Ventral hernia without obstruction or gangrene: Principal | ICD-10-CM | POA: Diagnosis present

## 2013-10-19 DIAGNOSIS — Z87891 Personal history of nicotine dependence: Secondary | ICD-10-CM

## 2013-10-19 DIAGNOSIS — M129 Arthropathy, unspecified: Secondary | ICD-10-CM | POA: Diagnosis present

## 2013-10-19 DIAGNOSIS — I10 Essential (primary) hypertension: Secondary | ICD-10-CM | POA: Diagnosis not present

## 2013-10-19 DIAGNOSIS — Z86718 Personal history of other venous thrombosis and embolism: Secondary | ICD-10-CM

## 2013-10-19 DIAGNOSIS — E119 Type 2 diabetes mellitus without complications: Secondary | ICD-10-CM | POA: Diagnosis present

## 2013-10-19 DIAGNOSIS — E785 Hyperlipidemia, unspecified: Secondary | ICD-10-CM | POA: Diagnosis present

## 2013-10-19 DIAGNOSIS — K432 Incisional hernia without obstruction or gangrene: Secondary | ICD-10-CM

## 2013-10-19 DIAGNOSIS — Z981 Arthrodesis status: Secondary | ICD-10-CM | POA: Diagnosis not present

## 2013-10-19 HISTORY — PX: INSERTION OF MESH: SHX5868

## 2013-10-19 HISTORY — PX: VENTRAL HERNIA REPAIR: SHX424

## 2013-10-19 LAB — CBC
HCT: 40.6 % (ref 39.0–52.0)
Hemoglobin: 14.6 g/dL (ref 13.0–17.0)
MCH: 31.7 pg (ref 26.0–34.0)
MCHC: 36 g/dL (ref 30.0–36.0)
MCV: 88.1 fL (ref 78.0–100.0)
Platelets: 160 10*3/uL (ref 150–400)
RBC: 4.61 MIL/uL (ref 4.22–5.81)
RDW: 12.8 % (ref 11.5–15.5)
WBC: 13 10*3/uL — ABNORMAL HIGH (ref 4.0–10.5)

## 2013-10-19 LAB — CREATININE, SERUM
Creatinine, Ser: 0.93 mg/dL (ref 0.50–1.35)
GFR calc Af Amer: >90 mL/min (ref >90)
GFR calc non Af Amer: 82 mL/min — ABNORMAL LOW (ref >90)

## 2013-10-19 LAB — GLUCOSE, CAPILLARY
Glucose-Capillary: 171 mg/dL — ABNORMAL HIGH (ref 70–99)
Glucose-Capillary: 189 mg/dL — ABNORMAL HIGH (ref 70–99)

## 2013-10-19 SURGERY — REPAIR, HERNIA, VENTRAL, LAPAROSCOPIC
Anesthesia: General | Site: Abdomen

## 2013-10-19 MED ORDER — ONDANSETRON HCL 4 MG/2ML IJ SOLN
INTRAMUSCULAR | Status: DC | PRN
  Administered 2013-10-19: 4 mg via INTRAVENOUS

## 2013-10-19 MED ORDER — DEXTROSE 5 % IV SOLN
INTRAVENOUS | Status: DC | PRN
  Administered 2013-10-19: 08:00:00 via INTRAVENOUS

## 2013-10-19 MED ORDER — METHOCARBAMOL 500 MG PO TABS: 500.0000 mg | ORAL_TABLET | Freq: Three times a day (TID) | ORAL | Status: DC | PRN

## 2013-10-19 MED ORDER — OXYCODONE-ACETAMINOPHEN 5-325 MG PO TABS
1.0000 | ORAL_TABLET | ORAL | Status: DC | PRN
  Administered 2013-10-19 – 2013-10-21 (×5): 1 via ORAL
  Filled 2013-10-19 (×5): qty 1

## 2013-10-19 MED ORDER — PROPOFOL 10 MG/ML IV BOLUS
INTRAVENOUS | Status: AC
  Filled 2013-10-19: qty 20

## 2013-10-19 MED ORDER — MEPERIDINE HCL 25 MG/ML IJ SOLN: 6.2500 mg | INTRAMUSCULAR | Status: DC | PRN

## 2013-10-19 MED ORDER — NEOSTIGMINE METHYLSULFATE 1 MG/ML IJ SOLN
INTRAMUSCULAR | Status: AC
  Filled 2013-10-19: qty 10

## 2013-10-19 MED ORDER — KCL IN DEXTROSE-NACL 20-5-0.9 MEQ/L-%-% IV SOLN
INTRAVENOUS | Status: DC
  Administered 2013-10-19 – 2013-10-20 (×3): via INTRAVENOUS
  Filled 2013-10-19 (×5): qty 1000

## 2013-10-19 MED ORDER — SODIUM CHLORIDE 0.9 % IR SOLN
Status: DC | PRN
  Administered 2013-10-19: 1000 mL

## 2013-10-19 MED ORDER — TRAMADOL HCL 50 MG PO TABS
50.0000 mg | ORAL_TABLET | Freq: Four times a day (QID) | ORAL | Status: DC | PRN
  Administered 2013-10-19: 50 mg via ORAL
  Filled 2013-10-19: qty 1

## 2013-10-19 MED ORDER — ONDANSETRON HCL 4 MG/2ML IJ SOLN
INTRAMUSCULAR | Status: AC
  Filled 2013-10-19: qty 2

## 2013-10-19 MED ORDER — FENTANYL CITRATE 0.05 MG/ML IJ SOLN: 50.0000 ug | INTRAMUSCULAR | Status: DC | PRN

## 2013-10-19 MED ORDER — OXYCODONE HCL 5 MG/5ML PO SOLN: 5.0000 mg | Freq: Once | ORAL | Status: DC | PRN

## 2013-10-19 MED ORDER — PROPOFOL 10 MG/ML IV BOLUS
INTRAVENOUS | Status: DC | PRN
  Administered 2013-10-19: 200 mg via INTRAVENOUS

## 2013-10-19 MED ORDER — HYDROCHLOROTHIAZIDE 12.5 MG PO CAPS
12.5000 mg | ORAL_CAPSULE | Freq: Every day | ORAL | Status: DC
  Administered 2013-10-19 – 2013-10-20 (×2): 12.5 mg via ORAL
  Filled 2013-10-19 (×3): qty 1

## 2013-10-19 MED ORDER — NEOSTIGMINE METHYLSULFATE 1 MG/ML IJ SOLN
INTRAMUSCULAR | Status: DC | PRN
  Administered 2013-10-19: 5 mg via INTRAVENOUS

## 2013-10-19 MED ORDER — FENTANYL CITRATE 0.05 MG/ML IJ SOLN
INTRAMUSCULAR | Status: AC
  Filled 2013-10-19: qty 5

## 2013-10-19 MED ORDER — LACTATED RINGERS IV SOLN
INTRAVENOUS | Status: DC | PRN
  Administered 2013-10-19 (×2): via INTRAVENOUS

## 2013-10-19 MED ORDER — ROCURONIUM BROMIDE 100 MG/10ML IV SOLN
INTRAVENOUS | Status: DC | PRN
  Administered 2013-10-19: 50 mg via INTRAVENOUS

## 2013-10-19 MED ORDER — OXYCODONE HCL 5 MG PO TABS: 5.0000 mg | ORAL_TABLET | Freq: Once | ORAL | Status: DC | PRN

## 2013-10-19 MED ORDER — FENTANYL CITRATE 0.05 MG/ML IJ SOLN
INTRAMUSCULAR | Status: DC | PRN
  Administered 2013-10-19: 50 ug via INTRAVENOUS
  Administered 2013-10-19: 150 ug via INTRAVENOUS
  Administered 2013-10-19: 50 ug via INTRAVENOUS

## 2013-10-19 MED ORDER — PROMETHAZINE HCL 25 MG/ML IJ SOLN: 6.2500 mg | INTRAMUSCULAR | Status: DC | PRN

## 2013-10-19 MED ORDER — ARTIFICIAL TEARS OP OINT
TOPICAL_OINTMENT | OPHTHALMIC | Status: AC
  Filled 2013-10-19: qty 3.5

## 2013-10-19 MED ORDER — MIDAZOLAM HCL 2 MG/2ML IJ SOLN
INTRAMUSCULAR | Status: AC
  Filled 2013-10-19: qty 2

## 2013-10-19 MED ORDER — ROCURONIUM BROMIDE 50 MG/5ML IV SOLN
INTRAVENOUS | Status: AC
  Filled 2013-10-19: qty 1

## 2013-10-19 MED ORDER — ATORVASTATIN CALCIUM 20 MG PO TABS
20.0000 mg | ORAL_TABLET | Freq: Every day | ORAL | Status: DC
  Administered 2013-10-19 – 2013-10-20 (×2): 20 mg via ORAL
  Filled 2013-10-19 (×3): qty 1

## 2013-10-19 MED ORDER — AMLODIPINE BESYLATE 5 MG PO TABS
5.0000 mg | ORAL_TABLET | Freq: Every day | ORAL | Status: DC
  Administered 2013-10-20: 5 mg via ORAL
  Filled 2013-10-19 (×2): qty 1

## 2013-10-19 MED ORDER — GLYCOPYRROLATE 0.2 MG/ML IJ SOLN
INTRAMUSCULAR | Status: AC
  Filled 2013-10-19: qty 4

## 2013-10-19 MED ORDER — BUPIVACAINE-EPINEPHRINE 0.25% -1:200000 IJ SOLN
INTRAMUSCULAR | Status: DC | PRN
  Administered 2013-10-19: 12 mL

## 2013-10-19 MED ORDER — GLYCOPYRROLATE 0.2 MG/ML IJ SOLN
INTRAMUSCULAR | Status: DC | PRN
  Administered 2013-10-19: .8 mg via INTRAVENOUS

## 2013-10-19 MED ORDER — LOSARTAN POTASSIUM-HCTZ 50-12.5 MG PO TABS: 1.0000 | ORAL_TABLET | Freq: Every day | ORAL | Status: DC

## 2013-10-19 MED ORDER — LIDOCAINE HCL (CARDIAC) 20 MG/ML IV SOLN
INTRAVENOUS | Status: AC
  Filled 2013-10-19: qty 5

## 2013-10-19 MED ORDER — LOSARTAN POTASSIUM 50 MG PO TABS
50.0000 mg | ORAL_TABLET | Freq: Every day | ORAL | Status: DC
  Administered 2013-10-19 – 2013-10-20 (×2): 50 mg via ORAL
  Filled 2013-10-19 (×3): qty 1

## 2013-10-19 MED ORDER — ARTIFICIAL TEARS OP OINT
TOPICAL_OINTMENT | OPHTHALMIC | Status: DC | PRN
  Administered 2013-10-19: 1 via OPHTHALMIC

## 2013-10-19 MED ORDER — METFORMIN HCL 500 MG PO TABS
500.0000 mg | ORAL_TABLET | Freq: Two times a day (BID) | ORAL | Status: DC
Start: 2013-10-22 — End: 2013-10-21
  Filled 2013-10-19: qty 1

## 2013-10-19 MED ORDER — LIDOCAINE HCL (CARDIAC) 20 MG/ML IV SOLN
INTRAVENOUS | Status: DC | PRN
  Administered 2013-10-19: 40 mg via INTRAVENOUS

## 2013-10-19 MED ORDER — ONDANSETRON HCL 4 MG PO TABS
4.0000 mg | ORAL_TABLET | Freq: Four times a day (QID) | ORAL | Status: DC | PRN
  Administered 2013-10-20 (×2): 4 mg via ORAL
  Filled 2013-10-19 (×3): qty 1

## 2013-10-19 MED ORDER — PHENYLEPHRINE HCL 10 MG/ML IJ SOLN
10.0000 mg | INTRAVENOUS | Status: DC | PRN
  Administered 2013-10-19: 40 ug/min via INTRAVENOUS

## 2013-10-19 MED ORDER — ONDANSETRON HCL 4 MG/2ML IJ SOLN: 4.0000 mg | Freq: Four times a day (QID) | INTRAMUSCULAR | Status: DC | PRN

## 2013-10-19 MED ORDER — HEPARIN SODIUM (PORCINE) 5000 UNIT/ML IJ SOLN
5000.0000 [IU] | Freq: Three times a day (TID) | INTRAMUSCULAR | Status: DC
  Administered 2013-10-20 – 2013-10-21 (×4): 5000 [IU] via SUBCUTANEOUS
  Filled 2013-10-19 (×7): qty 1

## 2013-10-19 MED ORDER — BUPIVACAINE-EPINEPHRINE (PF) 0.25% -1:200000 IJ SOLN
INTRAMUSCULAR | Status: AC
  Filled 2013-10-19: qty 30

## 2013-10-19 MED ORDER — HYDROMORPHONE HCL PF 1 MG/ML IJ SOLN
INTRAMUSCULAR | Status: AC
  Filled 2013-10-19: qty 1

## 2013-10-19 MED ORDER — HYDROMORPHONE HCL PF 1 MG/ML IJ SOLN
0.2500 mg | INTRAMUSCULAR | Status: DC | PRN
  Administered 2013-10-19 (×2): 0.5 mg via INTRAVENOUS

## 2013-10-19 SURGICAL SUPPLY — 54 items
ADH SKN CLS APL DERMABOND .7 (GAUZE/BANDAGES/DRESSINGS) ×1
APPLIER CLIP ROT 10 11.4 M/L (STAPLE)
APR CLP MED LRG 11.4X10 (STAPLE)
BANDAGE GAUZE ELAST BULKY 4 IN (GAUZE/BANDAGES/DRESSINGS) IMPLANT
BINDER ABD UNIV 10 28-50 (GAUZE/BANDAGES/DRESSINGS) IMPLANT
BINDER ABDOM UNIV 10 (GAUZE/BANDAGES/DRESSINGS) ×2
CANISTER SUCTION 2500CC (MISCELLANEOUS) IMPLANT
CHLORAPREP W/TINT 26ML (MISCELLANEOUS) ×2 IMPLANT
CLIP APPLIE ROT 10 11.4 M/L (STAPLE) IMPLANT
COVER SURGICAL LIGHT HANDLE (MISCELLANEOUS) ×2 IMPLANT
DECANTER SPIKE VIAL GLASS SM (MISCELLANEOUS) ×2 IMPLANT
DERMABOND ADVANCED (GAUZE/BANDAGES/DRESSINGS) ×1
DERMABOND ADVANCED .7 DNX12 (GAUZE/BANDAGES/DRESSINGS) ×1 IMPLANT
DEVICE SECURE STRAP 25 ABSORB (INSTRUMENTS) ×3 IMPLANT
DEVICE TROCAR PUNCTURE CLOSURE (ENDOMECHANICALS) ×2 IMPLANT
DRAPE INCISE IOBAN 66X45 STRL (DRAPES) ×2 IMPLANT
DRAPE UTILITY 15X26 W/TAPE STR (DRAPE) ×4 IMPLANT
ELECT CAUTERY BLADE 6.4 (BLADE) ×2 IMPLANT
ELECT REM PT RETURN 9FT ADLT (ELECTROSURGICAL) ×2
ELECTRODE REM PT RTRN 9FT ADLT (ELECTROSURGICAL) ×1 IMPLANT
GAUZE SPONGE 2X2 8PLY STRL LF (GAUZE/BANDAGES/DRESSINGS) ×1 IMPLANT
GLOVE BIO SURGEON STRL SZ 6.5 (GLOVE) ×2 IMPLANT
GLOVE BIO SURGEON STRL SZ7.5 (GLOVE) ×2 IMPLANT
GLOVE BIO SURGEON STRL SZ8 (GLOVE) ×1 IMPLANT
GLOVE BIOGEL PI IND STRL 7.0 (GLOVE) ×2 IMPLANT
GLOVE BIOGEL PI IND STRL 8 (GLOVE) IMPLANT
GLOVE BIOGEL PI INDICATOR 7.0 (GLOVE) ×2
GLOVE BIOGEL PI INDICATOR 8 (GLOVE) ×3
GLOVE SURG SS PI 7.0 STRL IVOR (GLOVE) ×1 IMPLANT
GOWN STRL NON-REIN LRG LVL3 (GOWN DISPOSABLE) ×8 IMPLANT
KIT BASIN OR (CUSTOM PROCEDURE TRAY) ×2 IMPLANT
KIT ROOM TURNOVER OR (KITS) ×2 IMPLANT
MARKER SKIN DUAL TIP RULER LAB (MISCELLANEOUS) ×2 IMPLANT
MESH VENTRALIGHT ST 4X6IN (Mesh General) ×1 IMPLANT
NDL SPNL 22GX3.5 QUINCKE BK (NEEDLE) ×1 IMPLANT
NEEDLE SPNL 22GX3.5 QUINCKE BK (NEEDLE) ×2 IMPLANT
NS IRRIG 1000ML POUR BTL (IV SOLUTION) ×2 IMPLANT
PAD ARMBOARD 7.5X6 YLW CONV (MISCELLANEOUS) ×3 IMPLANT
PENCIL BUTTON HOLSTER BLD 10FT (ELECTRODE) ×2 IMPLANT
SCALPEL HARMONIC ACE (MISCELLANEOUS) ×2 IMPLANT
SCISSORS LAP 5X35 DISP (ENDOMECHANICALS) IMPLANT
SLEEVE ENDOPATH XCEL 5M (ENDOMECHANICALS) ×2 IMPLANT
SPONGE GAUZE 2X2 STER 10/PKG (GAUZE/BANDAGES/DRESSINGS) ×1
SPONGE GAUZE 4X4 12PLY (GAUZE/BANDAGES/DRESSINGS) ×2 IMPLANT
STAPLER VISISTAT 35W (STAPLE) ×1 IMPLANT
SUT MNCRL AB 4-0 PS2 18 (SUTURE) ×2 IMPLANT
SUT NOVA NAB DX-16 0-1 5-0 T12 (SUTURE) ×4 IMPLANT
TAPE CLOTH SURG 4X10 WHT LF (GAUZE/BANDAGES/DRESSINGS) ×1 IMPLANT
TOWEL OR 17X24 6PK STRL BLUE (TOWEL DISPOSABLE) ×2 IMPLANT
TOWEL OR 17X26 10 PK STRL BLUE (TOWEL DISPOSABLE) ×2 IMPLANT
TRAY FOLEY CATH 16FR SILVER (SET/KITS/TRAYS/PACK) ×2 IMPLANT
TRAY LAPAROSCOPIC (CUSTOM PROCEDURE TRAY) ×2 IMPLANT
TROCAR XCEL NON-BLD 11X100MML (ENDOMECHANICALS) ×2 IMPLANT
TROCAR XCEL NON-BLD 5MMX100MML (ENDOMECHANICALS) ×2 IMPLANT

## 2013-10-19 NOTE — Anesthesia Preprocedure Evaluation (Addendum)
Anesthesia Evaluation  Patient identified by MRN, date of birth, ID band Patient awake    Reviewed: Allergy & Precautions, H&P , NPO status , Patient's Chart, lab work & pertinent test results, reviewed documented beta blocker date and time   History of Anesthesia Complications Negative for: history of anesthetic complications  Airway Mallampati: II TM Distance: >3 FB Neck ROM: Full    Dental  (+) Dental Advisory Given and Teeth Intact   Pulmonary former smoker (quit 1964),  breath sounds clear to auscultation  Pulmonary exam normal       Cardiovascular hypertension, Pt. on medications DVT (1987) Rhythm:Regular Rate:Normal     Neuro/Psych negative neurological ROS     GI/Hepatic negative GI ROS, Neg liver ROS,   Endo/Other  diabetes (glu 171), Well Controlled, Type 2, Oral Hypoglycemic Agents  Renal/GU negative Renal ROS     Musculoskeletal   Abdominal   Peds  Hematology negative hematology ROS (+)   Anesthesia Other Findings   Reproductive/Obstetrics                          Anesthesia Physical Anesthesia Plan  ASA: II  Anesthesia Plan: General   Post-op Pain Management:    Induction: Intravenous  Airway Management Planned: Oral ETT  Additional Equipment:   Intra-op Plan:   Post-operative Plan: Extubation in OR  Informed Consent: I have reviewed the patients History and Physical, chart, labs and discussed the procedure including the risks, benefits and alternatives for the proposed anesthesia with the patient or authorized representative who has indicated his/her understanding and acceptance.   Dental advisory given  Plan Discussed with: CRNA and Surgeon  Anesthesia Plan Comments: (Plan routine monitors, GETA)        Anesthesia Quick Evaluation

## 2013-10-19 NOTE — Anesthesia Postprocedure Evaluation (Signed)
  Anesthesia Post-op Note  Patient: Bobby Lopez [facility]  Procedure(s) Performed: Procedure(s): LAPAROSCOPIC VENTRAL HERNIA (N/A) INSERTION OF MESH (N/A)  Patient Location: PACU  Anesthesia Type:General  Level of Consciousness: awake, alert , oriented and patient cooperative  Airway and Oxygen Therapy: Patient Spontanous Breathing and Patient connected to nasal cannula oxygen  Post-op Pain: none  Post-op Assessment: Post-op Vital signs reviewed, Patient's Cardiovascular Status Stable, Respiratory Function Stable, Patent Airway, No signs of Nausea or vomiting and Pain level controlled  Post-op Vital Signs: Reviewed and stable  Complications: No apparent anesthesia complications

## 2013-10-19 NOTE — H&P (Signed)
Bobby Lopez [facility]  08/24/2013 9:10 AM   Office Visit  MRN:  [id number]   Description: 73 year old male  Provider: Merrie Roof, MD  Department: [facility]          Diagnoses      Incisional hernia without mention of obstruction or gangrene    -  Primary      553.21             Reason for Visit      Hernia             Current Vitals - Last Recorded      BP Pulse Temp(Src) Resp Ht Wt      129/86 84 97.7 F (36.5 C) (Temporal) 18 5\' 11"  (1.803 m) 211 lb 3.2 oz (95.8 kg)            BMI                29.47 kg/m2                        Progress Notes      Merrie Roof, MD at 08/24/2013  9:31 AM      Status: Signed            Patient ID: Bobby Lopez, male   DOB: 04/21/1941, 73 y.o.   MRN: [id number]    Chief Complaint   Patient presents with   .  Hernia        HPI Bobby Lopez is a 74 y.o. male.  We are asked to see the patient in consultation by Bobby Lopez to evaluate him for a ventral hernia. The patient is a 73 year old white male who has a history of open cholecystectomy in 50 in [place]. Over the last few years he has developed a small bowel just along the upper portion of the incision. He denies any pain with this. He denies any nausea or vomiting. He does think it may have gotten a little bit larger since he saw Bobby Lopez for this about 2 years ago. His appetite is good his bowels are working normally. HPI    Past Medical History   Diagnosis  Date   .  Hypertension     .  Hyperlipidemia     .  Diabetes mellitus         type  II   .  Ventral hernia         followed by Bobby Lopez   .  Arthritis         Back   .  DVT (deep venous thrombosis)     .  Eczema     .  Clotting disorder           Past Surgical History   Procedure  Laterality  Date   .  Cholecystectomy       .  Colonoscopy    01-27-06       per Bobby Lopez, repeat in 10 yrs   .  Lumbar laminectomy/decompression microdiscectomy    09/26/2012       Procedure:  LUMBAR LAMINECTOMY/DECOMPRESSION MICRODISCECTOMY 1 LEVEL;  Surgeon: Bobby Hoops, MD;  Location: [place] [facility];  Service: Neurosurgery;  Laterality: Left;  Lumbar Lamiectomy Extraforaminal Diskectomy Lumbar Three-Four Left         Family History   Problem  Relation  Age of Onset   .  Diabetes  Mother     .  Hypertension  Mother          Social History History   Substance Use Topics   .  Smoking status:  Former Research scientist (life sciences)   .  Smokeless tobacco:  Never Used         Comment: quit > 46 years ago   .  Alcohol Use:  No         Allergies   Allergen  Reactions   .  Ace Inhibitors         cough   .  Hydrocodone         REACTION: nausea         Current Outpatient Prescriptions   Medication  Sig  Dispense  Refill   .  amLODipine (NORVASC) 5 MG tablet  Take 5 mg by mouth daily.         Marland Kitchen  atorvastatin (LIPITOR) 20 MG tablet  Take 20 mg by mouth daily.         .  ciprofloxacin (CIPRO) 500 MG tablet  Take 1 tablet (500 mg total) by mouth 2 (two) times daily.   20 tablet   0   .  Garlic TABS  Take 1 each by mouth daily.          Marland Kitchen  losartan-hydrochlorothiazide (HYZAAR) 50-12.5 MG per tablet  Take 1 tablet by mouth daily.         .  metFORMIN (GLUCOPHAGE) 500 MG tablet  Take 500 mg by mouth 2 (two) times daily with a meal.         .  Multiple Vitamin (MULTIVITAMIN WITH MINERALS) TABS  Take 1 tablet by mouth daily.         .  Omega-3 Fatty Acids (FISH OIL PO)  Take 1 capsule by mouth daily.         Marland Kitchen  OVER THE COUNTER MEDICATION  Take 1 tablet by mouth daily. Prostate health vitamin         .  Probiotic Product (ALIGN PO)  Take by mouth daily.             Current Facility-Administered Medications   Medication  Dose  Route  Frequency  Provider  Last Rate  Last Dose   .  pneumococcal 23 valent vaccine (PNU-IMMUNE) injection 0.5 mL   0.5 mL  Intramuscular  Once  Laurey Morale, MD              Review of Systems Review of Systems  Constitutional: Negative.   HENT: Negative.   Eyes:  Negative.   Respiratory: Negative.   Cardiovascular: Negative.   Gastrointestinal: Negative.   Endocrine: Negative.   Genitourinary: Negative.   Musculoskeletal: Negative.   Skin: Negative.   Allergic/Immunologic: Negative.   Neurological: Negative.   Hematological: Negative.   Psychiatric/Behavioral: Negative.       Blood pressure 129/86, pulse 84, temperature 97.7 F (36.5 C), temperature source Temporal, resp. rate 18, height 5\' 11"  (1.803 m), weight 211 lb 3.2 oz (95.8 kg).   Physical Exam Physical Exam  Constitutional: He is oriented to person, place, and time. He appears well-developed and well-nourished.  HENT:   Head: Normocephalic and atraumatic.  Eyes: Conjunctivae and EOM are normal. Pupils are equal, round, and reactive to light.  Neck: Normal range of motion. Neck supple.  Cardiovascular: Normal rate, regular rhythm and normal heart sounds.   Pulmonary/Chest: Effort normal and breath sounds normal.  Abdominal: Soft. Bowel sounds  are normal.  There is a moderate-sized bulge along the upper portion of his old incision. The fascial defect is probably 4-5 cm across. The hernia seems to reduce fairly readily. There is no evidence of obstruction.  Musculoskeletal: Normal range of motion.  Neurological: He is alert and oriented to person, place, and time.  Skin: Skin is warm.  Psychiatric: He has a normal mood and affect. His behavior is normal.      Data Reviewed As above   Assessment    The patient has a small to moderate size ventral hernia from his previous operation. Because of the risk of incarceration strain patient I think he may benefit from having the hernia fixed. He is not sure he wants to undergo surgery at this point. I discussed with them in detail the risks and benefits of the operation a fixed hernia as well as some of the technical aspects including the risk of mesh infection and the risk of injury to intra-abdominal organs and he seems to  understand. He would like to think about it and will let us know if he would like to schedule surgery.      Plan    Plan for laparoscopic assisted ventral hernia repair with mesh if the patient is agreeable.

## 2013-10-19 NOTE — Transfer of Care (Signed)
Immediate Anesthesia Transfer of Care Note  Patient: Bobby Lopez  Procedure(s) Performed: Procedure(s): LAPAROSCOPIC VENTRAL HERNIA (N/A) INSERTION OF MESH (N/A)  Patient Location: PACU  Anesthesia Type:General  Level of Consciousness: awake and patient cooperative  Airway & Oxygen Therapy: Patient Spontanous Breathing and Patient connected to nasal cannula oxygen  Post-op Assessment: Report given to PACU RN, Post -op Vital signs reviewed and stable and Patient moving all extremities  Post vital signs: Reviewed and stable  Complications: No apparent anesthesia complications

## 2013-10-19 NOTE — Anesthesia Procedure Notes (Signed)
Procedure Name: Intubation Date/Time: 10/19/2013 7:42 AM Performed by: Terrill Mohr Pre-anesthesia Checklist: Patient identified, Emergency Drugs available, Suction available and Patient being monitored Patient Re-evaluated:Patient Re-evaluated prior to inductionOxygen Delivery Method: Circle system utilized Preoxygenation: Pre-oxygenation with 100% oxygen Intubation Type: IV induction Ventilation: Mask ventilation without difficulty and Oral airway inserted - appropriate to patient size Laryngoscope Size: Mac and 4 Grade View: Grade II Tube type: Oral Tube size: 7.5 mm Number of attempts: 1 Airway Equipment and Method: Stylet Placement Confirmation: ETT inserted through vocal cords under direct vision,  breath sounds checked- equal and bilateral and positive ETCO2 Secured at: 23 (cm at teeth) cm Tube secured with: Tape Dental Injury: Teeth and Oropharynx as per pre-operative assessment  Comments: Short distance chin to cricoid which was not appreciated when pt was awake and sitting up in bed.Marland KitchenMarland Kitchen

## 2013-10-19 NOTE — Preoperative (Signed)
Beta Blockers   Reason not to administer Beta Blockers:Not Applicable 

## 2013-10-19 NOTE — Op Note (Signed)
10/19/2013  9:24 AM  PATIENT:  Bobby Lopez  73 y.o. male  PRE-OPERATIVE DIAGNOSIS:  Ventral hernia   POST-OPERATIVE DIAGNOSIS:  Ventral hernia   PROCEDURE:  Procedure(s): LAPAROSCOPIC VENTRAL HERNIA (N/A) INSERTION OF MESH (N/A)  SURGEON:  Surgeon(s) and Role:    * Merrie Roof, MD - Primary    * Leighton Ruff, MD - Assisting  PHYSICIAN ASSISTANT:   ASSISTANTS: Dr. Marcello Moores   ANESTHESIA:   general  EBL:  Total I/O In: 50 [I.V.:50] Out: 100 [Urine:100]  BLOOD ADMINISTERED:none  DRAINS: none   LOCAL MEDICATIONS USED:  MARCAINE     SPECIMEN:  No Specimen  DISPOSITION OF SPECIMEN:  N/A  COUNTS:  YES  TOURNIQUET:  * No tourniquets in log *  DICTATION: .Dragon Dictation After informed consent was obtained the patient was brought to the operating room and placed in the supine position on the operating room table. After adequate induction of general anesthesia the patient's abdomen was prepped with ChloraPrep, allowed to dry, and draped in the usual sterile manner including the use of an Ioban drape. A site was chosen left midabdomen for placement of an Optiview port. This area was infiltrated with quarter percent Marcaine. A small stab incision was made with a 10 blade knife. A 5 mm Optiview port with camera was used to bluntly dissect through the layers of the abdominal wall under direct vision until access was gained to the abdominal cavity. The abdomen was then insufflated with carbon dioxide without difficulty. The abdomen was inspected and there were omental adhesions to the upper abdominal wall. Another 5 mm port was placed in the left lower quadrant under direct vision. Using laparoscopic scissors and the harmonic scalpel the omental adhesions were taken down. No loops of intestine were encountered adherent to the abdominal wall. 2 small hernia defects were identified. The omentum within the hernia defects was able to be reduced by gentle traction. Once this was  accomplished the abdominal wall around the hernia defects was completely cleared. A 10 x 15 cm piece of ventral light mesh was chosen. 4 #1 Novafil stitches were placed at equidistant points around the edge of the mesh. A small incision was made through the upper hernia defect with a 15 blade knife and electrocautery. The mesh was moistened and then placed through this opening in the abdominal cavity in the appropriate orientation. The fascial defect was then closed with 3 interrupted #1 Novafil stitches. The abdomen was then insufflated again. 4 small stab incisions were made at points that corresponded to the placement of the stitches. A suture passer was then used to bring the tails of each of these stitches to the abdominal wall. Each of these stitches was then cinched down and tied. The mesh nicely approximated the anterior abdominal wall. The gaps between the stitches were filled in with absorbable tacks. Once this was accomplished there were no gaps or redundancy in the mesh. The mesh was nicely approximated up against the anterior abdominal wall and completely covered the hernia defects. The area was examined and found to be hemostatic. At this point the gas was allowed to escape and the ports were removed. The mesh was visualized to be in good position. The incisions were then closed with staples and sterile dressings were applied. The patient tolerated the procedure well. At the end of the case all needle sponge and instrument counts were correct. The patient was then awakened and taken to recovery in stable condition.  PLAN OF  CARE: Admit to inpatient   PATIENT DISPOSITION:  PACU - hemodynamically stable.   Delay start of Pharmacological VTE agent (>24hrs) due to surgical blood loss or risk of bleeding: no

## 2013-10-19 NOTE — Interval H&P Note (Signed)
History and Physical Interval Note:  10/19/2013 7:11 AM  Bobby Lopez  has presented today for surgery, with the diagnosis of ventral hernia   The various methods of treatment have been discussed with the patient and family. After consideration of risks, benefits and other options for treatment, the patient has consented to  Procedure(s): LAPAROSCOPIC VENTRAL HERNIA (N/A) INSERTION OF MESH (N/A) as a surgical intervention .  The patient's history has been reviewed, patient examined, no change in status, stable for surgery.  I have reviewed the patient's chart and labs.  Questions were answered to the patient's satisfaction.     TOTH III,Roshini Fulwider S

## 2013-10-20 LAB — BASIC METABOLIC PANEL
BUN: 12 mg/dL (ref 6–23)
CO2: 25 mEq/L (ref 19–32)
Calcium: 8.6 mg/dL (ref 8.4–10.5)
Chloride: 100 mEq/L (ref 96–112)
Creatinine, Ser: 1 mg/dL (ref 0.50–1.35)
GFR calc Af Amer: 85 mL/min — ABNORMAL LOW (ref >90)
GFR calc non Af Amer: 73 mL/min — ABNORMAL LOW (ref >90)
Glucose, Bld: 209 mg/dL — ABNORMAL HIGH (ref 70–99)
Potassium: 4.3 mEq/L (ref 3.7–5.3)
Sodium: 138 mEq/L (ref 137–147)

## 2013-10-20 LAB — CBC
HCT: 41.6 % (ref 39.0–52.0)
Hemoglobin: 14.7 g/dL (ref 13.0–17.0)
MCH: 31.4 pg (ref 26.0–34.0)
MCHC: 35.3 g/dL (ref 30.0–36.0)
MCV: 88.9 fL (ref 78.0–100.0)
Platelets: 168 10*3/uL (ref 150–400)
RBC: 4.68 MIL/uL (ref 4.22–5.81)
RDW: 13 % (ref 11.5–15.5)
WBC: 13.1 10*3/uL — ABNORMAL HIGH (ref 4.0–10.5)

## 2013-10-20 NOTE — Progress Notes (Signed)
Patient has refused his meals  And did not want to stay up lone in chair but I asked if he would because MD wanted him up. Walked patient to bathroom . Says he just did not feel good offered pain med frequently and med for nausea was given . He has to be coached to move and eat will continue to offer fluids and diet as ordered along with pain med as needed.

## 2013-10-20 NOTE — Progress Notes (Signed)
Note patient c/o  Being faint when walked to the bathroom  , vss, waited in room while patient in the bathroom back to bed with help no other changes noted the next time he was up did a little better but did not want to sit up long continued from earlier note.

## 2013-10-20 NOTE — Progress Notes (Signed)
1 Day Post-Op  Subjective: Complains of soreness and nausea. Hasn't taken much po yet  Objective: Vital signs in last 24 hours: Temp:  [98.3 F (36.8 C)-99.8 F (37.7 C)] 98.3 F (36.8 C) (02/06 1424) Pulse Rate:  [79-87] 79 (02/06 1424) Resp:  [16-18] 18 (02/06 1424) BP: (111-119)/(66-79) 119/71 mmHg (02/06 1424) SpO2:  [94 %-96 %] 95 % (02/06 1424) Last BM Date: 10/18/13  Intake/Output from previous day: 02/05 0701 - 02/06 0700 In: 3741.3 [P.O.:480; I.V.:3261.3] Out: 1810 [Urine:1800; Blood:10] Intake/Output this shift: Total I/O In: -  Out: 500 [Urine:500]  Resp: clear to auscultation bilaterally Cardio: regular rate and rhythm GI: soft, appropriately tender. incisions ok  Lab Results:   Recent Labs  10/19/13 1610 10/20/13 0338  WBC 13.0* 13.1*  HGB 14.6 14.7  HCT 40.6 41.6  PLT 160 168   BMET  Recent Labs  10/19/13 1610 10/20/13 0338  NA  --  138  K  --  4.3  CL  --  100  CO2  --  25  GLUCOSE  --  209*  BUN  --  12  CREATININE 0.93 1.00  CALCIUM  --  8.6   PT/INR No results found for this basename: LABPROT, INR,  in the last 72 hours ABG No results found for this basename: PHART, PCO2, PO2, HCO3,  in the last 72 hours  Studies/Results: No results found.  Anti-infectives: Anti-infectives   Start     Dose/Rate Route Frequency Ordered Stop   10/19/13 0600  ceFAZolin (ANCEF) IVPB 2 g/50 mL premix     2 g 100 mL/hr over 30 Minutes Intravenous On call to O.R. 10/18/13 1421 10/19/13 0745   10/18/13 1421  ciprofloxacin (CIPRO) IVPB 400 mg  Status:  Discontinued     400 mg 200 mL/hr over 60 Minutes Intravenous 60 min pre-op 10/18/13 1421 10/19/13 1256      Assessment/Plan: s/p Procedure(s): LAPAROSCOPIC VENTRAL HERNIA (N/A) INSERTION OF MESH (N/A) Advance diet once nausea resolves OOB Continue to work on pain control  LOS: 1 day    TOTH III,PAUL S 10/20/2013

## 2013-10-20 NOTE — Progress Notes (Signed)
Inpatient Diabetes Program Recommendations  AACE/ADA: New Consensus Statement on Inpatient Glycemic Control (2013)  Target Ranges:  Prepandial:   less than 140 mg/dL      Peak postprandial:   less than 180 mg/dL (1-2 hours)      Critically ill patients:  140 - 180 mg/dL   Reason for Assessment: Results for Bobby Lopez, Bobby Lopez (MRN [id number]) as of 10/20/2013 12:27  Ref. Range 10/19/2013 06:21 10/19/2013 09:39  Glucose-Capillary Latest Range: 70-99 mg/dL 171 (H) 189 (H)    Diabetes history: Type 2 diabetes Outpatient Diabetes medications: Metformin 500 mg bid Current orders for Inpatient glycemic control: Metformin 500 mg bid.  Please add Novolog moderate tid with meals and HS scale while patient is in hospital.  Adah Perl, RN, BC-ADM Inpatient Diabetes Coordinator Pager [phone]

## 2013-10-21 LAB — BASIC METABOLIC PANEL
BUN: 14 mg/dL (ref 6–23)
CO2: 25 mEq/L (ref 19–32)
Calcium: 8.7 mg/dL (ref 8.4–10.5)
Chloride: 100 mEq/L (ref 96–112)
Creatinine, Ser: 0.91 mg/dL (ref 0.50–1.35)
GFR calc Af Amer: >90 mL/min (ref >90)
GFR calc non Af Amer: 83 mL/min — ABNORMAL LOW (ref >90)
Glucose, Bld: 279 mg/dL — ABNORMAL HIGH (ref 70–99)
Potassium: 3.9 mEq/L (ref 3.7–5.3)
Sodium: 139 mEq/L (ref 137–147)

## 2013-10-21 LAB — CBC
HCT: 42.8 % (ref 39.0–52.0)
Hemoglobin: 15.6 g/dL (ref 13.0–17.0)
MCH: 32.3 pg (ref 26.0–34.0)
MCHC: 36.4 g/dL — ABNORMAL HIGH (ref 30.0–36.0)
MCV: 88.6 fL (ref 78.0–100.0)
Platelets: 185 10*3/uL (ref 150–400)
RBC: 4.83 MIL/uL (ref 4.22–5.81)
RDW: 12.9 % (ref 11.5–15.5)
WBC: 13 10*3/uL — ABNORMAL HIGH (ref 4.0–10.5)

## 2013-10-21 NOTE — Progress Notes (Signed)
2 Days Post-Op  Subjective: Feels much better and wants to go home  Objective: Vital signs in last 24 hours: Temp:  [98 F (36.7 C)-99.2 F (37.3 C)] 98.1 F (36.7 C) (02/07 0505) Pulse Rate:  [79-84] 79 (02/07 0505) Resp:  [18] 18 (02/07 0505) BP: (116-145)/(71-90) 145/90 mmHg (02/07 0505) SpO2:  [93 %-95 %] 95 % (02/07 0505) Last BM Date: 10/18/13  Intake/Output from previous day: 02/06 0701 - 02/07 0700 In: 2280 [P.O.:480; I.V.:1800] Out: 1200 [Urine:1200] Intake/Output this shift:    Resp: clear to auscultation bilaterally Cardio: regular rate and rhythm GI: soft, mild tenderness. incisions look good. good bs  Lab Results:   Recent Labs  10/20/13 0338 10/21/13 0551  WBC 13.1* 13.0*  HGB 14.7 15.6  HCT 41.6 42.8  PLT 168 185   BMET  Recent Labs  10/20/13 0338 10/21/13 0551  NA 138 139  K 4.3 3.9  CL 100 100  CO2 25 25  GLUCOSE 209* 279*  BUN 12 14  CREATININE 1.00 0.91  CALCIUM 8.6 8.7   PT/INR No results found for this basename: LABPROT, INR,  in the last 72 hours ABG No results found for this basename: PHART, PCO2, PO2, HCO3,  in the last 72 hours  Studies/Results: No results found.  Anti-infectives: Anti-infectives   Start     Dose/Rate Route Frequency Ordered Stop   10/19/13 0600  ceFAZolin (ANCEF) IVPB 2 g/50 mL premix     2 g 100 mL/hr over 30 Minutes Intravenous On call to O.R. 10/18/13 1421 10/19/13 0745   10/18/13 1421  ciprofloxacin (CIPRO) IVPB 400 mg  Status:  Discontinued     400 mg 200 mL/hr over 60 Minutes Intravenous 60 min pre-op 10/18/13 1421 10/19/13 1256      Assessment/Plan: s/p Procedure(s): LAPAROSCOPIC VENTRAL HERNIA (N/A) INSERTION OF MESH (N/A) Advance diet Discharge  LOS: 2 days    TOTH III,Zhi Geier S 10/21/2013

## 2013-10-21 NOTE — Discharge Summary (Signed)
Physician Discharge Summary  Patient ID: Bobby Lopez MRN: [id number] DOB/AGE: 1941-01-31 73 y.o.  Admit date: 10/19/2013 Discharge date: 10/21/2013  Admission Diagnoses:  Discharge Diagnoses:  Active Problems:   Ventral hernia   Discharged Condition: good  Hospital Course: the pt underwent lap ventral hernia repair with mesh. Postop he had an ileus which resolved quickly. On pod 2 he was ready for discharge home  Consults: None  Significant Diagnostic Studies: none  Treatments: surgery: as above  Discharge Exam: Blood pressure 145/90, pulse 79, temperature 98.1 F (36.7 C), temperature source Oral, resp. rate 18, height 6' (1.829 m), weight 215 lb 12.8 oz (97.886 kg), SpO2 95.00%. GI: soft, mild tenderness. good bs  Disposition: 01-Home or Self Care  Discharge Orders   Future Appointments Provider Department Dept Phone   11/01/2013 1:30 PM Merrie Roof, MD [facility], [place] [phone]   Future Orders Complete By Expires   Call MD for:  difficulty breathing, headache or visual disturbances  As directed    Call MD for:  extreme fatigue  As directed    Call MD for:  hives  As directed    Call MD for:  persistant dizziness or light-headedness  As directed    Call MD for:  persistant nausea and vomiting  As directed    Call MD for:  redness, tenderness, or signs of infection (pain, swelling, redness, odor or green/yellow discharge around incision site)  As directed    Call MD for:  severe uncontrolled pain  As directed    Call MD for:  temperature >100.4  As directed    Diet - low sodium heart healthy  As directed    Discharge instructions  As directed    Comments:     Do not lift more than 5lbs. May remove dressings and shower on Monday. Wear binder as much as is comfortable. Use colace and miralax daily to avoid constipation   Increase activity slowly  As directed    No wound care  As directed        Medication List         ALIGN PO  Take 1 tablet by  mouth daily.     amLODipine 5 MG tablet  Commonly known as:  NORVASC  Take 5 mg by mouth daily.     atorvastatin 20 MG tablet  Commonly known as:  LIPITOR  Take 20 mg by mouth daily.     FISH OIL PO  Take 1 capsule by mouth daily.     Garlic Tabs  Take 1 each by mouth daily.     losartan-hydrochlorothiazide 50-12.5 MG per tablet  Commonly known as:  HYZAAR  Take 1 tablet by mouth daily.     metFORMIN 500 MG tablet  Commonly known as:  GLUCOPHAGE  TAKE 1 TABLET BY MOUTH 2 TIMES DAILY WITH A MEAL.     multivitamin with minerals Tabs tablet  Take 1 tablet by mouth daily.     OVER THE COUNTER MEDICATION  Take 1 tablet by mouth daily. Prostate health vitamin           Follow-up Information   Follow up with TOTH III,PAUL S, MD In 1 week. (for staple removal)    Specialty:  General Surgery   Contact information:   [address] [place] [address] [phone]       Signed: Merrie Roof 10/21/2013, 7:58 AM

## 2013-10-21 NOTE — Progress Notes (Signed)
DC instructions reviewed with patient, questions answered, verbalized understanding.  Patient given a handwritten script for pain medication.  Patient walked to front of hospital accompanied by spouse to be taken home.  Patient in good condition upon leaving [facility].

## 2013-10-22 ENCOUNTER — Encounter (HOSPITAL_COMMUNITY): Payer: Self-pay | Admitting: Emergency Medicine

## 2013-10-22 ENCOUNTER — Emergency Department (HOSPITAL_COMMUNITY): Payer: Medicare Other

## 2013-10-22 ENCOUNTER — Inpatient Hospital Stay (HOSPITAL_COMMUNITY)
Admission: EM | Admit: 2013-10-22 | Discharge: 2013-10-25 | DRG: 394 | Disposition: A | Discharge status: Home / Self Care | Payer: Medicare Other | Attending: General Surgery | Admitting: General Surgery

## 2013-10-22 DIAGNOSIS — K560 Paralytic ileus: Secondary | ICD-10-CM | POA: Diagnosis present

## 2013-10-22 DIAGNOSIS — Z87891 Personal history of nicotine dependence: Secondary | ICD-10-CM | POA: Diagnosis not present

## 2013-10-22 DIAGNOSIS — I10 Essential (primary) hypertension: Secondary | ICD-10-CM | POA: Diagnosis present

## 2013-10-22 DIAGNOSIS — E785 Hyperlipidemia, unspecified: Secondary | ICD-10-CM | POA: Diagnosis present

## 2013-10-22 DIAGNOSIS — Y838 Other surgical procedures as the cause of abnormal reaction of the patient, or of later complication, without mention of misadventure at the time of the procedure: Secondary | ICD-10-CM | POA: Diagnosis present

## 2013-10-22 DIAGNOSIS — Z79899 Other long term (current) drug therapy: Secondary | ICD-10-CM | POA: Diagnosis not present

## 2013-10-22 DIAGNOSIS — K56609 Unspecified intestinal obstruction, unspecified as to partial versus complete obstruction: Secondary | ICD-10-CM

## 2013-10-22 DIAGNOSIS — K567 Ileus, unspecified: Secondary | ICD-10-CM | POA: Diagnosis present

## 2013-10-22 DIAGNOSIS — K929 Disease of digestive system, unspecified: Principal | ICD-10-CM | POA: Diagnosis present

## 2013-10-22 DIAGNOSIS — E119 Type 2 diabetes mellitus without complications: Secondary | ICD-10-CM | POA: Diagnosis present

## 2013-10-22 DIAGNOSIS — Z86718 Personal history of other venous thrombosis and embolism: Secondary | ICD-10-CM

## 2013-10-22 DIAGNOSIS — R1110 Vomiting, unspecified: Secondary | ICD-10-CM | POA: Diagnosis not present

## 2013-10-22 DIAGNOSIS — K9189 Other postprocedural complications and disorders of digestive system: Secondary | ICD-10-CM

## 2013-10-22 DIAGNOSIS — J9819 Other pulmonary collapse: Secondary | ICD-10-CM | POA: Diagnosis not present

## 2013-10-22 LAB — GLUCOSE, CAPILLARY
Glucose-Capillary: 237 mg/dL — ABNORMAL HIGH (ref 70–99)
Glucose-Capillary: 180 mg/dL — ABNORMAL HIGH (ref 70–99)
Glucose-Capillary: 157 mg/dL — ABNORMAL HIGH (ref 70–99)

## 2013-10-22 LAB — CREATININE, SERUM
Creatinine, Ser: 1.08 mg/dL (ref 0.50–1.35)
GFR calc Af Amer: 77 mL/min — ABNORMAL LOW (ref >90)
GFR calc non Af Amer: 67 mL/min — ABNORMAL LOW (ref >90)

## 2013-10-22 LAB — COMPREHENSIVE METABOLIC PANEL
ALT: 14 U/L (ref 0–53)
AST: 14 U/L (ref 0–37)
Albumin: 3.6 g/dL (ref 3.5–5.2)
Alkaline Phosphatase: 64 U/L (ref 39–117)
BUN: 29 mg/dL — ABNORMAL HIGH (ref 6–23)
CO2: 28 mEq/L (ref 19–32)
Calcium: 9.5 mg/dL (ref 8.4–10.5)
Chloride: 95 mEq/L — ABNORMAL LOW (ref 96–112)
Creatinine, Ser: 1.15 mg/dL (ref 0.50–1.35)
GFR calc Af Amer: 72 mL/min — ABNORMAL LOW (ref >90)
GFR calc non Af Amer: 62 mL/min — ABNORMAL LOW (ref >90)
Glucose, Bld: 313 mg/dL — ABNORMAL HIGH (ref 70–99)
Potassium: 4 mEq/L (ref 3.7–5.3)
Sodium: 142 mEq/L (ref 137–147)
Total Bilirubin: 1.2 mg/dL (ref 0.3–1.2)
Total Protein: 8.1 g/dL (ref 6.0–8.3)

## 2013-10-22 LAB — CBC
HCT: 45.5 % (ref 39.0–52.0)
Hemoglobin: 16 g/dL (ref 13.0–17.0)
MCH: 31.4 pg (ref 26.0–34.0)
MCHC: 35.2 g/dL (ref 30.0–36.0)
MCV: 89.2 fL (ref 78.0–100.0)
Platelets: 215 10*3/uL (ref 150–400)
RBC: 5.1 MIL/uL (ref 4.22–5.81)
RDW: 12.7 % (ref 11.5–15.5)
WBC: 15.4 10*3/uL — ABNORMAL HIGH (ref 4.0–10.5)

## 2013-10-22 LAB — CBC WITH DIFFERENTIAL/PLATELET
Basophils Absolute: 0 10*3/uL (ref 0.0–0.1)
Basophils Relative: 0 % (ref 0–1)
Eosinophils Absolute: 0 10*3/uL (ref 0.0–0.7)
Eosinophils Relative: 0 % (ref 0–5)
HCT: 46 % (ref 39.0–52.0)
Hemoglobin: 16.7 g/dL (ref 13.0–17.0)
Lymphocytes Relative: 7 % — ABNORMAL LOW (ref 12–46)
Lymphs Abs: 1.1 10*3/uL (ref 0.7–4.0)
MCH: 31.9 pg (ref 26.0–34.0)
MCHC: 36.3 g/dL — ABNORMAL HIGH (ref 30.0–36.0)
MCV: 88 fL (ref 78.0–100.0)
Monocytes Absolute: 1.6 10*3/uL — ABNORMAL HIGH (ref 0.1–1.0)
Monocytes Relative: 10 % (ref 3–12)
Neutro Abs: 14.1 10*3/uL — ABNORMAL HIGH (ref 1.7–7.7)
Neutrophils Relative %: 84 % — ABNORMAL HIGH (ref 43–77)
Platelets: 213 10*3/uL (ref 150–400)
RBC: 5.23 MIL/uL (ref 4.22–5.81)
RDW: 12.8 % (ref 11.5–15.5)
WBC: 16.8 10*3/uL — ABNORMAL HIGH (ref 4.0–10.5)

## 2013-10-22 LAB — LIPASE, BLOOD: Lipase: 11 U/L (ref 11–59)

## 2013-10-22 MED ORDER — ONDANSETRON HCL 4 MG/2ML IJ SOLN
4.0000 mg | Freq: Once | INTRAMUSCULAR | Status: AC
  Administered 2013-10-22: 4 mg via INTRAVENOUS
  Filled 2013-10-22: qty 2

## 2013-10-22 MED ORDER — AMLODIPINE BESYLATE 5 MG PO TABS
5.0000 mg | ORAL_TABLET | Freq: Every day | ORAL | Status: DC
  Administered 2013-10-22 – 2013-10-25 (×4): 5 mg via ORAL
  Filled 2013-10-22 (×5): qty 1

## 2013-10-22 MED ORDER — MORPHINE SULFATE 4 MG/ML IJ SOLN
4.0000 mg | Freq: Once | INTRAMUSCULAR | Status: AC
  Administered 2013-10-22: 4 mg via INTRAVENOUS
  Filled 2013-10-22: qty 1

## 2013-10-22 MED ORDER — ATORVASTATIN CALCIUM 20 MG PO TABS
20.0000 mg | ORAL_TABLET | Freq: Every day | ORAL | Status: DC
  Administered 2013-10-22 – 2013-10-25 (×4): 20 mg via ORAL
  Filled 2013-10-22 (×5): qty 1

## 2013-10-22 MED ORDER — POTASSIUM CHLORIDE IN NACL 20-0.9 MEQ/L-% IV SOLN
INTRAVENOUS | Status: DC
  Administered 2013-10-22 – 2013-10-25 (×9): via INTRAVENOUS
  Filled 2013-10-22 (×10): qty 1000

## 2013-10-22 MED ORDER — PANTOPRAZOLE SODIUM 40 MG IV SOLR
40.0000 mg | Freq: Every day | INTRAVENOUS | Status: DC
  Administered 2013-10-22 – 2013-10-24 (×3): 40 mg via INTRAVENOUS
  Filled 2013-10-22 (×5): qty 40

## 2013-10-22 MED ORDER — INSULIN ASPART 100 UNIT/ML ~~LOC~~ SOLN
0.0000 [IU] | SUBCUTANEOUS | Status: DC
  Administered 2013-10-22: 3 [IU] via SUBCUTANEOUS
  Administered 2013-10-22: 5 [IU] via SUBCUTANEOUS
  Administered 2013-10-22 – 2013-10-23 (×2): 3 [IU] via SUBCUTANEOUS
  Administered 2013-10-23: 2 [IU] via SUBCUTANEOUS
  Administered 2013-10-23: 3 [IU] via SUBCUTANEOUS
  Administered 2013-10-23: 2 [IU] via SUBCUTANEOUS
  Administered 2013-10-23: 3 [IU] via SUBCUTANEOUS
  Administered 2013-10-23 – 2013-10-24 (×2): 2 [IU] via SUBCUTANEOUS
  Administered 2013-10-24: 3 [IU] via SUBCUTANEOUS
  Administered 2013-10-24: 2 [IU] via SUBCUTANEOUS
  Administered 2013-10-24 – 2013-10-25 (×3): 3 [IU] via SUBCUTANEOUS
  Administered 2013-10-25 (×2): 2 [IU] via SUBCUTANEOUS

## 2013-10-22 MED ORDER — HYDROCHLOROTHIAZIDE 12.5 MG PO CAPS
12.5000 mg | ORAL_CAPSULE | Freq: Every day | ORAL | Status: DC
  Administered 2013-10-22 – 2013-10-25 (×4): 12.5 mg via ORAL
  Filled 2013-10-22 (×4): qty 1

## 2013-10-22 MED ORDER — SODIUM CHLORIDE 0.9 % IV BOLUS (SEPSIS)
500.0000 mL | Freq: Once | INTRAVENOUS | Status: AC
  Administered 2013-10-22: 500 mL via INTRAVENOUS

## 2013-10-22 MED ORDER — PHENOL 1.4 % MT LIQD
1.0000 | OROMUCOSAL | Status: DC | PRN
  Administered 2013-10-22: 1 via OROMUCOSAL
  Filled 2013-10-22: qty 177

## 2013-10-22 MED ORDER — LOSARTAN POTASSIUM-HCTZ 50-12.5 MG PO TABS: 1.0000 | ORAL_TABLET | Freq: Every day | ORAL | Status: DC

## 2013-10-22 MED ORDER — ENOXAPARIN SODIUM 40 MG/0.4ML ~~LOC~~ SOLN
40.0000 mg | Freq: Every day | SUBCUTANEOUS | Status: DC
  Administered 2013-10-22 – 2013-10-25 (×4): 40 mg via SUBCUTANEOUS
  Filled 2013-10-22 (×4): qty 0.4

## 2013-10-22 MED ORDER — LOSARTAN POTASSIUM 50 MG PO TABS
50.0000 mg | ORAL_TABLET | Freq: Every day | ORAL | Status: DC
  Administered 2013-10-22 – 2013-10-25 (×4): 50 mg via ORAL
  Filled 2013-10-22 (×5): qty 1

## 2013-10-22 MED ORDER — METOCLOPRAMIDE HCL 5 MG/ML IJ SOLN
10.0000 mg | Freq: Four times a day (QID) | INTRAMUSCULAR | Status: DC
  Administered 2013-10-22 – 2013-10-25 (×12): 10 mg via INTRAVENOUS
  Filled 2013-10-22 (×19): qty 2

## 2013-10-22 MED ORDER — BISACODYL 10 MG RE SUPP
10.0000 mg | Freq: Once | RECTAL | Status: AC
  Administered 2013-10-22: 10 mg via RECTAL
  Filled 2013-10-22: qty 1

## 2013-10-22 MED ORDER — MORPHINE SULFATE 2 MG/ML IJ SOLN: 2.0000 mg | INTRAMUSCULAR | Status: DC | PRN

## 2013-10-22 MED ORDER — ONDANSETRON HCL 4 MG/2ML IJ SOLN: 4.0000 mg | Freq: Four times a day (QID) | INTRAMUSCULAR | Status: DC | PRN

## 2013-10-22 NOTE — ED Notes (Signed)
The pt is c/o abd pain and vomiting for 3 days and not eating or sleeping.  He had a ventral hernia repaired on thursday

## 2013-10-22 NOTE — ED Provider Notes (Signed)
CSN: [id number]     Arrival date & time 10/22/13  8756 History   First MD Initiated Contact with Patient 10/22/13 [phone]     Chief Complaint  Patient presents with  . Abdominal Pain   (Consider location/radiation/quality/duration/timing/severity/associated sxs/prior Treatment) HPI Comments: PT presents with n/v.  He is s/p ventral hernia repair with mesh on 2/5, had ileus which resolved, was discharged yesterday.  Surgery was done by Dr. Marlou Starks.  He states that last night, he began having n/v which was bilious, nonbloody.  Has had ongoing vomiting since that time.  No abd pain.  Has not had a BM since surgery.  Was passing a small amount of gas this am.  No urinary problems.  No fevers/chills.  Patient is a 73 y.o. male presenting with abdominal pain.  Abdominal Pain Associated symptoms: nausea and vomiting   Associated symptoms: no chest pain, no chills, no cough, no diarrhea, no fatigue, no fever, no hematuria and no shortness of breath     Past Medical History  Diagnosis Date  . Hypertension   . Hyperlipidemia   . Ventral hernia     followed by Dr. Cyril Loosen  . Arthritis     Back  . DVT (deep venous thrombosis)   . Eczema   . Clotting disorder   . Bladder stones     NEEDS TO HAVE SURGERY  . Diabetes mellitus     type  II   Past Surgical History  Procedure Laterality Date  . Cholecystectomy    . Colonoscopy  01-27-06    per Dr. Sharlett Iles, repeat in 10 yrs  . Lumbar laminectomy/decompression microdiscectomy  09/26/2012    Procedure: LUMBAR LAMINECTOMY/DECOMPRESSION MICRODISCECTOMY 1 LEVEL;  Surgeon: Elaina Hoops, MD;  Location: [facility];  Service: Neurosurgery;  Laterality: Left;  Lumbar Lamiectomy Extraforaminal Diskectomy Lumbar Three-Four Left  . Ventral hernia repair  10/19/2013    DR TOTH   Family History  Problem Relation Age of Onset  . Diabetes Mother   . Hypertension Mother    History  Substance Use Topics  . Smoking status: Former Research scientist (life sciences)  . Smokeless tobacco:  Never Used     Comment: quit > 46 years ago  . Alcohol Use: No    Review of Systems  Constitutional: Negative for fever, chills, diaphoresis and fatigue.  HENT: Negative for congestion, rhinorrhea and sneezing.   Eyes: Negative.   Respiratory: Negative for cough, chest tightness and shortness of breath.   Cardiovascular: Negative for chest pain and leg swelling.  Gastrointestinal: Positive for nausea and vomiting. Negative for diarrhea and blood in stool.  Genitourinary: Negative for frequency, hematuria, flank pain and difficulty urinating.  Musculoskeletal: Negative for arthralgias and back pain.  Skin: Negative for rash.  Neurological: Negative for dizziness, speech difficulty, weakness, numbness and headaches.    Allergies  Ace inhibitors and Hydrocodone  Home Medications   Current Outpatient Rx  Name  Route  Sig  Dispense  Refill  . amLODipine (NORVASC) 5 MG tablet   Oral   Take 5 mg by mouth daily.         Marland Kitchen atorvastatin (LIPITOR) 20 MG tablet   Oral   Take 20 mg by mouth daily.         Marland Kitchen losartan-hydrochlorothiazide (HYZAAR) 50-12.5 MG per tablet   Oral   Take 1 tablet by mouth daily.         Marland Kitchen oxyCODONE-acetaminophen (PERCOCET/ROXICET) 5-325 MG per tablet               .  Garlic TABS   Oral   Take 1 each by mouth daily.          . metFORMIN (GLUCOPHAGE) 500 MG tablet      TAKE 1 TABLET BY MOUTH 2 TIMES DAILY WITH A MEAL.   60 tablet   4     CYCLE FILL MEDICATION. Authorization is required f ...   . Multiple Vitamin (MULTIVITAMIN WITH MINERALS) TABS   Oral   Take 1 tablet by mouth daily.         . Omega-3 Fatty Acids (FISH OIL PO)   Oral   Take 1 capsule by mouth daily.         Marland Kitchen OVER THE COUNTER MEDICATION   Oral   Take 1 tablet by mouth daily. Prostate health vitamin         . Probiotic Product (ALIGN PO)   Oral   Take 1 tablet by mouth daily.           BP 120/63  Pulse 86  Temp(Src) 97.5 F (36.4 C) (Oral)  Resp 22   Ht 5\' 11"  (1.803 m)  Wt 211 lb 5 oz (95.851 kg)  BMI 29.49 kg/m2  SpO2 92% Physical Exam  Constitutional: He is oriented to person, place, and time. He appears well-developed and well-nourished.  HENT:  Head: Normocephalic and atraumatic.  Eyes: Pupils are equal, round, and reactive to light.  Neck: Normal range of motion. Neck supple.  Cardiovascular: Normal rate, regular rhythm and normal heart sounds.   Pulmonary/Chest: Effort normal and breath sounds normal. No respiratory distress. He has no wheezes. He has no rales. He exhibits no tenderness.  Abdominal: Soft. Bowel sounds are normal. He exhibits distension (some tympany to percussion). There is no tenderness. There is no rebound and no guarding.  Musculoskeletal: Normal range of motion. He exhibits no edema.  Lymphadenopathy:    He has no cervical adenopathy.  Neurological: He is alert and oriented to person, place, and time.  Skin: Skin is warm and dry. No rash noted.  Psychiatric: He has a normal mood and affect.    ED Course  Procedures (including critical care time) Labs Review Labs Reviewed  CBC WITH DIFFERENTIAL - Abnormal; Notable for the following:    WBC 16.8 (*)    MCHC 36.3 (*)    Neutrophils Relative % 84 (*)    Neutro Abs 14.1 (*)    Lymphocytes Relative 7 (*)    Monocytes Absolute 1.6 (*)    All other components within normal limits  COMPREHENSIVE METABOLIC PANEL - Abnormal; Notable for the following:    Chloride 95 (*)    Glucose, Bld 313 (*)    BUN 29 (*)    GFR calc non Af Amer 62 (*)    GFR calc Af Amer 72 (*)    All other components within normal limits  LIPASE, BLOOD   Imaging Review Dg Abd Acute W/chest  10/22/2013   CLINICAL DATA:  Abdominal pain, recent surgery  EXAM: ACUTE ABDOMEN SERIES (ABDOMEN 2 VIEW & CHEST 1 VIEW)  COMPARISON:  10/09/2013  FINDINGS: Mild bibasilar atelectatic changes are seen. The cardiac shadow is within normal limits. Free air is noted beneath the right hemidiaphragm  consistent with the recent surgical history.  Multiple dilated loops of small bowel with air-fluid levels are noted consistent with an obstructive process. From colonic gas is seen in these changes may represent a partial small bowel obstruction. Correlation with the physical exam is rib recommended. Alternatively  this could represent a postoperative ileus. No bony abnormality is seen.  IMPRESSION: Diffuse small bowel dilatation likely representing a partial small bowel obstruction or postoperative ileus. Correlation with the physical exam is recommended.   Electronically Signed   By: Inez Catalina M.D.   On: 10/22/2013 08:01    EKG Interpretation   None       MDM   1. Small bowel obstruction    Pt given zofran, IVFs, still with vomiting.  Discussed with Dr. Georgette Dover who will see the pt for admission.    Malvin Johns, MD 10/22/13 [phone]

## 2013-10-22 NOTE — ED Notes (Signed)
Patient transported to X-ray 

## 2013-10-22 NOTE — H&P (Signed)
Bobby Lopez is an 73 y.o. male.   Chief Complaint: Abdominal distention/ nausea/ vomiting HPI: 73 yo male is 3 days s/p laparoscopic lysis of adhesions and ventral hernia repair with mesh by Dr. Autumn Messing.  He was discharged yesterday on POD #2.  He was tolerating a diet, but had not had a bowel movement yet.  After going home, he became more distended and began having hiccups.  He has had several episodes of nausea and vomiting. He reports some flatus this morning, but remains quite distended.   Past Medical History  Diagnosis Date  . Hypertension   . Hyperlipidemia   . Ventral hernia     followed by Dr. Cyril Loosen  . Arthritis     Back  . DVT (deep venous thrombosis)   . Eczema   . Clotting disorder   . Bladder stones     NEEDS TO HAVE SURGERY  . Diabetes mellitus     type  II    Past Surgical History  Procedure Laterality Date  . Cholecystectomy    . Colonoscopy  01-27-06    per Dr. Sharlett Iles, repeat in 10 yrs  . Lumbar laminectomy/decompression microdiscectomy  09/26/2012    Procedure: LUMBAR LAMINECTOMY/DECOMPRESSION MICRODISCECTOMY 1 LEVEL;  Surgeon: Elaina Hoops, MD;  Location: [place] [facility];  Service: Neurosurgery;  Laterality: Left;  Lumbar Lamiectomy Extraforaminal Diskectomy Lumbar Three-Four Left  . Ventral hernia repair  10/19/2013    DR TOTH    Family History  Problem Relation Age of Onset  . Diabetes Mother   . Hypertension Mother    Social History:  reports that he has quit smoking. He has never used smokeless tobacco. He reports that he does not drink alcohol or use illicit drugs.  Allergies:  Allergies  Allergen Reactions  . Ace Inhibitors Cough    cough  . Hydrocodone Nausea And Vomiting    Prior to Admission medications   Medication Sig Start Date End Date Taking? Authorizing Provider  amLODipine (NORVASC) 5 MG tablet Take 5 mg by mouth daily.   Yes Historical Provider, MD  atorvastatin (LIPITOR) 20 MG tablet Take 20 mg by mouth daily.   Yes  Historical Provider, MD  losartan-hydrochlorothiazide (HYZAAR) 50-12.5 MG per tablet Take 1 tablet by mouth daily.   Yes Historical Provider, MD  oxyCODONE-acetaminophen (PERCOCET/ROXICET) 5-325 MG per tablet  10/21/13  Yes Historical Provider, MD  Garlic TABS Take 1 each by mouth daily.     Historical Provider, MD  metFORMIN (GLUCOPHAGE) 500 MG tablet TAKE 1 TABLET BY MOUTH 2 TIMES DAILY WITH A MEAL. 10/13/13   Laurey Morale, MD  Multiple Vitamin (MULTIVITAMIN WITH MINERALS) TABS Take 1 tablet by mouth daily.    Historical Provider, MD  Omega-3 Fatty Acids (FISH OIL PO) Take 1 capsule by mouth daily.    Historical Provider, MD  OVER THE COUNTER MEDICATION Take 1 tablet by mouth daily. Prostate health vitamin    Historical Provider, MD  Probiotic Product (ALIGN PO) Take 1 tablet by mouth daily.     Historical Provider, MD    Results for orders placed during the hospital encounter of 10/22/13 (from the past 48 hour(s))  CBC WITH DIFFERENTIAL     Status: Abnormal   Collection Time    10/22/13  7:18 AM      Result Value Range   WBC 16.8 (*) 4.0 - 10.5 K/uL   RBC 5.23  4.22 - 5.81 MIL/uL   Hemoglobin 16.7  13.0 - 17.0 g/dL  HCT 46.0  39.0 - 52.0 %   MCV 88.0  78.0 - 100.0 fL   MCH 31.9  26.0 - 34.0 pg   MCHC 36.3 (*) 30.0 - 36.0 g/dL   RDW 12.8  11.5 - 15.5 %   Platelets 213  150 - 400 K/uL   Neutrophils Relative % 84 (*) 43 - 77 %   Neutro Abs 14.1 (*) 1.7 - 7.7 K/uL   Lymphocytes Relative 7 (*) 12 - 46 %   Lymphs Abs 1.1  0.7 - 4.0 K/uL   Monocytes Relative 10  3 - 12 %   Monocytes Absolute 1.6 (*) 0.1 - 1.0 K/uL   Eosinophils Relative 0  0 - 5 %   Eosinophils Absolute 0.0  0.0 - 0.7 K/uL   Basophils Relative 0  0 - 1 %   Basophils Absolute 0.0  0.0 - 0.1 K/uL  COMPREHENSIVE METABOLIC PANEL     Status: Abnormal   Collection Time    10/22/13  7:18 AM      Result Value Range   Sodium 142  137 - 147 mEq/L   Potassium 4.0  3.7 - 5.3 mEq/L   Chloride 95 (*) 96 - 112 mEq/L   CO2 28   19 - 32 mEq/L   Glucose, Bld 313 (*) 70 - 99 mg/dL   BUN 29 (*) 6 - 23 mg/dL   Creatinine, Ser 1.15  0.50 - 1.35 mg/dL   Calcium 9.5  8.4 - 10.5 mg/dL   Total Protein 8.1  6.0 - 8.3 g/dL   Albumin 3.6  3.5 - 5.2 g/dL   AST 14  0 - 37 U/L   ALT 14  0 - 53 U/L   Alkaline Phosphatase 64  39 - 117 U/L   Total Bilirubin 1.2  0.3 - 1.2 mg/dL   GFR calc non Af Amer 62 (*) >90 mL/min   GFR calc Af Amer 72 (*) >90 mL/min   Comment: (NOTE)     The eGFR has been calculated using the CKD EPI equation.     This calculation has not been validated in all clinical situations.     eGFR's persistently <90 mL/min signify possible Chronic Kidney     Disease.  LIPASE, BLOOD     Status: None   Collection Time    10/22/13  7:18 AM      Result Value Range   Lipase 11  11 - 59 U/L   Dg Abd Acute W/chest  10/22/2013   CLINICAL DATA:  Abdominal pain, recent surgery  EXAM: ACUTE ABDOMEN SERIES (ABDOMEN 2 VIEW & CHEST 1 VIEW)  COMPARISON:  10/09/2013  FINDINGS: Mild bibasilar atelectatic changes are seen. The cardiac shadow is within normal limits. Free air is noted beneath the right hemidiaphragm consistent with the recent surgical history.  Multiple dilated loops of small bowel with air-fluid levels are noted consistent with an obstructive process. From colonic gas is seen in these changes may represent a partial small bowel obstruction. Correlation with the physical exam is rib recommended. Alternatively this could represent a postoperative ileus. No bony abnormality is seen.  IMPRESSION: Diffuse small bowel dilatation likely representing a partial small bowel obstruction or postoperative ileus. Correlation with the physical exam is recommended.   Electronically Signed   By: Inez Catalina M.D.   On: 10/22/2013 08:01    Review of Systems  Constitutional: Negative for weight loss.  HENT: Negative for ear discharge, ear pain, hearing loss and tinnitus.  Eyes: Negative for blurred vision, double vision, photophobia  and pain.  Respiratory: Negative for cough, sputum production and shortness of breath.   Cardiovascular: Negative for chest pain.  Gastrointestinal: Positive for nausea, vomiting and abdominal pain.  Genitourinary: Negative for dysuria, urgency, frequency and flank pain.  Musculoskeletal: Negative for back pain, falls, joint pain, myalgias and neck pain.  Neurological: Negative for dizziness, tingling, sensory change, focal weakness, loss of consciousness and headaches.  Endo/Heme/Allergies: Does not bruise/bleed easily.  Psychiatric/Behavioral: Negative for depression, memory loss and substance abuse. The patient is not nervous/anxious.     Blood pressure 120/63, pulse 86, temperature 97.5 F (36.4 C), temperature source Oral, resp. rate 22, height _0  (1.803 m), weight 211 lb 5 oz (95.851 kg), SpO2 92.00%. Physical Exam  WDWN in NAD HEENT:  EOMI, sclera anicteric Neck:  No masses, no thyromegaly Lungs:  CTA bilaterally; normal respiratory effort CV:  Regular rate and rhythm; no murmurs Abd:  Very distended; hypoactive bowel sounds;  Incisions c/d/i  Ext:  Well-perfused; no edema Skin:  Warm, dry; no sign of jaundice  Assessment/Plan Post-operative ileus  Place NG tube - should result in some relief. Reglan Dulcolax suppository   Paola Flynt K. 10/22/2013, 8:54 AM

## 2013-10-23 ENCOUNTER — Encounter (HOSPITAL_COMMUNITY): Payer: Self-pay | Admitting: General Surgery

## 2013-10-23 LAB — CBC
HCT: 42.8 % (ref 39.0–52.0)
Hemoglobin: 15.1 g/dL (ref 13.0–17.0)
MCH: 31.8 pg (ref 26.0–34.0)
MCHC: 35.3 g/dL (ref 30.0–36.0)
MCV: 90.1 fL (ref 78.0–100.0)
Platelets: 275 10*3/uL (ref 150–400)
RBC: 4.75 MIL/uL (ref 4.22–5.81)
RDW: 13.1 % (ref 11.5–15.5)
WBC: 14 10*3/uL — ABNORMAL HIGH (ref 4.0–10.5)

## 2013-10-23 LAB — URINE MICROSCOPIC-ADD ON

## 2013-10-23 LAB — BASIC METABOLIC PANEL
BUN: 27 mg/dL — ABNORMAL HIGH (ref 6–23)
CO2: 29 mEq/L (ref 19–32)
Calcium: 9 mg/dL (ref 8.4–10.5)
Chloride: 103 mEq/L (ref 96–112)
Creatinine, Ser: 1.16 mg/dL (ref 0.50–1.35)
GFR calc Af Amer: 71 mL/min — ABNORMAL LOW (ref >90)
GFR calc non Af Amer: 61 mL/min — ABNORMAL LOW (ref >90)
Glucose, Bld: 169 mg/dL — ABNORMAL HIGH (ref 70–99)
Potassium: 4.4 mEq/L (ref 3.7–5.3)
Sodium: 144 mEq/L (ref 137–147)

## 2013-10-23 LAB — GLUCOSE, CAPILLARY
Glucose-Capillary: 149 mg/dL — ABNORMAL HIGH (ref 70–99)
Glucose-Capillary: 149 mg/dL — ABNORMAL HIGH (ref 70–99)
Glucose-Capillary: 149 mg/dL — ABNORMAL HIGH (ref 70–99)
Glucose-Capillary: 152 mg/dL — ABNORMAL HIGH (ref 70–99)
Glucose-Capillary: 152 mg/dL — ABNORMAL HIGH (ref 70–99)
Glucose-Capillary: 175 mg/dL — ABNORMAL HIGH (ref 70–99)

## 2013-10-23 LAB — URINALYSIS, ROUTINE W REFLEX MICROSCOPIC
Bilirubin Urine: NEGATIVE
Glucose, UA: NEGATIVE mg/dL
Ketones, ur: 15 mg/dL — AB
Leukocytes, UA: NEGATIVE
Nitrite: NEGATIVE
Protein, ur: NEGATIVE mg/dL
Specific Gravity, Urine: 1.023 (ref 1.005–1.030)
Urobilinogen, UA: 0.2 mg/dL (ref 0.0–1.0)
pH: 6 (ref 5.0–8.0)

## 2013-10-23 NOTE — Progress Notes (Signed)
  Subjective: He says he is feeling better. He states he has passed flatus and had a bm  Objective: Vital signs in last 24 hours: Temp:  [97.9 F (36.6 C)-98.5 F (36.9 C)] 98.3 F (36.8 C) (02/09 0541) Pulse Rate:  [73-105] 87 (02/09 0541) Resp:  [15-17] 17 (02/09 0541) BP: (111-140)/(75-83) 126/75 mmHg (02/09 0541) SpO2:  [92 %-95 %] 93 % (02/09 0541) Last BM Date: 10/22/13  Intake/Output from previous day: 02/08 0701 - 02/09 0700 In: 100 [NG/GT:100] Out: 3172 [Urine:221; Emesis/NG output:2950; Stool:1] Intake/Output this shift: Total I/O In: 240 [P.O.:240] Out: -   Resp: clear to auscultation bilaterally Cardio: regular rate and rhythm GI: soft, nontender. good bs. incisions look good  Lab Results:   Recent Labs  10/22/13 1310 10/23/13 0434  WBC 15.4* 14.0*  HGB 16.0 15.1  HCT 45.5 42.8  PLT 215 275   BMET  Recent Labs  10/22/13 0718 10/22/13 1310 10/23/13 0434  NA 142  --  144  K 4.0  --  4.4  CL 95*  --  103  CO2 28  --  29  GLUCOSE 313*  --  169*  BUN 29*  --  27*  CREATININE 1.15 1.08 1.16  CALCIUM 9.5  --  9.0   PT/INR No results found for this basename: LABPROT, INR,  in the last 72 hours ABG No results found for this basename: PHART, PCO2, PO2, HCO3,  in the last 72 hours  Studies/Results: Dg Abd Acute W/chest  10/22/2013   CLINICAL DATA:  Abdominal pain, recent surgery  EXAM: ACUTE ABDOMEN SERIES (ABDOMEN 2 VIEW & CHEST 1 VIEW)  COMPARISON:  10/09/2013  FINDINGS: Mild bibasilar atelectatic changes are seen. The cardiac shadow is within normal limits. Free air is noted beneath the right hemidiaphragm consistent with the recent surgical history.  Multiple dilated loops of small bowel with air-fluid levels are noted consistent with an obstructive process. From colonic gas is seen in these changes may represent a partial small bowel obstruction. Correlation with the physical exam is rib recommended. Alternatively this could represent a  postoperative ileus. No bony abnormality is seen.  IMPRESSION: Diffuse small bowel dilatation likely representing a partial small bowel obstruction or postoperative ileus. Correlation with the physical exam is recommended.   Electronically Signed   By: Inez Catalina M.D.   On: 10/22/2013 08:01    Anti-infectives: Anti-infectives   None      Assessment/Plan: s/p * No surgery found * Will try clamping ng today. Continue reglan for ileus ambulate  LOS: 1 day    TOTH III,PAUL S 10/23/2013

## 2013-10-24 ENCOUNTER — Inpatient Hospital Stay (HOSPITAL_COMMUNITY): Payer: Medicare Other

## 2013-10-24 DIAGNOSIS — K560 Paralytic ileus: Secondary | ICD-10-CM | POA: Diagnosis not present

## 2013-10-24 LAB — GLUCOSE, CAPILLARY
Glucose-Capillary: 138 mg/dL — ABNORMAL HIGH (ref 70–99)
Glucose-Capillary: 138 mg/dL — ABNORMAL HIGH (ref 70–99)
Glucose-Capillary: 139 mg/dL — ABNORMAL HIGH (ref 70–99)
Glucose-Capillary: 152 mg/dL — ABNORMAL HIGH (ref 70–99)
Glucose-Capillary: 168 mg/dL — ABNORMAL HIGH (ref 70–99)
Glucose-Capillary: 171 mg/dL — ABNORMAL HIGH (ref 70–99)

## 2013-10-24 MED ORDER — BOOST / RESOURCE BREEZE PO LIQD
1.0000 | ORAL | Status: DC
  Administered 2013-10-24: 1 via ORAL

## 2013-10-24 MED ORDER — ADULT MULTIVITAMIN LIQUID CH
5.0000 mL | Freq: Every day | ORAL | Status: DC
  Administered 2013-10-24: 5 mL via ORAL
  Filled 2013-10-24 (×2): qty 5

## 2013-10-24 MED ORDER — PRO-STAT SUGAR FREE PO LIQD
30.0000 mL | ORAL | Status: DC
  Administered 2013-10-24: 30 mL via ORAL
  Filled 2013-10-24 (×2): qty 30

## 2013-10-24 NOTE — Progress Notes (Signed)
  Subjective: No complaints. Feels better. Had 2 bm's this am  Objective: Vital signs in last 24 hours: Temp:  [97.7 F (36.5 C)-98.3 F (36.8 C)] 98.2 F (36.8 C) 11-14-22 0440) Pulse Rate:  [77-82] 79 November 14, 2022 0440) Resp:  [16-20] 20 Nov 14, 2022 0440) BP: (126-143)/(70-80) 143/80 mmHg 11/14/2022 0440) SpO2:  [94 %-96 %] 95 % 11/14/2022 0440) Last BM Date: 11-14-2013  Intake/Output from previous day: 02/09 0701 - 11-14-22 0700 In: 940 [P.O.:240; I.V.:700] Out: 325 [Urine:325] Intake/Output this shift:    Resp: clear to auscultation bilaterally Cardio: regular rate and rhythm GI: soft, nontender. quiet. incisions ok  Lab Results:   Recent Labs  10/22/13 1310 10/23/13 0434  WBC 15.4* 14.0*  HGB 16.0 15.1  HCT 45.5 42.8  PLT 215 275   BMET  Recent Labs  10/22/13 0718 10/22/13 1310 10/23/13 0434  NA 142  --  144  K 4.0  --  4.4  CL 95*  --  103  CO2 28  --  29  GLUCOSE 313*  --  169*  BUN 29*  --  27*  CREATININE 1.15 1.08 1.16  CALCIUM 9.5  --  9.0   PT/INR No results found for this basename: LABPROT, INR,  in the last 72 hours ABG No results found for this basename: PHART, PCO2, PO2, HCO3,  in the last 72 hours  Studies/Results: Dg Abd 2 Views  14-Nov-2013   CLINICAL DATA:  Ileus.  EXAM: ABDOMEN - 2 VIEW  COMPARISON:  10/22/2013  FINDINGS: NG tube is in place with the tip in the distal stomach. There has been decrease in the number of distended small bowel loops. There is decreased air in the nondistended colon. No free air on the upright radiograph.  IMPRESSION: Improving ileus after ventral hernia repair.   Electronically Signed   By: Rozetta Nunnery M.D.   On: 11/14/13 07:51    Anti-infectives: Anti-infectives   None      Assessment/Plan: s/p * No surgery found * Advance diet. Start clears today. Leave ng clamped another day ambulate  LOS: 2 days    TOTH III,Zani Kyllonen S 11/14/2013

## 2013-10-24 NOTE — Progress Notes (Signed)
INITIAL NUTRITION ASSESSMENT  DOCUMENTATION CODES Per approved criteria  -Not Applicable   INTERVENTION: Provide Resource Breeze once daily until diet advanced Provide Pro-stat once daily Provide Liquid multivitamin  NUTRITION DIAGNOSIS: Inadequate oral intake related to post-op ileus as evidenced by clear liquid diet.   Goal: Pt to meet >/= 90% of their estimated nutrition needs   Monitor:  Diet advancement, po intake, weight, labs  Reason for Assessment: Malnutrition Screening Tool, score of 3  73 y.o. male  Admitting Dx: <principal problem not specified>  ASSESSMENT: 73 yo male is 3 days s/p laparoscopic lysis of adhesions and ventral hernia repair with mesh by Dr. Autumn Messing. He was discharged yesterday on POD #2. He was tolerating a diet, but had not had a bowel movement yet. After going home, he became more distended and began having hiccups. He has had several episodes of nausea and vomiting. PMH history significant for Type 2 diabetes, hypertension, and hyperlipidemia.   Pt reports that since surgery he has not been able to eat much. He ate a clear liquid tray this AM with NG tube clamped and pt denies nausea and abdominal pain.  Pt states he didn't have a good appetite prior to surgery but, he ate enough to maintain his weight at 210 lbs. Per MD note pt had 2 BM's today. Pt states he is ready to try some solid food and go home.   Height: Ht Readings from Last 1 Encounters:  10/22/13 5\' 11"  (1.803 m)    Weight: Wt Readings from Last 1 Encounters:  10/22/13 211 lb 5 oz (95.851 kg)    Ideal Body Weight: 172 lbs  % Ideal Body Weight: 123%  Wt Readings from Last 10 Encounters:  10/22/13 211 lb 5 oz (95.851 kg)  10/19/13 215 lb 12.8 oz (97.886 kg)  10/19/13 215 lb 12.8 oz (97.886 kg)  10/09/13 211 lb 9.6 oz (95.981 kg)  10/02/13 211 lb (95.709 kg)  08/24/13 211 lb 3.2 oz (95.8 kg)  08/14/13 204 lb (92.534 kg)  01/30/13 212 lb (96.163 kg)  01/06/13 211 lb  (95.709 kg)  08/30/12 210 lb (95.255 kg)    Usual Body Weight: 210 lbs  % Usual Body Weight: 100%  BMI:  Body mass index is 29.49 kg/(m^2).  Estimated Nutritional Needs: Kcal: 2100-2300 Protein: 100-115 grams Fluid: 2.3 L/day  Skin: abdominal incision; bilateral back incision  Diet Order: Clear Liquid  EDUCATION NEEDS: -No education needs identified at this time   Intake/Output Summary (Last 24 hours) at 10/24/13 1359 Last data filed at 10/24/13 1145  Gross per 24 hour  Intake      0 ml  Output    300 ml  Net   -300 ml    Last BM: 2/10   Labs:   Recent Labs Lab 10/21/13 0551 10/22/13 0718 10/22/13 1310 10/23/13 0434  NA 139 142  --  144  K 3.9 4.0  --  4.4  CL 100 95*  --  103  CO2 25 28  --  29  BUN 14 29*  --  27*  CREATININE 0.91 1.15 1.08 1.16  CALCIUM 8.7 9.5  --  9.0  GLUCOSE 279* 313*  --  169*    CBG (last 3)   Recent Labs  10/24/13 0441 10/24/13 0814 10/24/13 1149  GLUCAP 138* 152* 138*    Scheduled Meds: . amLODipine  5 mg Oral Daily  . atorvastatin  20 mg Oral Daily  . enoxaparin (LOVENOX) injection  40 mg  Subcutaneous Daily  . losartan  50 mg Oral Daily   And  . hydrochlorothiazide  12.5 mg Oral Daily  . insulin aspart  0-15 Units Subcutaneous Q4H  . metoCLOPramide (REGLAN) injection  10 mg Intravenous Q6H  . pantoprazole (PROTONIX) IV  40 mg Intravenous QHS    Continuous Infusions: . 0.9 % NaCl with KCl 20 mEq / L 125 mL/hr at 10/24/13 6222    Past Medical History  Diagnosis Date  . Hypertension   . Hyperlipidemia   . Ventral hernia     followed by Dr. Cyril Loosen  . Arthritis     Back  . DVT (deep venous thrombosis)   . Eczema   . Clotting disorder   . Bladder stones     NEEDS TO HAVE SURGERY  . Diabetes mellitus     type  II    Past Surgical History  Procedure Laterality Date  . Cholecystectomy    . Colonoscopy  01-27-06    per Dr. Sharlett Iles, repeat in 10 yrs  . Lumbar laminectomy/decompression  microdiscectomy  09/26/2012    Procedure: LUMBAR LAMINECTOMY/DECOMPRESSION MICRODISCECTOMY 1 LEVEL;  Surgeon: Elaina Hoops, MD;  Location: [facility];  Service: Neurosurgery;  Laterality: Left;  Lumbar Lamiectomy Extraforaminal Diskectomy Lumbar Three-Four Left  . Ventral hernia repair  10/19/2013    DR TOTH  . Ventral hernia repair N/A 10/19/2013    Procedure: LAPAROSCOPIC VENTRAL HERNIA;  Surgeon: Merrie Roof, MD;  Location: [place];  Service: General;  Laterality: N/A;  . Insertion of mesh N/A 10/19/2013    Procedure: INSERTION OF MESH;  Surgeon: Merrie Roof, MD;  Location: [place];  Service: General;  Laterality: N/A;    Pryor Ochoa RD, LDN Inpatient Clinical Dietitian Pager: [phone] After Hours Pager: [phone]

## 2013-10-25 LAB — GLUCOSE, CAPILLARY
Glucose-Capillary: 141 mg/dL — ABNORMAL HIGH (ref 70–99)
Glucose-Capillary: 172 mg/dL — ABNORMAL HIGH (ref 70–99)
Glucose-Capillary: 149 mg/dL — ABNORMAL HIGH (ref 70–99)

## 2013-10-25 LAB — CBC WITH DIFFERENTIAL/PLATELET
Basophils Absolute: 0 10*3/uL (ref 0.0–0.1)
Basophils Relative: 0 % (ref 0–1)
Eosinophils Absolute: 0.2 10*3/uL (ref 0.0–0.7)
Eosinophils Relative: 2 % (ref 0–5)
HCT: 39.6 % (ref 39.0–52.0)
Hemoglobin: 13.9 g/dL (ref 13.0–17.0)
Lymphocytes Relative: 12 % (ref 12–46)
Lymphs Abs: 1.5 10*3/uL (ref 0.7–4.0)
MCH: 31.1 pg (ref 26.0–34.0)
MCHC: 35.1 g/dL (ref 30.0–36.0)
MCV: 88.6 fL (ref 78.0–100.0)
Monocytes Absolute: 1.4 10*3/uL — ABNORMAL HIGH (ref 0.1–1.0)
Monocytes Relative: 11 % (ref 3–12)
Neutro Abs: 9 10*3/uL — ABNORMAL HIGH (ref 1.7–7.7)
Neutrophils Relative %: 75 % (ref 43–77)
Platelets: 216 10*3/uL (ref 150–400)
RBC: 4.47 MIL/uL (ref 4.22–5.81)
RDW: 12.8 % (ref 11.5–15.5)
WBC: 12.1 10*3/uL — ABNORMAL HIGH (ref 4.0–10.5)

## 2013-10-25 LAB — BASIC METABOLIC PANEL
BUN: 15 mg/dL (ref 6–23)
CO2: 19 mEq/L (ref 19–32)
Calcium: 8.1 mg/dL — ABNORMAL LOW (ref 8.4–10.5)
Chloride: 107 mEq/L (ref 96–112)
Creatinine, Ser: 0.91 mg/dL (ref 0.50–1.35)
GFR calc Af Amer: >90 mL/min (ref >90)
GFR calc non Af Amer: 83 mL/min — ABNORMAL LOW (ref >90)
Glucose, Bld: 151 mg/dL — ABNORMAL HIGH (ref 70–99)
Potassium: 3.7 mEq/L (ref 3.7–5.3)
Sodium: 142 mEq/L (ref 137–147)

## 2013-10-25 NOTE — Progress Notes (Signed)
  Subjective: Complains of sore throat. Wants to go home  Objective: Vital signs in last 24 hours: Temp:  [98.1 F (36.7 C)-98.7 F (37.1 C)] 98.1 F (36.7 C) (02/11 4818) Pulse Rate:  [75-81] 81 (02/11 0632) Resp:  [18-19] 18 (02/11 0632) BP: (139-143)/(78-85) 141/85 mmHg (02/11 0632) SpO2:  [93 %-97 %] 93 % (02/11 5631) Last BM Date: 26-Oct-2013  Intake/Output from previous day: 10/26/2022 0701 - 02/11 0700 In: 2870.8 [I.V.:2870.8] Out: 875 [Urine:875] Intake/Output this shift:    Resp: clear to auscultation bilaterally Cardio: regular rate and rhythm GI: soft, nontender. good bs. good flatus and bm  Lab Results:   Recent Labs  10/23/13 0434 10/25/13 0327  WBC 14.0* 12.1*  HGB 15.1 13.9  HCT 42.8 39.6  PLT 275 216   BMET  Recent Labs  10/23/13 0434 10/25/13 0327  NA 144 142  K 4.4 3.7  CL 103 107  CO2 29 19  GLUCOSE 169* 151*  BUN 27* 15  CREATININE 1.16 0.91  CALCIUM 9.0 8.1*   PT/INR No results found for this basename: LABPROT, INR,  in the last 72 hours ABG No results found for this basename: PHART, PCO2, PO2, HCO3,  in the last 72 hours  Studies/Results: Dg Abd 2 Views  2013/10/26   CLINICAL DATA:  Ileus.  EXAM: ABDOMEN - 2 VIEW  COMPARISON:  10/22/2013  FINDINGS: NG tube is in place with the tip in the distal stomach. There has been decrease in the number of distended small bowel loops. There is decreased air in the nondistended colon. No free air on the upright radiograph.  IMPRESSION: Improving ileus after ventral hernia repair.   Electronically Signed   By: Rozetta Nunnery M.D.   On: 10-26-13 07:51    Anti-infectives: Anti-infectives   None      Assessment/Plan: s/p * No surgery found * Advance diet D/c ng Will discharge later today if he tolerates diet  LOS: 3 days    TOTH III,Ethel Veronica S 10/25/2013

## 2013-10-25 NOTE — Discharge Summary (Signed)
Physician Discharge Summary  Patient ID: Bobby Lopez MRN: [id number] DOB/AGE: [birth year] 73 y.o.  Admit date: 10/22/2013 Discharge date: 10/25/2013  Admission Diagnoses:  Discharge Diagnoses:  Active Problems:   Postoperative ileus   Discharged Condition: good  Hospital Course: the pt underwent lap ventral hernia repair with mesh. He went home on pod 2 but developed vomiting and was readmitted. He had an ng for several days but he gradually improved and is now ready for discharge home  Consults: None  Significant Diagnostic Studies: nnone  Treatments: surgery: as above  Discharge Exam: Blood pressure 141/85, pulse 81, temperature 98.1 F (36.7 C), temperature source Oral, resp. rate 18, height 5\' 11"  (1.803 m), weight 211 lb 5 oz (95.851 kg), SpO2 93.00%. GI: soft, nontender. good bs  Disposition: 01-Home or Self Care  Discharge Orders   Future Appointments Provider Department Dept Phone   11/01/2013 1:30 PM Merrie Roof, MD [facility], [place] [phone]   Future Orders Complete By Expires   Call MD for:  difficulty breathing, headache or visual disturbances  As directed    Call MD for:  extreme fatigue  As directed    Call MD for:  hives  As directed    Call MD for:  persistant dizziness or light-headedness  As directed    Call MD for:  persistant nausea and vomiting  As directed    Call MD for:  redness, tenderness, or signs of infection (pain, swelling, redness, odor or green/yellow discharge around incision site)  As directed    Call MD for:  severe uncontrolled pain  As directed    Call MD for:  temperature >100.4  As directed    Diet - low sodium heart healthy  As directed    Discharge instructions  As directed    Comments:     No heavy lifting. May shower. Wear abdominal binder if comfortable   Increase activity slowly  As directed    No wound care  As directed        Medication List         ALIGN PO  Take 1 tablet by mouth daily.     amLODipine 5 MG tablet  Commonly known as:  NORVASC  Take 5 mg by mouth daily.     atorvastatin 20 MG tablet  Commonly known as:  LIPITOR  Take 20 mg by mouth daily.     FISH OIL PO  Take 1 capsule by mouth daily.     Garlic Tabs  Take 1 each by mouth daily.     losartan-hydrochlorothiazide 50-12.5 MG per tablet  Commonly known as:  HYZAAR  Take 1 tablet by mouth daily.     metFORMIN 500 MG tablet  Commonly known as:  GLUCOPHAGE  TAKE 1 TABLET BY MOUTH 2 TIMES DAILY WITH A MEAL.     multivitamin with minerals Tabs tablet  Take 1 tablet by mouth daily.     OVER THE COUNTER MEDICATION  Take 1 tablet by mouth daily. Prostate health vitamin     oxyCODONE-acetaminophen 5-325 MG per tablet  Commonly known as:  PERCOCET/ROXICET           Follow-up Information   Follow up with TOTH III,Octavia Velador S, MD In 2 weeks.   Specialty:  General Surgery   Contact information:   [address] [place] [address] [phone]       Signed: Merrie Roof 10/25/2013, 8:13 AM

## 2013-10-28 ENCOUNTER — Other Ambulatory Visit: Payer: Self-pay | Admitting: Family Medicine

## 2013-11-01 ENCOUNTER — Encounter (INDEPENDENT_AMBULATORY_CARE_PROVIDER_SITE_OTHER): Payer: 59 | Admitting: General Surgery

## 2013-11-02 ENCOUNTER — Ambulatory Visit (INDEPENDENT_AMBULATORY_CARE_PROVIDER_SITE_OTHER): Payer: Medicare Other | Admitting: Family Medicine

## 2013-11-02 ENCOUNTER — Telehealth (INDEPENDENT_AMBULATORY_CARE_PROVIDER_SITE_OTHER): Payer: Self-pay | Admitting: General Surgery

## 2013-11-02 ENCOUNTER — Telehealth: Payer: Self-pay

## 2013-11-02 ENCOUNTER — Other Ambulatory Visit (INDEPENDENT_AMBULATORY_CARE_PROVIDER_SITE_OTHER): Payer: Self-pay

## 2013-11-02 ENCOUNTER — Telehealth (INDEPENDENT_AMBULATORY_CARE_PROVIDER_SITE_OTHER): Payer: Self-pay

## 2013-11-02 ENCOUNTER — Ambulatory Visit (HOSPITAL_COMMUNITY)
Admission: RE | Admit: 2013-11-02 | Discharge: 2013-11-02 | Disposition: A | Discharge status: Home / Self Care | Payer: Medicare Other | Source: Ambulatory Visit | Attending: General Surgery | Admitting: General Surgery

## 2013-11-02 ENCOUNTER — Encounter: Payer: Self-pay | Admitting: Family Medicine

## 2013-11-02 VITALS — BP 128/70 | HR 89

## 2013-11-02 DIAGNOSIS — I82409 Acute embolism and thrombosis of unspecified deep veins of unspecified lower extremity: Secondary | ICD-10-CM

## 2013-11-02 DIAGNOSIS — M7989 Other specified soft tissue disorders: Secondary | ICD-10-CM

## 2013-11-02 NOTE — Telephone Encounter (Signed)
Patient's wife calling into office to report that [name] had an increase in thigh tightness, hip pain and lower extremity swelling.  Patient states this started last night with no improvement over the day.  Dr. Marlou Starks not available.  I reviewed with Dr. Barry Dienes and an order for Bilateral Lower Extremity Venous Duplex has been ordered STAT.  Called [facility] and have it scheduled for today @ 3:30pm.  Patient made aware of appointment and we will await a call report from Vascular.  Message will be routed to Dr. Marlou Starks and Sharyn Lull to make aware.   Patient s/p Ventral Hernia Repair 10/19/13.

## 2013-11-02 NOTE — Telephone Encounter (Signed)
Pt came into office Per Dr. Elease Hashimoto to talk about DVT. Pt was given samples of Xarelto and pt is it to set up appt to see Dr. Sarajane Jews.

## 2013-11-02 NOTE — Telephone Encounter (Signed)
Discussed acute DVT with Dr. Elease Hashimoto who is covering for Dr. Sarajane Jews.  Pt has acute RLE DVT.    Dr. Elease Hashimoto office to take care of getting anticoagulated.  Phone number of vascular lab where pt was given to PCP office.

## 2013-11-02 NOTE — Telephone Encounter (Signed)
[place] Ar, [facility] vascular lab, called to report this pt has a DVT in the Lt leg.  They will hold pt while Dr. Barry Dienes is update on dispensation.

## 2013-11-02 NOTE — Progress Notes (Signed)
Pre visit review using our clinic review tool, if applicable. No additional management support is needed unless otherwise documented below in the visit note. 

## 2013-11-02 NOTE — Progress Notes (Signed)
*  PRELIMINARY RESULTS* Vascular Ultrasound Bilateral Lower Extremity Venous has been completed.  Preliminary findings: Right lower extremity negative for DVT. Positive for DVT in the CFV, SFV, and proximal popliteal vein in the left lower extremity.   Results were called to Ivor Costa who spoke with Dr. Barry Dienes. Patient was instructed to go to office for medication.  Hulan Fray, RVT 11/02/2013, 4:58 PM

## 2013-11-02 NOTE — Progress Notes (Signed)
Subjective:    Patient ID: Bobby Lopez, male    DOB: [birth year], 73 y.o.   MRN: [id number]  HPI  Acute visit Received a call earlier today from surgeon's office that patient had acute DVT left lower extremity. Recent history is that he had ventral hernia repair a few weeks ago. He developed acute left leg pain yesterday with increased edema today. Venous Doppler by verbal report acute DVT left lower extremity. He denies any dyspnea, hemoptysis, or chest pain. Patient relates he had what sounds like acute DVT following surgical procedure for gallbladder removal back in 1987.  His left leg pain is relatively mild. His chronic problems include type 2 diabetes, hyperlipidemia, hypertension.  Also has had history of kidney stones. He denies any recent hematuria, hematochezia, or any other bleeding complications. No history of CVA.  Past Medical History  Diagnosis Date  . Hypertension   . Hyperlipidemia   . Ventral hernia     followed by Dr. Cyril Loosen  . Arthritis     Back  . DVT (deep venous thrombosis)   . Eczema   . Clotting disorder   . Bladder stones     NEEDS TO HAVE SURGERY  . Diabetes mellitus     type  II   Past Surgical History  Procedure Laterality Date  . Cholecystectomy    . Colonoscopy  01-27-06    per Dr. Sharlett Iles, repeat in 10 yrs  . Lumbar laminectomy/decompression microdiscectomy  09/26/2012    Procedure: LUMBAR LAMINECTOMY/DECOMPRESSION MICRODISCECTOMY 1 LEVEL;  Surgeon: Elaina Hoops, MD;  Location: [place] [facility];  Service: Neurosurgery;  Laterality: Left;  Lumbar Lamiectomy Extraforaminal Diskectomy Lumbar Three-Four Left  . Ventral hernia repair  10/19/2013    DR TOTH  . Ventral hernia repair N/A 10/19/2013    Procedure: LAPAROSCOPIC VENTRAL HERNIA;  Surgeon: Merrie Roof, MD;  Location: [place];  Service: General;  Laterality: N/A;  . Insertion of mesh N/A 10/19/2013    Procedure: INSERTION OF MESH;  Surgeon: Merrie Roof, MD;  Location: [place];  Service:  General;  Laterality: N/A;    reports that he has quit smoking. He has never used smokeless tobacco. He reports that he does not drink alcohol or use illicit drugs. family history includes Diabetes in his mother; Hypertension in his mother. Allergies  Allergen Reactions  . Ace Inhibitors Cough    cough  . Hydrocodone Nausea And Vomiting      Review of Systems  Constitutional: Negative for fatigue.  Eyes: Negative for visual disturbance.  Respiratory: Negative for cough, chest tightness and shortness of breath.   Cardiovascular: Positive for leg swelling. Negative for chest pain and palpitations.  Neurological: Negative for dizziness, syncope, weakness, light-headedness and headaches.       Objective:   Physical Exam  Constitutional: He appears well-developed and well-nourished.  Neck: Neck supple. No thyromegaly present.  Cardiovascular: Normal rate and regular rhythm.   Pulmonary/Chest: Effort normal and breath sounds normal. No respiratory distress. He has no wheezes. He has no rales.  Musculoskeletal:  Left leg reveals one plus pitting edema of the legs. He has some tenderness involving the left calf region. No significant color changes          Assessment & Plan:  Acute DVT left lower extremity following recent surgical procedure. He does not have any red flags such as chest pains or dyspnea or hemoptysis. He does not have any known contraindications at this time for anticoagulation. Provided  samples of Xarelto 15 mg twice daily for 21 days then transition to 20 mg once daily. We'll schedule followup with primary physician early next week.

## 2013-11-02 NOTE — Patient Instructions (Signed)
Deep Vein Thrombosis A deep vein thrombosis (DVT) is a blood clot that develops in the deep, larger veins of the leg, arm, or pelvis. These are more dangerous than clots that might form in veins near the surface of the body. A DVT can lead to complications if the clot breaks off and travels in the bloodstream to the lungs.  A DVT can damage the valves in your leg veins, so that instead of flowing upward, the blood pools in the lower leg. This is called post-thrombotic syndrome, and it can result in pain, swelling, discoloration, and sores on the leg. CAUSES Usually, several things contribute to blood clots forming. Contributing factors include:  The flow of blood slows down.  The inside of the vein is damaged in some way.  You have a condition that makes blood clot more easily. RISK FACTORS Some people are more likely than others to develop blood clots. Risk factors include:   Older age, especially over 75 years of age.  Having a family history of blood clots or if you have already had a blot clot.  Having major or lengthy surgery. This is especially true for surgery on the hip, knee, or belly (abdomen). Hip surgery is particularly high risk.  Breaking a hip or leg.  Sitting or lying still for a long time. This includes long-distance travel, paralysis, or recovery from an illness or surgery.  Having cancer or cancer treatment.  Having a long, thin tube (catheter) placed inside a vein during a medical procedure.  Being overweight (obese).  Pregnancy and childbirth.  Hormone changes make the blood clot more easily during pregnancy.  The fetus puts pressure on the veins of the pelvis.  There is a risk of injury to veins during delivery or a caesarean. The risk is highest just after childbirth.  Medicines with the male hormone estrogen. This includes birth control pills and hormone replacement therapy.  Smoking.  Other circulation or heart problems.  SIGNS AND SYMPTOMS When  a clot forms, it can either partially or totally block the blood flow in that vein. Symptoms of a DVT can include:  Swelling of the leg or arm, especially if one side is much worse.  Warmth and redness of the leg or arm, especially if one side is much worse.  Pain in an arm or leg. If the clot is in the leg, symptoms may be more noticeable or worse when standing or walking. The symptoms of a DVT that has traveled to the lungs (pulmonary embolism, PE) usually start suddenly and include:  Shortness of breath.  Coughing.  Coughing up blood or blood-tinged phlegm.  Chest pain. The chest pain is often worse with deep breaths.  Rapid heartbeat. Anyone with these symptoms should get emergency medical treatment right away. Call your local emergency services (911 in the [place] if you have these symptoms. DIAGNOSIS If a DVT is suspected, your health care provider will take a full medical history and perform a physical exam. Tests that also may be required include:  Blood tests, including studies of the clotting properties of the blood.  Ultrasonography to see if you have clots in your legs or lungs.  X-rays to show the flow of blood when dye is injected into the veins (venography).  Studies of your lungs if you have any chest symptoms. PREVENTION  Exercise the legs regularly. Take a brisk 30-minute walk every day.  Maintain a weight that is appropriate for your height.  Avoid sitting or lying in bed   for long periods of time without moving your legs.  Women, particularly those over the age of 45 years, should consider the risks and benefits of taking estrogen medicines, including birth control pills.  Do not smoke, especially if you take estrogen medicines.  Long-distance travel can increase your risk of DVT. You should exercise your legs by walking or pumping the muscles every hour.  In-hospital prevention:  Many of the risk factors above relate to situations that exist with  hospitalization, either for illness, injury, or elective surgery.  Your health care provider will assess you for the need for venous thromboembolism prophylaxis when you are admitted to the hospital. If you are having surgery, your surgeon will assess you the day of or day after surgery.  Prevention may include medical and nonmedical measures. TREATMENT Once identified, a DVT can be treated. It can also be prevented in some circumstances. Once you have had a DVT, you may be at increased risk for a DVT in the future. The most common treatment for DVT is blood thinning (anticoagulant) medicine, which reduces the blood's tendency to clot. Anticoagulants can stop new blood clots from forming and stop old ones from growing. They cannot dissolve existing clots. Your body does this by itself over time. Anticoagulants can be given by mouth, by IV access, or by injection. Your health care provider will determine the best program for you. Other medicines or treatments that may be used are:  Heparin or related medicines (low molecular weight heparin) are usually the first treatment for a blood clot. They act quickly. However, they cannot be taken orally.  Heparin can cause a fall in a component of blood that stops bleeding and forms blood clots (platelets). You will be monitored with blood tests to be sure this does not occur.  Warfarin is an anticoagulant that can be swallowed. It takes a few days to start working, so usually heparin or related medicines are used in combination. Once warfarin is working, heparin is usually stopped.  Less commonly, clot dissolving drugs (thrombolytics) are used to dissolve a DVT. They carry a high risk of bleeding, so they are used mainly in severe cases, where your life or a limb is threatened.  Very rarely, a blood clot in the leg needs to be removed surgically.  If you are unable to take anticoagulants, your health care provider may arrange for you to have a filter placed  in a main vein in your abdomen. This filter prevents clots from traveling to your lungs. HOME CARE INSTRUCTIONS  Take all medicines prescribed by your health care provider. Only take over-the-counter or prescription medicines for pain, fever, or discomfort as directed by your health care provider.  Warfarin. Most people will continue taking warfarin after hospital discharge. Your health care provider will advise you on the length of treatment (usually 3 6 months, sometimes lifelong).  Too much and too little warfarin are both dangerous. Too much warfarin increases the risk of bleeding. Too little warfarin continues to allow the risk for blood clots. While taking warfarin, you will need to have regular blood tests to measure your blood clotting time. These blood tests usually include both the prothrombin time (PT) and international normalized ratio (INR) tests. The PT and INR results allow your health care provider to adjust your dose of warfarin. The dose can change for many reasons. It is critically important that you take warfarin exactly as prescribed, and that you have your PT and INR levels drawn exactly as directed.  Many foods, especially foods high in vitamin K, can interfere with warfarin and affect the PT and INR results. Foods high in vitamin K include spinach, kale, broccoli, cabbage, collard and turnip greens, brussel sprouts, peas, cauliflower, seaweed, and parsley as well as beef and pork liver, green tea, and soybean oil. You should eat a consistent amount of foods high in vitamin K. Avoid major changes in your diet, or notify your health care provider before changing your diet. Arrange a visit with a dietitian to answer your questions.  Many medicines can interfere with warfarin and affect the PT and INR results. You must tell your health care provider about any and all medicines you take. This includes all vitamins and supplements. Be especially cautious with aspirin and  anti-inflammatory medicines. Ask your health care provider before taking these. Do not take or discontinue any prescribed or over-the-counter medicine except on the advice of your health care provider or pharmacist.  Warfarin can have side effects, primarily excessive bruising or bleeding. You will need to hold pressure over cuts for longer than usual. Your health care provider or pharmacist will discuss other potential side effects.  Alcohol can change the body's ability to handle warfarin. It is best to avoid alcoholic drinks or consume only very small amounts while taking warfarin. Notify your health care provider if you change your alcohol intake.  Notify your dentist or other health care providers before procedures.  Activity. Ask your health care provider how soon you can go back to normal activities. It is important to stay active to prevent blood clots. If you are on anticoagulant medicine, avoid contact sports.  Exercise. It is very important to exercise. This is especially important while traveling, sitting, or standing for long periods of time. Exercise your legs by walking or by pumping the muscles frequently. Take frequent walks.  Compression stockings. These are tight elastic stockings that apply pressure to the lower legs. This pressure can help keep the blood in the legs from clotting. You may need to wear compression stockings at home to help prevent a DVT.  Do not smoke. If you smoke, quit. Ask your health care provider for help with quitting smoking.  Learn as much as you can about DVT. Knowing more about the condition should help you keep it from coming back.  Wear a medical alert bracelet or carry a medical alert card. SEEK MEDICAL CARE IF:  You notice a rapid heartbeat.  You feel weaker or more tired than usual.  You feel faint.  You notice increased bruising.  You feel your symptoms are not getting better in the time expected.  You believe you are having side  effects of medicine. SEEK IMMEDIATE MEDICAL CARE IF:  You have chest pain.  You have trouble breathing.  You have new or increased swelling or pain in one leg.  You cough up blood.  You notice blood in vomit, in a bowel movement, or in urine. MAKE SURE YOU:  Understand these instructions.  Will watch your condition.  Will get help right away if you are not doing well or get worse. Document Released: 08/31/2005 Document Revised: 06/21/2013 Document Reviewed: 05/08/2013 [facility] Patient Information 2014 [place].  Start Xarelto 15 mg one twice daily for 21 days then dose will transition to 20 mg once daily

## 2013-11-03 ENCOUNTER — Ambulatory Visit (INDEPENDENT_AMBULATORY_CARE_PROVIDER_SITE_OTHER): Payer: 59 | Admitting: General Surgery

## 2013-11-03 ENCOUNTER — Encounter (INDEPENDENT_AMBULATORY_CARE_PROVIDER_SITE_OTHER): Payer: Self-pay | Admitting: General Surgery

## 2013-11-03 VITALS — BP 104/64 | HR 88 | Temp 98.8°F | Resp 14 | Ht 72.0 in | Wt 202.6 lb

## 2013-11-03 DIAGNOSIS — K439 Ventral hernia without obstruction or gangrene: Secondary | ICD-10-CM

## 2013-11-03 NOTE — Patient Instructions (Signed)
Elevate leg as much as you can

## 2013-11-03 NOTE — Progress Notes (Signed)
Subjective:     Patient ID: Bobby Lopez, male   DOB: [birth year], 73 y.o.   MRN: [id number]  HPI The patient is a 73 year old white male who is 2 weeks status post laparoscopic ventral hernia repair with mesh. About 2 nights ago he developed some pain in his in her left thigh. The following morning he developed some swelling of his left calf. He underwent a duplex ultrasound study that showed a clot in his left leg. He was seen by his medical doctor and they started him on Xarelto. He denies any abdominal pain. His appetite is good and his bowels are working normally.  Review of Systems     Objective:   Physical Exam On exam his abdomen is soft and nontender. His incisions are healing nicely with no sign of infection. His abdominal wall feels solid with no palpable evidence of recurrence of the hernia. His left calf is swollen with mild tenderness.    Assessment:     The patient is 2 weeks status post laparoscopic ventral hernia repair with mesh which is now complicated by a DVT 2 weeks after surgery     Plan:     At this point he will continue with the blood thinners. I recommended that he elevate his left leg is much as possible. He will continue to refrain from any heavy lifting. I will plan to see him back in about one month

## 2013-11-06 ENCOUNTER — Encounter: Payer: Self-pay | Admitting: Family Medicine

## 2013-11-06 ENCOUNTER — Ambulatory Visit (INDEPENDENT_AMBULATORY_CARE_PROVIDER_SITE_OTHER): Payer: Medicare Other | Admitting: Family Medicine

## 2013-11-06 VITALS — BP 110/62 | HR 109 | Temp 99.5°F | Ht 72.0 in | Wt 206.0 lb

## 2013-11-06 DIAGNOSIS — I82409 Acute embolism and thrombosis of unspecified deep veins of unspecified lower extremity: Secondary | ICD-10-CM

## 2013-11-06 NOTE — Progress Notes (Signed)
   Subjective:    Patient ID: Bobby Lopez, male    DOB: 15-Jul-1941, 73 y.o.   MRN: [id number]  HPI Here to follow up a left leg DVT. He had a ventral hernia repair a few weeks ago and this went well. He had a follow up visit with his surgeon Dr. Marlou Starks on 11-03-13, and he was pleased with his progress. His bowels and bladder are doing well. However he developed pain in the left thigh and swelling in the left calf and a venous doppler revealed a DVT in the left common femoral, femoral, and popliteal veins. He was seen here on 11-02-13 and was started on Xarelto per protocol. Since then he has done well except for swelling and mild pain in the leg. No chest pain or SOB. He and his wife are very worried about bleeding risks with Xarelto and ask about using Coumadin instead. He had a DVT after gall bladder surgery in the 1980s also.    Review of Systems  Constitutional: Negative.   Respiratory: Negative.   Cardiovascular: Positive for leg swelling. Negative for chest pain and palpitations.       Objective:   Physical Exam  Constitutional: He appears well-developed.  Cardiovascular: Normal rate, regular rhythm, normal heart sounds and intact distal pulses.   Pulmonary/Chest: Effort normal and breath sounds normal.  Musculoskeletal:  Mildly tender along the medial left thigh and in the left calf. A cord is palpable in the calf. He has edema in the left leg below the knee.           Assessment & Plan:  Recurrent DVT. We will refer to Hematology for a hypercoagulability evaluation. Stay on Xarelto for now.

## 2013-11-06 NOTE — Progress Notes (Signed)
Pre visit review using our clinic review tool, if applicable. No additional management support is needed unless otherwise documented below in the visit note. 

## 2013-11-08 ENCOUNTER — Telehealth: Payer: Self-pay | Admitting: Hematology and Oncology

## 2013-11-08 NOTE — Telephone Encounter (Signed)
S/W PATIENT AND GAVE NEW PATIENT APPT 03/02 @ 9:45 W/DR. [place].  REFERRING DR. Annie Main FRY DX- ACUTE DVT WELCOME PACKET MAILED.

## 2013-11-08 NOTE — Telephone Encounter (Signed)
C/D 11/08/13 for appt. 11/13/13

## 2013-11-11 ENCOUNTER — Other Ambulatory Visit: Payer: Self-pay | Admitting: Family Medicine

## 2013-11-13 ENCOUNTER — Encounter: Payer: Self-pay | Admitting: Hematology and Oncology

## 2013-11-13 ENCOUNTER — Ambulatory Visit: Payer: Medicare Other

## 2013-11-13 ENCOUNTER — Encounter: Payer: Self-pay | Admitting: Pharmacist

## 2013-11-13 ENCOUNTER — Ambulatory Visit (HOSPITAL_BASED_OUTPATIENT_CLINIC_OR_DEPARTMENT_OTHER): Payer: Medicare Other | Admitting: Hematology and Oncology

## 2013-11-13 ENCOUNTER — Telehealth: Payer: Self-pay | Admitting: Hematology and Oncology

## 2013-11-13 VITALS — BP 110/71 | HR 84 | Temp 98.2°F | Resp 18 | Ht 72.0 in | Wt 204.5 lb

## 2013-11-13 DIAGNOSIS — R319 Hematuria, unspecified: Secondary | ICD-10-CM

## 2013-11-13 DIAGNOSIS — I82409 Acute embolism and thrombosis of unspecified deep veins of unspecified lower extremity: Secondary | ICD-10-CM | POA: Diagnosis not present

## 2013-11-13 MED ORDER — WARFARIN SODIUM 5 MG PO TABS: 5.0000 mg | ORAL_TABLET | Freq: Every day | ORAL | Status: DC

## 2013-11-13 NOTE — Progress Notes (Signed)
Checked in new patient with no financial issues. He has appt card. °

## 2013-11-13 NOTE — Progress Notes (Signed)
Addendum: Length of anticoag: 3 mos per Dr. Alvy Bimler. Kennith Center, Pharm.D., CPP 11/13/2013@2 :32 PM

## 2013-11-13 NOTE — Telephone Encounter (Signed)
, °

## 2013-11-13 NOTE — Progress Notes (Signed)
New consult for Coumadin management from Dr. Alvy Bimler Indication for anticoag: acute, recurrent DVT -- first DVT was in 1980's s/p cholecystectomy; now w/ LLE DVT dx on 11/02/13 s/p ventral hernia repair 10/19/13 INR goal = 2-3 Pt started on Xarelto on 11/02/13 but due to concerns about bleeding, pt prefers to be on Coumadin.  Dr. Alvy Bimler saw pt today & switched pt to Coumadin 5 mg daily. 1st visit to [facility] Coumadin clinic will be 11/17/13. Duration of anticoag: To Be Determined...(will need to f/u w/ MD) Kennith Center, Pharm.D., CPP 11/13/2013@12 :21 PM

## 2013-11-14 NOTE — Progress Notes (Signed)
[facility] CONSULT NOTE  Patient Care Team: Nelwyn Salisbury, MD as PCP - General Crist Fat, MD as Attending Physician (Urology) Artis Delay, MD as Consulting Physician (Hematology and Oncology)  CHIEF COMPLAINTS/PURPOSE OF CONSULTATION:  Recurrent DVT  HISTORY OF PRESENTING ILLNESS:  Bobby Lopez 73 y.o. male is here because of diagnosis of recurrent DVT.  According to the patient, in 1987, the patient presented to another facility with acute cholecystitis. The patient have ERCP for stone extraction the first followed by several days of hospitalization for antibiotic treatment. Within 3 days after his presentation, the patient was diagnosed with DVT. The patient could not remember which side of his leg that was involved with DVT. At that time, he was told that dehydration and immobilization play a role in the episode of DVT. Subsequently he had open cholecystectomy. Overall, the patient was only anticoagulated for approximately 8 days or so.  Recently, the patient presented with ventral hernia requiring surgery.  On 10/19/2013, he had laparoscopic repair of ventral hernia and insertion of mesh.  Postoperatively, the patient developed complications with nausea, vomiting and dehydration. The patient was admitted to the hospital from 10/22/2013 to 10/25/2013. After dismissal from the hospital, he presented with left lower extremity swelling and pain. On 11/02/2013, he underwent ultrasound alone extremity which show acute deep vein thrombosis involving the left common femoral vein, left femoral vein and left popliteal vein. The patient was placed on anticoagulation therapy with Xarelto. Complicating this matter, he started to develop itching in both inner thighs within a few days taking Xarelto. The patient also had significant hematuria on an ongoing basis. According to the patient had complaints of hematuria even before he was placed on anticoagulation therapy. However,  recently, he had worsening hematuria on a daily basis. He denied passage of clots. He was seen by the urologist and was told he had bladder stones requiring surgery.  However with recent DVT history, the surgery was cancelled and rescheduled to May. He is then referred here for further management.  He complained of mild left lower extremity swelling and warmth but denies tenderness & erythema.  He denies recent chest pain on exertion, shortness of breath on minimal exertion, pre-syncopal episodes, hemoptysis, or palpitation. He had no prior history or diagnosis of cancer. His age appropriate screening programs are up-to-date. He had prior surgeries before with spine surgery a few years ago and never had perioperative thromboembolic events. The patient had never been pm testosterone replacement therapy. There is no family history of blood clots.  MEDICAL HISTORY:  Past Medical History  Diagnosis Date  . Hypertension   . Hyperlipidemia   . Ventral hernia     followed by Dr. Alyse Low  . Arthritis     Back  . DVT (deep venous thrombosis)   . Eczema   . Clotting disorder   . Bladder stones     NEEDS TO HAVE SURGERY  . Diabetes mellitus     type  II  . Hematuria 11/13/2013    SURGICAL HISTORY: Past Surgical History  Procedure Laterality Date  . Cholecystectomy    . Colonoscopy  01-27-06    per Dr. Jarold Motto, repeat in 10 yrs  . Lumbar laminectomy/decompression microdiscectomy  09/26/2012    Procedure: LUMBAR LAMINECTOMY/DECOMPRESSION MICRODISCECTOMY 1 LEVEL;  Surgeon: Mariam Dollar, MD;  Location: [facility];  Service: Neurosurgery;  Laterality: Left;  Lumbar Lamiectomy Extraforaminal Diskectomy Lumbar Three-Four Left  . Ventral hernia repair  10/19/2013  DR TOTH  . Ventral hernia repair N/A 10/19/2013    Procedure: LAPAROSCOPIC VENTRAL HERNIA;  Surgeon: Merrie Roof, MD;  Location: [place];  Service: General;  Laterality: N/A;  . Insertion of mesh N/A 10/19/2013    Procedure:  INSERTION OF MESH;  Surgeon: Merrie Roof, MD;  Location: [place];  Service: General;  Laterality: N/A;    SOCIAL HISTORY: History   Social History  . Marital Status: Married    Spouse Name: N/A    Number of Children: N/A  . Years of Education: N/A   Occupational History  . Not on file.   Social History Main Topics  . Smoking status: Former Research scientist (life sciences)  . Smokeless tobacco: Never Used     Comment: quit > 46 years ago  . Alcohol Use: No  . Drug Use: No  . Sexual Activity: Not on file   Other Topics Concern  . Not on file   Social History Narrative  . No narrative on file    FAMILY HISTORY: Family History  Problem Relation Age of Onset  . Diabetes Mother   . Hypertension Mother     ALLERGIES:  is allergic to ace inhibitors and hydrocodone.  MEDICATIONS:  Current Outpatient Prescriptions  Medication Sig Dispense Refill  . amLODipine (NORVASC) 5 MG tablet Take 5 mg by mouth daily.      Marland Kitchen atorvastatin (LIPITOR) 20 MG tablet Take 20 mg by mouth daily.      . Garlic TABS Take 1 each by mouth daily.       Marland Kitchen losartan-hydrochlorothiazide (HYZAAR) 50-12.5 MG per tablet Take 1 tablet by mouth daily.      . metFORMIN (GLUCOPHAGE) 500 MG tablet TAKE 1 TABLET BY MOUTH 2 TIMES DAILY WITH A MEAL.  60 tablet  4  . Multiple Vitamin (MULTIVITAMIN WITH MINERALS) TABS Take 1 tablet by mouth daily.      . Omega-3 Fatty Acids (FISH OIL PO) Take 1 capsule by mouth daily.      Marland Kitchen OVER THE COUNTER MEDICATION Take 1 tablet by mouth daily. Prostate health vitamin      . Probiotic Product (ALIGN PO) Take 1 tablet by mouth daily.       . Rivaroxaban (XARELTO) 15 MG TABS tablet Take 15 mg by mouth 2 (two) times daily with a meal.      . atorvastatin (LIPITOR) 20 MG tablet TAKE 1 TABLET (20 MG TOTAL) BY MOUTH DAILY.  90 tablet  0  . warfarin (COUMADIN) 5 MG tablet Take 1 tablet (5 mg total) by mouth daily.  30 tablet  3   Current Facility-Administered Medications  Medication Dose Route Frequency  Provider Last Rate Last Dose  . pneumococcal 23 valent vaccine (PNU-IMMUNE) injection 0.5 mL  0.5 mL Intramuscular Once Laurey Morale, MD        REVIEW OF SYSTEMS:   Constitutional: Denies fevers, chills or abnormal night sweats Eyes: Denies blurriness of vision, double vision or watery eyes Ears, nose, mouth, throat, and face: Denies mucositis or sore throat Respiratory: Denies cough, dyspnea or wheezes Cardiovascular: Denies palpitation, chest discomfort  Gastrointestinal:  Denies nausea, heartburn or change in bowel habits Lymphatics: Denies new lymphadenopathy or easy bruising Neurological:Denies numbness, tingling or new weaknesses Behavioral/Psych: Mood is stable, no new changes  All other systems were reviewed with the patient and are negative.  PHYSICAL EXAMINATION: ECOG PERFORMANCE STATUS: 1 - Symptomatic but completely ambulatory  Filed Vitals:   11/13/13 1003  BP: 110/71  Pulse: 84  Temp: 98.2 F (36.8 C)  Resp: 18   Filed Weights   11/13/13 1003  Weight: 204 lb 8 oz (92.761 kg)    GENERAL:alert, no distress and comfortable SKIN: Noted very mild skin rash throughout his body with areas of eczema in both inner thighs. The rash in the inner thigh is not consistent with drug rash. EYES: normal, conjunctiva are pink and non-injected, sclera clear OROPHARYNX:no exudate, no erythema and lips, buccal mucosa, and tongue normal  NECK: supple, thyroid normal size, non-tender, without nodularity LYMPH:  no palpable lymphadenopathy in the cervical, axillary or inguinal LUNGS: clear to auscultation and percussion with normal breathing effort HEART: regular rate & rhythm and no murmurs and no lower extremity edema ABDOMEN:abdomen soft, non-tender and normal bowel sounds. Well-healed surgical scar Musculoskeletal:no cyanosis of digits and no clubbing  PSYCH: alert & oriented x 3 with fluent speech NEURO: no focal motor/sensory deficits  LABORATORY DATA:  I have reviewed the  data as listed Lab Results  Component Value Date   WBC 12.1* 10/25/2013   HGB 13.9 10/25/2013   HCT 39.6 10/25/2013   MCV 88.6 10/25/2013   PLT 216 10/25/2013    RADIOGRAPHIC STUDIES: I have personally reviewed the radiological images as listed and agreed with the findings in the report. Dg Abd 2 Views  10/24/2013   CLINICAL DATA:  Ileus.  EXAM: ABDOMEN - 2 VIEW  COMPARISON:  10/22/2013  FINDINGS: NG tube is in place with the tip in the distal stomach. There has been decrease in the number of distended small bowel loops. There is decreased air in the nondistended colon. No free air on the upright radiograph.  IMPRESSION: Improving ileus after ventral hernia repair.   Electronically Signed   By: Rozetta Nunnery M.D.   On: 10/24/2013 07:51   Dg Abd Acute W/chest  10/22/2013   CLINICAL DATA:  Abdominal pain, recent surgery  EXAM: ACUTE ABDOMEN SERIES (ABDOMEN 2 VIEW & CHEST 1 VIEW)  COMPARISON:  10/09/2013  FINDINGS: Mild bibasilar atelectatic changes are seen. The cardiac shadow is within normal limits. Free air is noted beneath the right hemidiaphragm consistent with the recent surgical history.  Multiple dilated loops of small bowel with air-fluid levels are noted consistent with an obstructive process. From colonic gas is seen in these changes may represent a partial small bowel obstruction. Correlation with the physical exam is rib recommended. Alternatively this could represent a postoperative ileus. No bony abnormality is seen.  IMPRESSION: Diffuse small bowel dilatation likely representing a partial small bowel obstruction or postoperative ileus. Correlation with the physical exam is recommended.   Electronically Signed   By: Inez Catalina M.D.   On: 10/22/2013 08:01    ASSESSMENT:  Recurrent DVT, complicated by persistent hematuria  PLAN:  I reviewed with the patient about the plan for care for recurrent DVT.  It appeared that both episodes of DVT were provoked by surgery, hospitalization and  possibly dehydration.  This last episode of blood clot appeared to be provoked. We discussed about the pros and cons about testing for thrombophilia disorder. his current anticoagulation therapy will interfere with some the tests and it is not possible to interpret the test results.  Taking him off the anticoagulation therapy to do the tests may precipitate another thrombotic event. I do not see a reason to order excessive testing to screen for thrombophilia disorder now as it would not change our management but I would like to check for thrombophilia disorder once  he completes his current treatment as it would impact on decision whether we need to place him on anticoagulation therapy is for life.   I reviewed his medication list extensively. I told him to discontinue his garlic, fish oil and saw palmetto supplements as all these medications may increase his risk of bleeding.  We discussed about various options of anticoagulation therapies including warfarin, low molecular weight heparin such as Lovenox or newer agents such as Rivaroxaban. Some of the risks and benefits discussed including costs involved, the need for monitoring, risks of life-threatening bleeding/hospitalization, reversibility of each agent in the event of bleeding or overdose, safety profile of each drug and taking into account other social issues such as ease of administration of medications, etc. Ultimately, we have made an informed decision for the patient to switch his treatment to warfarin.    Due to his ongoing active hematuria, I felt that warfarin would be the safest choice of anticoagulation treatment for him. I discussed with him dietary changes that he need to follow due to interaction with warfarin. I will refer him to our anticoagulation therapy clinic for INR monitoring.  The goal for minimum treatment would consist of 3 months anticoagulation therapy until the end of May. I will see him back in a month with repeat blood  work to make sure that he does not develop significant anemia from his ongoing hematuria.   At the end of the 3 months treatment, I plan to repeat ultrasound of the lower extremity and we can plan on referring him back to the urologist for bladder surgery. Alternatively, I also discussed with him about risks and benefits of placement of an IVC filter, stop anticoagulation therapy now and referred him to the urologist office right now to take care of the bladder stone issue/hematuria but the patient preferred to complete his anticoagulation therapy first and not have to IVC filter placed. In the meantime, to reduce his risk of complete bladder outlet obstruction, recommended patient to drink 2-3 L of liquid at day.  Due to extensive DVT, I plan to recommend the patient to use elastic compression in the future to reduce risks of chronic thrombophlebitis.  Orders Placed This Encounter  Procedures  . Protime-INR    Standing Status: Standing     Number of Occurrences: 20     Standing Expiration Date: 11/14/2014  . Basic metabolic panel    Standing Status: Future     Number of Occurrences:      Standing Expiration Date: 11/13/2014  . CBC & Diff and Retic    Standing Status: Future     Number of Occurrences:      Standing Expiration Date: 11/13/2014  . Oncology referral to [facility]    Referral Priority:  Routine    Referral Type:  Consultation    Referral Reason:  Specialty Services Required    Number of Visits Requested:  1    All questions were answered. The patient knows to call the clinic with any problems, questions or concerns. I spent 55 minutes counseling the patient face to face. The total time spent in the appointment was 60 minutes and more than 50% was on counseling.     [facility], [place], MD 11/14/2013 8:23 AM

## 2013-11-17 ENCOUNTER — Ambulatory Visit (HOSPITAL_BASED_OUTPATIENT_CLINIC_OR_DEPARTMENT_OTHER): Payer: Medicare Other | Admitting: Pharmacist

## 2013-11-17 ENCOUNTER — Encounter (HOSPITAL_COMMUNITY): Admission: RE | Payer: Self-pay | Source: Ambulatory Visit

## 2013-11-17 ENCOUNTER — Ambulatory Visit (HOSPITAL_COMMUNITY): Admission: RE | Admit: 2013-11-17 | Payer: Medicare Other | Source: Ambulatory Visit | Admitting: Urology

## 2013-11-17 ENCOUNTER — Other Ambulatory Visit: Payer: Medicare Other

## 2013-11-17 DIAGNOSIS — Z7901 Long term (current) use of anticoagulants: Secondary | ICD-10-CM | POA: Diagnosis not present

## 2013-11-17 DIAGNOSIS — I82409 Acute embolism and thrombosis of unspecified deep veins of unspecified lower extremity: Secondary | ICD-10-CM

## 2013-11-17 LAB — PROTIME-INR
INR: 1.5 — ABNORMAL LOW (ref 2.00–3.50)
Protime: 18 Seconds — ABNORMAL HIGH (ref 10.6–13.4)

## 2013-11-17 LAB — POCT INR: INR: 1.5

## 2013-11-17 SURGERY — CYSTOSCOPY, WITH RETROGRADE PYELOGRAM
Anesthesia: General

## 2013-11-17 NOTE — Progress Notes (Signed)
INR below goal today. Pt and wife seen for first coumadin clinic visit today. Coumadin 5mg  daily started on 11/13/13. Xarelto stopped on 11/13/13.  Garlic, Fish Oil, MVI and Prostate health supplement (saw palmetto) stopped on 11/14/13. Folic Acid 1mg  daily started on 11/14/13. Pt has had 4 doses of coumadin. A full discussion of the nature of anticoagulants has been carried out.  The need for frequent and regular monitoring, precise dosage adjustment and compliance is stressed. Side effects of potential bleeding are discussed.  Drug-drug interactions and drug-food interactions have been discussed.  We reviewed the importance of consistency with Vitamin K intake. I informed him to avoid great swings in general diet. Answered questions regarding sweet tea, cranberry juice and garlic clove intake. It is ok to use garlic in cooking/eating and to drink his occasional sweet teas and cranberry juice (green tea is more of a concern). He doesn't consume great quantities. Warfarin education sheets have been provided to pt.  He does not drink alchohol.  Pt does not use any herbal supplements besides the probiotic. He uses Tylenol for aches/pains PRN. Pt has ongoing hematuria due to bladder stones and is scheduled to have bladder surgery in May. He will take 7.5mg  today. On 11/18/13, continue 5mg  daily.  Recheck INR on 11/20/13; Lab at 10:30am and Coumadin Clinic at 10:45am

## 2013-11-17 NOTE — Patient Instructions (Signed)
Take 7.5mg  today.  On 11/18/13, continue 5mg  daily.  Recheck INR on 11/20/13; Lab at 10:30am and Coumadin Clinic at 10:45am

## 2013-11-20 ENCOUNTER — Other Ambulatory Visit (HOSPITAL_BASED_OUTPATIENT_CLINIC_OR_DEPARTMENT_OTHER): Payer: Medicare Other

## 2013-11-20 ENCOUNTER — Ambulatory Visit (HOSPITAL_BASED_OUTPATIENT_CLINIC_OR_DEPARTMENT_OTHER): Payer: Medicare Other | Admitting: Pharmacist

## 2013-11-20 DIAGNOSIS — I82409 Acute embolism and thrombosis of unspecified deep veins of unspecified lower extremity: Secondary | ICD-10-CM

## 2013-11-20 LAB — PROTIME-INR
INR: 2.3 (ref 2.00–3.50)
Protime: 27.6 Seconds — ABNORMAL HIGH (ref 10.6–13.4)

## 2013-11-20 LAB — POCT INR: INR: 2.3

## 2013-11-20 NOTE — Patient Instructions (Signed)
Continue 5mg  daily.  Recheck INR on 11/28/13; Lab at 3:30pm and Coumadin Clinic at 3:45pm.

## 2013-11-20 NOTE — Progress Notes (Signed)
INR within goal today. Pt took coumadin as instructed (7.5mg  on 11/17/13 and 5mg  on 3/7 & 3/8). No medication changes.  No changes in diet. No concerns regarding anticoagulation. Some occasional right thigh "burning" that resolves when rubbed. Continue 5mg  daily. Recheck INR on 11/28/13; Lab at 3:30pm and Coumadin Clinic at 3:45pm. Will evaluate INR and adjust coumadin dose as necessary.  (May need to add in one day per week of 7.5mg ).

## 2013-11-27 ENCOUNTER — Other Ambulatory Visit: Payer: Self-pay | Admitting: Family Medicine

## 2013-11-27 ENCOUNTER — Ambulatory Visit (INDEPENDENT_AMBULATORY_CARE_PROVIDER_SITE_OTHER): Payer: Medicare Other | Admitting: General Surgery

## 2013-11-27 ENCOUNTER — Encounter (INDEPENDENT_AMBULATORY_CARE_PROVIDER_SITE_OTHER): Payer: Self-pay | Admitting: General Surgery

## 2013-11-27 VITALS — BP 122/76 | Temp 97.8°F | Resp 77 | Ht 72.0 in | Wt 202.0 lb

## 2013-11-27 DIAGNOSIS — K432 Incisional hernia without obstruction or gangrene: Secondary | ICD-10-CM

## 2013-11-27 NOTE — Patient Instructions (Signed)
May return to normal activities 

## 2013-11-28 ENCOUNTER — Other Ambulatory Visit (HOSPITAL_BASED_OUTPATIENT_CLINIC_OR_DEPARTMENT_OTHER): Payer: Medicare Other

## 2013-11-28 ENCOUNTER — Ambulatory Visit (HOSPITAL_BASED_OUTPATIENT_CLINIC_OR_DEPARTMENT_OTHER): Payer: Medicare Other | Admitting: Pharmacist

## 2013-11-28 DIAGNOSIS — I82409 Acute embolism and thrombosis of unspecified deep veins of unspecified lower extremity: Secondary | ICD-10-CM

## 2013-11-28 LAB — PROTIME-INR
INR: 2.8 (ref 2.00–3.50)
Protime: 33.6 Seconds — ABNORMAL HIGH (ref 10.6–13.4)

## 2013-11-28 LAB — POCT INR: INR: 2.8

## 2013-11-28 NOTE — Progress Notes (Signed)
INR at goal Pt seen in clinic with his wife today He is doing well with no complaints No missed or extra doses No unusual bleeding or bruising No diet or medication changes Plan No changes Continue 5mg  daily. Recheck INR on 12/08/13; Lab at 9:30am and Coumadin Clinic at 9:45am and apt with Dr. Alvy Bimler.

## 2013-11-28 NOTE — Patient Instructions (Signed)
INR at goal No changes Continue 5mg  daily. Recheck INR on 12/08/13; Lab at 9:30am and Coumadin Clinic at 9:45am and apt with Dr. Alvy Bimler.

## 2013-11-29 NOTE — Progress Notes (Signed)
Subjective:     Patient ID: Bobby Lopez, male   DOB: 1941-07-18, 73 y.o.   MRN: [id number]  HPI The patient is a 73 year old white male who is about 6 weeks status post laparoscopic ventral hernia repair with mesh. He continues to have some soreness especially when he moves around. His appetite is good his bowels are working normally.  Review of Systems     Objective:   Physical Exam On exam his abdomen is soft with some mild tenderness laterally. His incisions are all healing nicely with no sign of infection. His abdominal wall feels very solid.    Assessment:     The patient is 6 weeks status post laparoscopic ventral hernia repair with mesh     Plan:     At this point I believe he can begin returning to his normal activities without restriction. I think he will have some soreness potentially for several months until the stitches that anchor the mesh continued to loosen up. I will plan to see him back on a when necessary basis

## 2013-12-08 ENCOUNTER — Ambulatory Visit: Payer: Medicare Other | Admitting: Pharmacist

## 2013-12-08 ENCOUNTER — Other Ambulatory Visit (HOSPITAL_BASED_OUTPATIENT_CLINIC_OR_DEPARTMENT_OTHER): Payer: Medicare Other

## 2013-12-08 ENCOUNTER — Ambulatory Visit (HOSPITAL_BASED_OUTPATIENT_CLINIC_OR_DEPARTMENT_OTHER): Payer: Medicare Other | Admitting: Hematology and Oncology

## 2013-12-08 ENCOUNTER — Encounter: Payer: Self-pay | Admitting: Hematology and Oncology

## 2013-12-08 ENCOUNTER — Telehealth: Payer: Self-pay | Admitting: Hematology and Oncology

## 2013-12-08 ENCOUNTER — Other Ambulatory Visit: Payer: Self-pay | Admitting: Pharmacist

## 2013-12-08 VITALS — BP 122/63 | HR 84 | Temp 97.5°F | Resp 18 | Ht 72.0 in | Wt 201.2 lb

## 2013-12-08 DIAGNOSIS — Z7901 Long term (current) use of anticoagulants: Secondary | ICD-10-CM | POA: Diagnosis not present

## 2013-12-08 DIAGNOSIS — I82409 Acute embolism and thrombosis of unspecified deep veins of unspecified lower extremity: Secondary | ICD-10-CM

## 2013-12-08 DIAGNOSIS — R319 Hematuria, unspecified: Secondary | ICD-10-CM | POA: Diagnosis not present

## 2013-12-08 DIAGNOSIS — N210 Calculus in bladder: Secondary | ICD-10-CM | POA: Diagnosis not present

## 2013-12-08 LAB — CBC & DIFF AND RETIC
BASO%: 0.4 % (ref 0.0–2.0)
Basophils Absolute: 0 10*3/uL (ref 0.0–0.1)
EOS%: 1.5 % (ref 0.0–7.0)
Eosinophils Absolute: 0.1 10*3/uL (ref 0.0–0.5)
HCT: 41.2 % (ref 38.4–49.9)
HGB: 13.9 g/dL (ref 13.0–17.1)
Immature Retic Fract: 3.4 % (ref 3.00–10.60)
LYMPH%: 29.5 % (ref 14.0–49.0)
MCH: 29.7 pg (ref 27.2–33.4)
MCHC: 33.7 g/dL (ref 32.0–36.0)
MCV: 88 fL (ref 79.3–98.0)
MONO#: 0.6 10*3/uL (ref 0.1–0.9)
MONO%: 8.3 % (ref 0.0–14.0)
NEUT#: 4.4 10*3/uL (ref 1.5–6.5)
NEUT%: 60.3 % (ref 39.0–75.0)
Platelets: 199 10*3/uL (ref 140–400)
RBC: 4.68 10*6/uL (ref 4.20–5.82)
RDW: 13.8 % (ref 11.0–14.6)
Retic %: 1.39 % (ref 0.80–1.80)
Retic Ct Abs: 65.05 10*3/uL (ref 34.80–93.90)
WBC: 7.4 10*3/uL (ref 4.0–10.3)
lymph#: 2.2 10*3/uL (ref 0.9–3.3)

## 2013-12-08 LAB — BASIC METABOLIC PANEL (CC13)
Anion Gap: 13 mEq/L — ABNORMAL HIGH (ref 3–11)
BUN: 12.7 mg/dL (ref 7.0–26.0)
CO2: 25 mEq/L (ref 22–29)
Calcium: 9.8 mg/dL (ref 8.4–10.4)
Chloride: 104 mEq/L (ref 98–109)
Creatinine: 1 mg/dL (ref 0.7–1.3)
Glucose: 211 mg/dl — ABNORMAL HIGH (ref 70–140)
Potassium: 3.6 mEq/L (ref 3.5–5.1)
Sodium: 142 mEq/L (ref 136–145)

## 2013-12-08 LAB — PROTIME-INR
INR: 3 (ref 2.00–3.50)
Protime: 36 Seconds — ABNORMAL HIGH (ref 10.6–13.4)

## 2013-12-08 LAB — POCT INR: INR: 3

## 2013-12-08 MED ORDER — WARFARIN SODIUM 5 MG PO TABS: 5.0000 mg | ORAL_TABLET | Freq: Every day | ORAL | Status: DC

## 2013-12-08 NOTE — Progress Notes (Signed)
[name] OFFICE PROGRESS NOTE  Bobby Lopez,Bobby A, MD DIAGNOSIS:  Recurrent DVT, recent hematuria  SUMMARY OF HEMATOLOGIC HISTORY: This patient was seen here for diagnosis of recurrent DVT. In 1987, he had provoked DVT due to immobilization, dehydration and severe abdominal infection. On 10/19/2013, the patient had laparoscopic repair of ventral hernia complicated by postoperative nausea, vomiting, dehydration and recurrent admission. He was subsequently diagnosed with left lower extremity DVT on 11/02/2013. The patient developed significant skin itching withXarelto and had significant hematuria due to bladder stone. On 11/14/2013, anticoagulation therapy was switched to warfarin. INTERVAL HISTORY: Bobby Lopez 73 y.o. male returns for further followup. I have discontinued all his vitamin supplementation. Since then, his bleeding has stopped. His leg swelling has improved. He has no other bleeding complications. His skin itching has resolved.  I have reviewed the past medical history, past surgical history, social history and family history with the patient and they are unchanged from previous note.  ALLERGIES:  is allergic to ace inhibitors and hydrocodone.  MEDICATIONS:  Current Outpatient Prescriptions  Medication Sig Dispense Refill  . amLODipine (NORVASC) 5 MG tablet Take 5 mg by mouth daily.      Marland Kitchen amLODipine (NORVASC) 5 MG tablet TAKE 1 TABLET (5 MG TOTAL) BY MOUTH DAILY.  90 tablet  0  . atorvastatin (LIPITOR) 20 MG tablet TAKE 1 TABLET (20 MG TOTAL) BY MOUTH DAILY.  90 tablet  0  . folic acid (FOLVITE) 106 MCG tablet Take 1 mg by mouth daily.      Marland Kitchen losartan-hydrochlorothiazide (HYZAAR) 50-12.5 MG per tablet Take 1 tablet by mouth daily.      . metFORMIN (GLUCOPHAGE) 500 MG tablet TAKE 1 TABLET BY MOUTH 2 TIMES DAILY WITH Lopez MEAL.  60 tablet  4  . Probiotic Product (ALIGN PO) Take 1 tablet by mouth daily.       Marland Kitchen warfarin (COUMADIN) 5 MG tablet Take 1 tablet (5  mg total) by mouth daily.  30 tablet  3   Current Facility-Administered Medications  Medication Dose Route Frequency Provider Last Rate Last Dose  . pneumococcal 23 valent vaccine (PNU-IMMUNE) injection 0.5 mL  0.5 mL Intramuscular Once Laurey Morale, MD         REVIEW OF SYSTEMS:   All other systems were reviewed with the patient and are negative.  PHYSICAL EXAMINATION: ECOG PERFORMANCE STATUS: 0 - Asymptomatic  Filed Vitals:   12/08/13 0943  BP: 122/63  Pulse: 84  Temp: 97.5 F (36.4 C)  Resp: 18   Filed Weights   12/08/13 0943  Weight: 201 lb 3.2 oz (91.264 kg)    GENERAL:alert, no distress and comfortable SKIN: skin color, texture, turgor are normal, no rashes or significant lesions Musculoskeletal:no cyanosis of digits and no clubbing  NEURO: alert & oriented x 3 with fluent speech, no focal motor/sensory deficits  LABORATORY DATA:  I have reviewed the data as listed Results for orders placed in visit on 12/08/13 (from the past 48 hour(s))  POCT INR     Status: None   Collection Time    12/08/13  9:41 AM      Result Value Ref Range   INR 3.0      Lab Results  Component Value Date   WBC 7.4 12/08/2013   HGB 13.9 12/08/2013   HCT 41.2 12/08/2013   MCV 88.0 12/08/2013   PLT 199 12/08/2013   ASSESSMENT & PLAN:  #1 recurrent DVT Both episodes of DVT were provoked. I will  like to anticoagulate him for minimum of 3 months. I will see him back at the end of May with repeat ultrasound venous Doppler to look for residual clots. If significant residual clots were seen, he will need bridging therapy prior to anticipated surgery on 02/02/2014. The patient needs to wait elastic compression hose. I given him prescription to use. I also encourage ambulation. #2 recurrent hematuria, resolved #3 bladder stone Since discontinuation of prior anticoagulation therapy and nutritional supplements, the patient had stopped bleeding. He is doing very well on warfarin and his INR is  therapeutic today. I recommend continue the same. I encouraged him to drink plenty of fluids. He is not anemic. All questions were answered. The patient knows to call the clinic with any problems, questions or concerns. No barriers to learning was detected.  I spent 25 minutes counseling the patient face to face. The total time spent in the appointment was 30 minutes and more than 50% was on counseling.     [place], [place], MD 12/08/2013 10:14 AM

## 2013-12-08 NOTE — Telephone Encounter (Signed)
gv adn pritned appt sched and avs for pt for May

## 2013-12-08 NOTE — Patient Instructions (Signed)
Continue 5mg  daily. Recheck INR on 12/29/13; Lab at 10:00am and Coumadin Clinic at 10:15am.

## 2013-12-08 NOTE — Progress Notes (Signed)
Pt seen in clinic prior to MD appmt with Dr. Alvy Bimler INR= 3.0 on 5mg  daily No new medications Diet remains consistent Continues his increased fluid intake Sent in [facility] RX to [facility] wife states last RX we sent never made it. Will to call to confirm receipt RTC on 12/29/13 at 10:00 for lab and 10:15 for CC

## 2013-12-29 ENCOUNTER — Ambulatory Visit (HOSPITAL_BASED_OUTPATIENT_CLINIC_OR_DEPARTMENT_OTHER): Payer: Medicare Other | Admitting: Pharmacist

## 2013-12-29 ENCOUNTER — Other Ambulatory Visit: Payer: Medicare Other

## 2013-12-29 DIAGNOSIS — I82409 Acute embolism and thrombosis of unspecified deep veins of unspecified lower extremity: Secondary | ICD-10-CM | POA: Diagnosis not present

## 2013-12-29 DIAGNOSIS — Z7901 Long term (current) use of anticoagulants: Secondary | ICD-10-CM

## 2013-12-29 LAB — PROTIME-INR
INR: 3.8 — ABNORMAL HIGH (ref 2.00–3.50)
Protime: 45.6 Seconds — ABNORMAL HIGH (ref 10.6–13.4)

## 2013-12-29 LAB — POCT INR: INR: 3.8

## 2013-12-29 NOTE — Patient Instructions (Signed)
Hold Coumadin today.  On 4/18, begin coumadin 5mg  daily except 2.5mg  on Wed & Sat.  Recheck INR on 01/12/14; Lab at 10:00am and Coumadin Clinic at 10:15am.

## 2013-12-29 NOTE — Progress Notes (Signed)
INR above goal today. INR steadily increasing since starting on coumadin 5mg  daily. No problems or concerns regarding anticoagulation. Pt does notice hematuria (briefly) when he walks > 1 mile at a time. No changes in medications. No missed coumadin doses. No changes in diet. Hold Coumadin today.  Will slightly reduce coumadin dose. On 4/18, begin coumadin 5mg  daily except 2.5mg  on Wed & Sat.  Recheck INR on 01/12/14; Lab at 10:00am and Coumadin Clinic at 10:15am.

## 2014-01-01 ENCOUNTER — Telehealth: Payer: Self-pay | Admitting: *Deleted

## 2014-01-01 NOTE — Telephone Encounter (Signed)
Instructed wife that 800 mcg is enough.  She verbalized understanding.

## 2014-01-01 NOTE — Telephone Encounter (Signed)
Wife states Dr. Alvy Bimler instructed pt to take 1 mg folic acid per day.  They were giving him 2 1/2 of 400 mcg tablets, but have found 800 mcg tablet.  Is it ok to give one 800 mcg with one 400 mcg to equal 1.2 mg?  This way they don't have to break any tabs in half.

## 2014-01-01 NOTE — Telephone Encounter (Signed)
800 mcg is close enough

## 2014-01-05 DIAGNOSIS — H905 Unspecified sensorineural hearing loss: Secondary | ICD-10-CM | POA: Diagnosis not present

## 2014-01-05 DIAGNOSIS — H6120 Impacted cerumen, unspecified ear: Secondary | ICD-10-CM | POA: Diagnosis not present

## 2014-01-10 ENCOUNTER — Other Ambulatory Visit: Payer: Self-pay | Admitting: Family Medicine

## 2014-01-12 ENCOUNTER — Other Ambulatory Visit (HOSPITAL_BASED_OUTPATIENT_CLINIC_OR_DEPARTMENT_OTHER): Payer: Medicare Other

## 2014-01-12 ENCOUNTER — Ambulatory Visit (HOSPITAL_BASED_OUTPATIENT_CLINIC_OR_DEPARTMENT_OTHER): Payer: Medicare Other | Admitting: Pharmacist

## 2014-01-12 ENCOUNTER — Telehealth: Payer: Self-pay | Admitting: *Deleted

## 2014-01-12 DIAGNOSIS — I82409 Acute embolism and thrombosis of unspecified deep veins of unspecified lower extremity: Secondary | ICD-10-CM

## 2014-01-12 LAB — PROTIME-INR
INR: 3.3 (ref 2.00–3.50)
Protime: 39.6 Seconds — ABNORMAL HIGH (ref 10.6–13.4)

## 2014-01-12 LAB — POCT INR: INR: 3.3

## 2014-01-12 NOTE — Progress Notes (Signed)
Pt seen in clinic today INR=3.3 on 5mg  daily with 2.5 mg on Wed and Sat This is down from 3.8 last visit No changes to report He has a f/u appmt with MD on 5/15 to discuss u/s results and need for surgery We will see him in clinic after this appmt with Dr. Alvy Bimler Coumadin 5mg  daily except 2.5mg  on Mon, Wed & Fri.  Recheck INR on 01/26/14; Lab at 10:30am, MD at 11:00am and Coumadin Clinic at 11:30am

## 2014-01-12 NOTE — Telephone Encounter (Signed)
Wife called to confirm time of appt w/ Dr. Alvy Bimler on 5/15.  Informed her it is at 11 am. She verbalized understanding.

## 2014-01-12 NOTE — Patient Instructions (Signed)
Coumadin 5mg  daily except 2.5mg  on Mon, Wed & Fri. Recheck INR on 01/26/14; Lab at 10:30am, MD at 11:00am and Coumadin Clinic at 11:30am

## 2014-01-17 ENCOUNTER — Telehealth: Payer: Self-pay | Admitting: *Deleted

## 2014-01-17 NOTE — Telephone Encounter (Signed)
Stop warfarin today for oral surgery on Monday, resume after surgery

## 2014-01-22 ENCOUNTER — Ambulatory Visit (HOSPITAL_COMMUNITY)
Admission: RE | Admit: 2014-01-22 | Discharge: 2014-01-22 | Disposition: A | Discharge status: Home / Self Care | Payer: Medicare Other | Source: Ambulatory Visit | Attending: Hematology and Oncology | Admitting: Hematology and Oncology

## 2014-01-22 ENCOUNTER — Telehealth: Payer: Self-pay | Admitting: *Deleted

## 2014-01-22 ENCOUNTER — Other Ambulatory Visit (HOSPITAL_COMMUNITY): Payer: Self-pay | Admitting: Hematology and Oncology

## 2014-01-22 DIAGNOSIS — I824Y9 Acute embolism and thrombosis of unspecified deep veins of unspecified proximal lower extremity: Secondary | ICD-10-CM | POA: Diagnosis not present

## 2014-01-22 DIAGNOSIS — I82409 Acute embolism and thrombosis of unspecified deep veins of unspecified lower extremity: Secondary | ICD-10-CM

## 2014-01-22 DIAGNOSIS — O2230 Deep phlebothrombosis in pregnancy, unspecified trimester: Secondary | ICD-10-CM

## 2014-01-22 NOTE — Telephone Encounter (Signed)
OK 

## 2014-01-22 NOTE — Progress Notes (Addendum)
Bilateral lower extremity venous duplex completed.  Right:  No evidence of DVT, superficial thrombosis, or Baker's cyst.  Left: DVT noted in the common femoral, femoral, popliteal, and posterior tibial veins.  No evidence of superficial thrombosis.  No Baker's cyst.

## 2014-01-22 NOTE — Telephone Encounter (Signed)
Call from Vascular Lab reports DVT in pt's left common femoral vein is unchanged from Feb this year.  No new DVTs found.

## 2014-01-26 ENCOUNTER — Ambulatory Visit (HOSPITAL_BASED_OUTPATIENT_CLINIC_OR_DEPARTMENT_OTHER): Payer: Medicare Other | Admitting: Pharmacist

## 2014-01-26 ENCOUNTER — Ambulatory Visit (HOSPITAL_BASED_OUTPATIENT_CLINIC_OR_DEPARTMENT_OTHER): Payer: Medicare Other | Admitting: Hematology and Oncology

## 2014-01-26 ENCOUNTER — Telehealth: Payer: Self-pay | Admitting: Hematology and Oncology

## 2014-01-26 ENCOUNTER — Other Ambulatory Visit (HOSPITAL_BASED_OUTPATIENT_CLINIC_OR_DEPARTMENT_OTHER): Payer: Medicare Other

## 2014-01-26 VITALS — BP 121/67 | HR 66 | Temp 97.6°F | Resp 18 | Ht 72.0 in | Wt 200.9 lb

## 2014-01-26 DIAGNOSIS — I82509 Chronic embolism and thrombosis of unspecified deep veins of unspecified lower extremity: Secondary | ICD-10-CM

## 2014-01-26 DIAGNOSIS — Z7901 Long term (current) use of anticoagulants: Secondary | ICD-10-CM | POA: Diagnosis not present

## 2014-01-26 DIAGNOSIS — N210 Calculus in bladder: Secondary | ICD-10-CM

## 2014-01-26 DIAGNOSIS — R319 Hematuria, unspecified: Secondary | ICD-10-CM | POA: Diagnosis not present

## 2014-01-26 DIAGNOSIS — I82409 Acute embolism and thrombosis of unspecified deep veins of unspecified lower extremity: Secondary | ICD-10-CM

## 2014-01-26 LAB — POCT INR: INR: 1.4

## 2014-01-26 LAB — PROTIME-INR
INR: 1.4 — ABNORMAL LOW (ref 2.00–3.50)
Protime: 16.8 Seconds — ABNORMAL HIGH (ref 10.6–13.4)

## 2014-01-26 NOTE — Patient Instructions (Signed)
INR below goal Take 1 whole tablet today then continue Coumadin 5mg  daily except 2.5mg  on Mon, Wed & Fri.  Recheck INR on 02/09/14; Lab at 10:30am, Coumadin clinic at 10:45am

## 2014-01-26 NOTE — Progress Notes (Signed)
INR below goal Pt is doing well today Pt seen by Dr. Alvy Bimler Surgery now postponed for 3 months Pt had dental procedure on 5/11 and was off coumadin for 4 days. He restarted on 5/11 and this is likely why the INR is low No unusual bleeding or bruising No medication or diet changes Plan: Take 1 whole tablet today (5 mg total) then continue Coumadin 5mg  daily except 2.5mg  on Mon, Wed & Fri.  Recheck INR on 02/09/14; Lab at 10:30am, Coumadin clinic at 10:45am

## 2014-01-26 NOTE — Progress Notes (Signed)
[place] OFFICE PROGRESS NOTE  FRY,STEPHEN A, MD DIAGNOSIS:  Recurrent, provoked, left lower extremity DVT  SUMMARY OF HEMATOLOGIC HISTORY: This patient was seen here for diagnosis of recurrent DVT. In 1987, he had provoked DVT due to immobilization, dehydration and severe abdominal infection. He cannot remember which leg was affected. On 10/19/2013, the patient had laparoscopic repair of ventral hernia complicated by postoperative nausea, vomiting, dehydration and recurrent admission. He was subsequently diagnosed with left lower extremity DVT on 11/02/2013. The patient developed significant skin itching withXarelto and had significant hematuria due to bladder stone. On 11/14/2013, anticoagulation therapy was switched to warfarin. INTERVAL HISTORY: Bobby Lopez 73 y.o. male returns for further followup. The patient has plans extraction of a bladder stone next week. In the meantime, he continued to have intermittent hematuria. Denies other bleeding complications such as epistaxis, hematochezia or hemoptysis. He said he has persistent leg swelling but he is compliant with wearing elastic compression hose.  I have reviewed the past medical history, past surgical history, social history and family history with the patient and they are unchanged from previous note.  ALLERGIES:  is allergic to ace inhibitors and hydrocodone.  MEDICATIONS:  Current Outpatient Prescriptions  Medication Sig Dispense Refill  . amLODipine (NORVASC) 5 MG tablet TAKE 1 TABLET (5 MG TOTAL) BY MOUTH DAILY.  90 tablet  0  . atorvastatin (LIPITOR) 20 MG tablet TAKE 1 TABLET (20 MG TOTAL) BY MOUTH DAILY.  90 tablet  0  . folic acid (FOLVITE) 425 MCG tablet Take 1 mg by mouth daily.      Marland Kitchen losartan-hydrochlorothiazide (HYZAAR) 50-12.5 MG per tablet Take 1 tablet by mouth daily.      . metFORMIN (GLUCOPHAGE) 500 MG tablet TAKE 1 TABLET BY MOUTH 2 TIMES DAILY WITH A MEAL.  60 tablet  4  .  oxyCODONE-acetaminophen (PERCOCET/ROXICET) 5-325 MG per tablet       . Probiotic Product (ALIGN PO) Take 1 tablet by mouth daily.       Marland Kitchen warfarin (COUMADIN) 5 MG tablet Take 1 tablet (5 mg total) by mouth daily.  30 tablet  3   Current Facility-Administered Medications  Medication Dose Route Frequency Provider Last Rate Last Dose  . pneumococcal 23 valent vaccine (PNU-IMMUNE) injection 0.5 mL  0.5 mL Intramuscular Once Laurey Morale, MD         REVIEW OF SYSTEMS:   Constitutional: Denies fevers, chills or night sweats Eyes: Denies blurriness of vision Ears, nose, mouth, throat, and face: Denies mucositis or sore throat Respiratory: Denies cough, dyspnea or wheezes Cardiovascular: Denies palpitation, chest discomfort or lower extremity swelling Gastrointestinal:  Denies nausea, heartburn or change in bowel habits Skin: Denies abnormal skin rashes Lymphatics: Denies new lymphadenopathy or easy bruising Neurological:Denies numbness, tingling or new weaknesses Behavioral/Psych: Mood is stable, no new changes  All other systems were reviewed with the patient and are negative.  PHYSICAL EXAMINATION: ECOG PERFORMANCE STATUS: 0 - Asymptomatic  Filed Vitals:   01/26/14 1047  BP: 121/67  Pulse: 66  Temp: 97.6 F (36.4 C)  Resp: 18   Filed Weights   01/26/14 1047  Weight: 200 lb 14.4 oz (91.128 kg)    GENERAL:alert, no distress and comfortable SKIN: skin color, texture, turgor are normal, no rashes or significant lesions EYES: normal, Conjunctiva are pink and non-injected, sclera clear OROPHARYNX:no exudate, no erythema and lips, buccal mucosa, and tongue normal  NECK: supple, thyroid normal size, non-tender, without nodularity LYMPH:  no palpable lymphadenopathy in  the cervical, axillary or inguinal LUNGS: clear to auscultation and percussion with normal breathing effort HEART: regular rate & rhythm and no murmurs with mild left lower extremity edema ABDOMEN:abdomen soft,  non-tender and normal bowel sounds Musculoskeletal:no cyanosis of digits and no clubbing  NEURO: alert & oriented x 3 with fluent speech, no focal motor/sensory deficits  LABORATORY DATA:  I have reviewed the data as listed Results for orders placed in visit on 01/26/14 (from the past 48 hour(s))  PROTIME-INR     Status: Abnormal   Collection Time    01/26/14 10:35 AM      Result Value Ref Range   Protime 16.8 (*) 10.6 - 13.4 Seconds   INR 1.40 (*) 2.00 - 3.50   Comment: INR is useful only to assess adequacy of anticoagulation with coumadin when comparing results from different labs. It should not be used to estimate bleeding risk or presence/abscense of coagulopathy in patients not on coumadin. Expected INR ranges for      nontherapeutic patients is 0.88 - 1.12.   Lovenox No      Lab Results  Component Value Date   WBC 7.4 12/08/2013   HGB 13.9 12/08/2013   HCT 41.2 12/08/2013   MCV 88.0 12/08/2013   PLT 199 12/08/2013   ASSESSMENT & PLAN:  #1 recurrent DVT #2 recurrent hematuria to 2 presence of a bladder stone.   Both episodes of DVT were provoked. I plan to anticoagulate him for 6 months this time rather than lifelong. The patient is compliant with wearing elastic compression hose. Repeat ultrasound venous Doppler showed residual clots in the left lower extremity. I discussed with him bridging therapy around his planned surgery next week. His wife is very concerned and she wants to hold off and defer surgery until he completes his anticoagulation treatment which I think is reasonable. I attempted to call his urologist but unable to get all of him. I left a message. I plan to continue warfarin for total of 6 months from February 2015. I recommend repeat left lower extremity ultrasound before discontinuation. Even if the repeat ultrasound show residual clots, our plan is for 6 months of anticoagulation treatment only. The imaging study in August will serve as a new baseline.  All  questions were answered. The patient knows to call the clinic with any problems, questions or concerns. No barriers to learning was detected.  I spent 30 minutes counseling the patient face to face. The total time spent in the appointment was 40 minutes and more than 50% was on counseling.     Heath Lark, MD 01/26/2014 2:38 PM

## 2014-01-26 NOTE — Telephone Encounter (Signed)
gv and printed appt sched and avs for tpf ro Aug °

## 2014-01-29 ENCOUNTER — Telehealth: Payer: Self-pay | Admitting: Family Medicine

## 2014-01-29 DIAGNOSIS — E119 Type 2 diabetes mellitus without complications: Secondary | ICD-10-CM

## 2014-01-29 NOTE — Telephone Encounter (Signed)
Orders placed.

## 2014-01-31 ENCOUNTER — Telehealth: Payer: Self-pay | Admitting: *Deleted

## 2014-01-31 NOTE — Telephone Encounter (Signed)
Wife left message states they got a call from [facility] pt is scheduled for surgery.  She thought Dr. Alvy Bimler told Dr. Louis Meckel that pt should not have surgery due to a blood clot?

## 2014-01-31 NOTE — Telephone Encounter (Signed)
I did talk to Dr. Herrick the same day after I met him. They need to clarify with [facility] because he may have forgotten to cancel the surgery 

## 2014-01-31 NOTE — Telephone Encounter (Signed)
Informed wife of Dr. Calton Dach reply below.  She verbalized understanding and states she is waiting for return call from [facility].

## 2014-02-07 ENCOUNTER — Other Ambulatory Visit: Payer: Self-pay | Admitting: Family Medicine

## 2014-02-09 ENCOUNTER — Ambulatory Visit (HOSPITAL_BASED_OUTPATIENT_CLINIC_OR_DEPARTMENT_OTHER): Payer: Medicare Other | Admitting: Pharmacist

## 2014-02-09 ENCOUNTER — Other Ambulatory Visit (HOSPITAL_BASED_OUTPATIENT_CLINIC_OR_DEPARTMENT_OTHER): Payer: Medicare Other

## 2014-02-09 DIAGNOSIS — I82409 Acute embolism and thrombosis of unspecified deep veins of unspecified lower extremity: Secondary | ICD-10-CM

## 2014-02-09 LAB — PROTIME-INR
INR: 1.8 — ABNORMAL LOW (ref 2.00–3.50)
Protime: 21.6 Seconds — ABNORMAL HIGH (ref 10.6–13.4)

## 2014-02-09 LAB — POCT INR: INR: 1.8

## 2014-02-09 NOTE — Progress Notes (Signed)
INR below goal (1.8) on Coumadin 5mg  daily except 2.5mg  on MWF Pt is doing well today  Pt notes due to his last venous duplex, he will continue on Coumadin (has repeat scan in August) No unusual bleeding or bruising  No medication or diet changes  Plan: Begin taking Coumadin 5mg  daily except 2.5mg  on Mon and Thurs. Recheck INR on 02/23/14; Lab at 10:30am, Coumadin clinic at 10:45am  Dicky Doe, PharmD, BCPS Clinical Pharmacist 02/09/2014 2:37 PM

## 2014-02-09 NOTE — Patient Instructions (Signed)
Begin taking Coumadin 5mg  daily except 2.5mg  on Mon and Thurs. Recheck INR on 02/23/14; Lab at 10:30am, Coumadin clinic at 10:45am

## 2014-02-16 ENCOUNTER — Other Ambulatory Visit (INDEPENDENT_AMBULATORY_CARE_PROVIDER_SITE_OTHER): Payer: Medicare Other

## 2014-02-16 DIAGNOSIS — E119 Type 2 diabetes mellitus without complications: Secondary | ICD-10-CM

## 2014-02-16 LAB — HEPATIC FUNCTION PANEL
ALT: 15 U/L (ref 0–53)
AST: 18 U/L (ref 0–37)
Albumin: 4 g/dL (ref 3.5–5.2)
Alkaline Phosphatase: 49 U/L (ref 39–117)
Bilirubin, Direct: 0.1 mg/dL (ref 0.0–0.3)
Total Bilirubin: 0.7 mg/dL (ref 0.2–1.2)
Total Protein: 7.3 g/dL (ref 6.0–8.3)

## 2014-02-16 LAB — HEMOGLOBIN A1C: Hgb A1c MFr Bld: 6.7 % — ABNORMAL HIGH (ref 4.6–6.5)

## 2014-02-16 LAB — LIPID PANEL
Cholesterol: 121 mg/dL (ref 0–200)
HDL: 32.8 mg/dL — ABNORMAL LOW (ref >39.00)
LDL Cholesterol: 55 mg/dL (ref 0–99)
NonHDL: 88.2
Total CHOL/HDL Ratio: 4
Triglycerides: 164 mg/dL — ABNORMAL HIGH (ref 0.0–149.0)
VLDL: 32.8 mg/dL (ref 0.0–40.0)

## 2014-02-19 ENCOUNTER — Ambulatory Visit (INDEPENDENT_AMBULATORY_CARE_PROVIDER_SITE_OTHER): Payer: Medicare Other | Admitting: Family Medicine

## 2014-02-19 ENCOUNTER — Encounter: Payer: Self-pay | Admitting: Family Medicine

## 2014-02-19 VITALS — BP 101/64 | HR 69 | Temp 99.0°F | Ht 72.0 in | Wt 202.0 lb

## 2014-02-19 DIAGNOSIS — I82409 Acute embolism and thrombosis of unspecified deep veins of unspecified lower extremity: Secondary | ICD-10-CM

## 2014-02-19 DIAGNOSIS — E785 Hyperlipidemia, unspecified: Secondary | ICD-10-CM | POA: Diagnosis not present

## 2014-02-19 DIAGNOSIS — E119 Type 2 diabetes mellitus without complications: Secondary | ICD-10-CM | POA: Diagnosis not present

## 2014-02-19 DIAGNOSIS — I10 Essential (primary) hypertension: Secondary | ICD-10-CM | POA: Diagnosis not present

## 2014-02-19 NOTE — Progress Notes (Signed)
Pre visit review using our clinic review tool, if applicable. No additional management support is needed unless otherwise documented below in the visit note. 

## 2014-02-19 NOTE — Progress Notes (Signed)
   Subjective:    Patient ID: Bobby Lopez, male    DOB: 04/21/1941, 73 y.o.   MRN: [id number]  HPI Here to follow up. He feels well. He is closely followed by Hematology for Coumadin checks.    Review of Systems  Constitutional: Negative.   Respiratory: Negative.        Objective:   Physical Exam  Constitutional: He appears well-developed and well-nourished.  Cardiovascular: Normal rate, regular rhythm, normal heart sounds and intact distal pulses.   Pulmonary/Chest: Effort normal and breath sounds normal.          Assessment & Plan:  Doing well. recheck in 6 months

## 2014-02-20 ENCOUNTER — Telehealth: Payer: Self-pay | Admitting: Family Medicine

## 2014-02-20 NOTE — Telephone Encounter (Signed)
Relevant patient education assigned to patient using Emmi. ° °

## 2014-02-23 ENCOUNTER — Ambulatory Visit (HOSPITAL_BASED_OUTPATIENT_CLINIC_OR_DEPARTMENT_OTHER): Payer: Medicare Other | Admitting: Pharmacist

## 2014-02-23 ENCOUNTER — Other Ambulatory Visit (HOSPITAL_BASED_OUTPATIENT_CLINIC_OR_DEPARTMENT_OTHER): Payer: Medicare Other

## 2014-02-23 DIAGNOSIS — I82409 Acute embolism and thrombosis of unspecified deep veins of unspecified lower extremity: Secondary | ICD-10-CM

## 2014-02-23 LAB — POCT INR: INR: 2.6

## 2014-02-23 LAB — PROTIME-INR
INR: 2.6 (ref 2.00–3.50)
Protime: 31.2 Seconds — ABNORMAL HIGH (ref 10.6–13.4)

## 2014-02-23 MED ORDER — WARFARIN SODIUM 5 MG PO TABS: ORAL_TABLET | ORAL | Status: DC

## 2014-02-23 NOTE — Progress Notes (Signed)
INR at goal Pt is doing well with no complaints No unusual bleeding or bruising No missed or extra doses No medication or diet changes Pt states his appetite is very good and he has put on a little weight and would like to begin exercising/walking more but is limited due to the bladder stones INR has returned to goal after slight dose increase two weeks ago Will continue same dose Pt will be going on vacation July 15th for about a week or two. We will see him just before his trip Plan: No changes Continue taking Coumadin 5mg  daily except 2.5mg  on Mon and Thurs.  Recheck INR in 3 weeks on 03/19/14; Lab at 10:30am, Coumadin clinic at 10:45am

## 2014-02-23 NOTE — Patient Instructions (Signed)
INR at goal No changes Continue taking Coumadin 5mg  daily except 2.5mg  on Mon and Thurs.  Recheck INR on 03/19/14; Lab at 10:30am, Coumadin clinic at 10:45am

## 2014-02-24 ENCOUNTER — Other Ambulatory Visit: Payer: Self-pay | Admitting: Family Medicine

## 2014-03-19 ENCOUNTER — Ambulatory Visit (HOSPITAL_BASED_OUTPATIENT_CLINIC_OR_DEPARTMENT_OTHER): Payer: Medicare Other | Admitting: Pharmacist

## 2014-03-19 ENCOUNTER — Other Ambulatory Visit (HOSPITAL_BASED_OUTPATIENT_CLINIC_OR_DEPARTMENT_OTHER): Payer: Medicare Other

## 2014-03-19 DIAGNOSIS — I82409 Acute embolism and thrombosis of unspecified deep veins of unspecified lower extremity: Secondary | ICD-10-CM

## 2014-03-19 LAB — PROTIME-INR
INR: 3.7 — ABNORMAL HIGH (ref 2.00–3.50)
Protime: 44.4 Seconds — ABNORMAL HIGH (ref 10.6–13.4)

## 2014-03-19 LAB — POCT INR: INR: 3.7

## 2014-03-19 NOTE — Progress Notes (Signed)
Pt seen in clinic today INR=3.7 Continues to rise with slight changes in dose No other changes to report Hold today's dose then continue taking Coumadin 5mg  daily except 2.5mg  on Mon and Thurs.  Recheck INR on 03/27/14; Lab at 3:45pm and Coumadin clinic at 4:00pm

## 2014-03-19 NOTE — Patient Instructions (Signed)
Hold today's dose then continue taking Coumadin 5mg  daily except 2.5mg  on Mon and Thurs. Recheck INR on 03/27/14; Lab at 3:45pm and Coumadin clinic at 4:00pm

## 2014-03-27 ENCOUNTER — Other Ambulatory Visit (HOSPITAL_BASED_OUTPATIENT_CLINIC_OR_DEPARTMENT_OTHER): Payer: Medicare Other

## 2014-03-27 ENCOUNTER — Ambulatory Visit (HOSPITAL_BASED_OUTPATIENT_CLINIC_OR_DEPARTMENT_OTHER): Payer: Medicare Other | Admitting: Pharmacist

## 2014-03-27 DIAGNOSIS — I82409 Acute embolism and thrombosis of unspecified deep veins of unspecified lower extremity: Secondary | ICD-10-CM | POA: Diagnosis not present

## 2014-03-27 LAB — POCT INR: INR: 3.2

## 2014-03-27 LAB — PROTIME-INR
INR: 3.2 (ref 2.00–3.50)
Protime: 38.4 Seconds — ABNORMAL HIGH (ref 10.6–13.4)

## 2014-03-27 NOTE — Progress Notes (Signed)
INR = 3.2  Goal 2-3 INR is slightly above goal range. No complications of anticoagulation noted. No medication or dietary changes. The patient and his wife are leaving for vacation on Thursday. They are driving to the [place] in [place] and plan to stop every 2 hours to stretch and walk around. He is aware that it is important to ambulate periodically while traveling. He will continue taking Coumadin 5mg  daily except 2.5mg  on Mon and Thurs. We will recheck INR on 04/13/14; lab at 10:30 am and Coumadin clinic at 10:45 am.  Theone Murdoch, PharmD

## 2014-04-13 ENCOUNTER — Ambulatory Visit (HOSPITAL_BASED_OUTPATIENT_CLINIC_OR_DEPARTMENT_OTHER): Payer: Medicare Other | Admitting: Pharmacist

## 2014-04-13 ENCOUNTER — Other Ambulatory Visit (HOSPITAL_BASED_OUTPATIENT_CLINIC_OR_DEPARTMENT_OTHER): Payer: Medicare Other

## 2014-04-13 DIAGNOSIS — I82409 Acute embolism and thrombosis of unspecified deep veins of unspecified lower extremity: Secondary | ICD-10-CM

## 2014-04-13 DIAGNOSIS — I82402 Acute embolism and thrombosis of unspecified deep veins of left lower extremity: Secondary | ICD-10-CM

## 2014-04-13 LAB — PROTIME-INR
INR: 3.6 — ABNORMAL HIGH (ref 2.00–3.50)
Protime: 43.2 Seconds — ABNORMAL HIGH (ref 10.6–13.4)

## 2014-04-13 LAB — POCT INR: INR: 3.6

## 2014-04-13 NOTE — Patient Instructions (Signed)
Hold coumadin today (Do not take the 5mg  today).   On 04/14/14, decrease coumadin to 5mg  daily except 2.5mg  on MWF.  Recheck INR on 04/20/14; Lab at 10:30am and Coumadin clinic at 10:45am

## 2014-04-13 NOTE — Progress Notes (Signed)
INR above goal today. No missed or extra coumadin doses. No significant changes in diet.  He has had more sour cream and iceberg lettuce lately which should not effect his INR.  No changes in medications. No unusual bruising or bleeding noted. No s/s of clotting noted. Pt states that he notices that his lower legs are red in the mornings - not warm, painful or swollen. It goes away. No problems or concerns regarding anticoagulation. Pt and wife had a nice time on their trip. INR elevated x 2 visits.  Will slightly decrease dose. Hold coumadin today (Do not take the 5mg  today).   On 04/14/14, decrease coumadin to 5mg  daily except 2.5mg  on MWF.  Recheck INR on 04/20/14; Lab at 10:30am and Coumadin clinic at 10:45am

## 2014-04-17 DIAGNOSIS — M5126 Other intervertebral disc displacement, lumbar region: Secondary | ICD-10-CM | POA: Diagnosis not present

## 2014-04-20 ENCOUNTER — Other Ambulatory Visit (HOSPITAL_BASED_OUTPATIENT_CLINIC_OR_DEPARTMENT_OTHER): Payer: Medicare Other

## 2014-04-20 ENCOUNTER — Ambulatory Visit (HOSPITAL_BASED_OUTPATIENT_CLINIC_OR_DEPARTMENT_OTHER): Payer: Medicare Other | Admitting: Pharmacist

## 2014-04-20 DIAGNOSIS — M5126 Other intervertebral disc displacement, lumbar region: Secondary | ICD-10-CM | POA: Diagnosis not present

## 2014-04-20 DIAGNOSIS — I82409 Acute embolism and thrombosis of unspecified deep veins of unspecified lower extremity: Secondary | ICD-10-CM

## 2014-04-20 LAB — PROTIME-INR
INR: 2.2 (ref 2.00–3.50)
Protime: 26.4 Seconds — ABNORMAL HIGH (ref 10.6–13.4)

## 2014-04-20 LAB — POCT INR: INR: 2.2

## 2014-04-20 NOTE — Progress Notes (Signed)
INR = 2.2 after holding Coumadin x 1 dose last Fri (04/13/14) then decreasing dose to 5 mg daily except 2.5 mg on MWF. No bleeding/bruising when INR was above goal last week. Having MRI next week in anticipation of back surgery soon to repair possible herniated disc Kary Kos, MD w/ [facility]; [phone]). New med: Vicodin prn back pain. INR back in goal range.  Continue Coumadin 5 mg/day; 2.5 mg MWF. Return in 2 weeks.  He already has appt w/ Dr. Alvy Bimler same day (8/21) At pts wife, Pat's request, I have provided them w/ Dr. Calton Dach RN ph# so she can pass along to Dr. Windy Carina office in order to coordinate anticoag plan for back surgery. Sometime in the near future also, pt needs to have surgery to remove bladder stones. Kennith Center, Pharm.D., CPP 04/20/2014@11 :07 AM

## 2014-04-25 DIAGNOSIS — M5126 Other intervertebral disc displacement, lumbar region: Secondary | ICD-10-CM | POA: Diagnosis not present

## 2014-04-25 DIAGNOSIS — M48061 Spinal stenosis, lumbar region without neurogenic claudication: Secondary | ICD-10-CM | POA: Diagnosis not present

## 2014-04-30 ENCOUNTER — Other Ambulatory Visit: Payer: Self-pay | Admitting: *Deleted

## 2014-04-30 ENCOUNTER — Ambulatory Visit (HOSPITAL_COMMUNITY)
Admission: RE | Admit: 2014-04-30 | Discharge: 2014-04-30 | Disposition: A | Discharge status: Home / Self Care | Payer: Medicare Other | Source: Ambulatory Visit | Attending: Hematology and Oncology | Admitting: Hematology and Oncology

## 2014-04-30 DIAGNOSIS — I82409 Acute embolism and thrombosis of unspecified deep veins of unspecified lower extremity: Secondary | ICD-10-CM | POA: Insufficient documentation

## 2014-04-30 DIAGNOSIS — Z86718 Personal history of other venous thrombosis and embolism: Secondary | ICD-10-CM

## 2014-04-30 DIAGNOSIS — I82402 Acute embolism and thrombosis of unspecified deep veins of left lower extremity: Secondary | ICD-10-CM

## 2014-04-30 NOTE — Progress Notes (Signed)
VASCULAR LAB PRELIMINARY  PRELIMINARY  PRELIMINARY  PRELIMINARY  Left lower extremity venous duplex completed.    Preliminary report:  Left:  DVT remains in the femoral vein.  It has resolved in the CFV and Pop v.    Dylin Ihnen, RVT 04/30/2014, 12:06 PM

## 2014-05-01 DIAGNOSIS — Z6827 Body mass index (BMI) 27.0-27.9, adult: Secondary | ICD-10-CM | POA: Diagnosis not present

## 2014-05-01 DIAGNOSIS — M5126 Other intervertebral disc displacement, lumbar region: Secondary | ICD-10-CM | POA: Diagnosis not present

## 2014-05-04 ENCOUNTER — Ambulatory Visit: Payer: Self-pay | Admitting: Pharmacist

## 2014-05-04 ENCOUNTER — Ambulatory Visit (HOSPITAL_BASED_OUTPATIENT_CLINIC_OR_DEPARTMENT_OTHER): Payer: Medicare Other | Admitting: Hematology and Oncology

## 2014-05-04 ENCOUNTER — Other Ambulatory Visit (HOSPITAL_BASED_OUTPATIENT_CLINIC_OR_DEPARTMENT_OTHER): Payer: Medicare Other

## 2014-05-04 ENCOUNTER — Encounter: Payer: Self-pay | Admitting: Hematology and Oncology

## 2014-05-04 VITALS — BP 107/66 | HR 65 | Temp 98.3°F | Resp 20 | Ht 72.0 in | Wt 205.4 lb

## 2014-05-04 DIAGNOSIS — I82409 Acute embolism and thrombosis of unspecified deep veins of unspecified lower extremity: Secondary | ICD-10-CM | POA: Diagnosis not present

## 2014-05-04 DIAGNOSIS — I82402 Acute embolism and thrombosis of unspecified deep veins of left lower extremity: Secondary | ICD-10-CM

## 2014-05-04 DIAGNOSIS — M1990 Unspecified osteoarthritis, unspecified site: Secondary | ICD-10-CM | POA: Insufficient documentation

## 2014-05-04 DIAGNOSIS — N210 Calculus in bladder: Secondary | ICD-10-CM

## 2014-05-04 LAB — PROTIME-INR
INR: 2.6 (ref 2.00–3.50)
Protime: 31.2 Seconds — ABNORMAL HIGH (ref 10.6–13.4)

## 2014-05-04 LAB — POCT INR: INR: 2.6

## 2014-05-04 NOTE — Assessment & Plan Note (Signed)
He has significant back pain requiring pain clinic management. According to his wife, he would require injection to the spine. I spoke with the neurosurgeon who had recommended him to pain clinic for injection. From my standpoint, I would recommend him to contact the specific clinic to see whether aspirin is contraindicated prior to injection. According to the neurosurgeon, typically aspirin is held for minimum of 5 days prior to injection but it is different according to different providers. As long as he does not require major surgery or impairment of mobility, he does not require DVT prophylaxis.

## 2014-05-04 NOTE — Assessment & Plan Note (Signed)
The patient had urology surgery pending. Again, there is no contraindication for him to proceed while on aspirin.

## 2014-05-04 NOTE — Assessment & Plan Note (Signed)
He has completed 6 months worth of anticoagulation treatment. I recommend he continues using elastic compression hose to prevent postphlebitic syndrome. I recommend discontinuation of warfarin and to switch him to aspirin 81 mg 2 tablets daily.

## 2014-05-04 NOTE — Patient Instructions (Signed)
Stop coumadin and begin Aspirin as instructed by Dr. Alvy Bimler.

## 2014-05-04 NOTE — Progress Notes (Signed)
[place] OFFICE PROGRESS NOTE  Laurey Morale, MD SUMMARY OF HEMATOLOGIC HISTORY: This patient was seen here for diagnosis of recurrent DVT. In 1987, he had provoked DVT due to immobilization, dehydration and severe abdominal infection. He cannot remember which leg was affected. On 10/19/2013, the patient had laparoscopic repair of ventral hernia complicated by postoperative nausea, vomiting, dehydration and recurrent admission. He was subsequently diagnosed with left lower extremity DVT on 11/02/2013. The patient developed significant skin itching withXarelto and had significant hematuria due to bladder stone. On 11/14/2013, anticoagulation therapy was switched to warfarin. On 05/04/2014, warfarin was discontinued and he was switched to aspirin 81 mg 2 tablets daily. INTERVAL HISTORY: ARASH KARSTENS 73 y.o. male returns for further followup. He complained of severe back pain requiring injection to his spine. He denies hematuria. The patient denies any recent signs or symptoms of bleeding such as spontaneous epistaxis, hematuria or hematochezia.   I have reviewed the past medical history, past surgical history, social history and family history with the patient and they are unchanged from previous note.  ALLERGIES:  is allergic to ace inhibitors and hydrocodone.  MEDICATIONS:  Current Outpatient Prescriptions  Medication Sig Dispense Refill  . amLODipine (NORVASC) 5 MG tablet TAKE 1 TABLET (5 MG TOTAL) BY MOUTH DAILY.  90 tablet  0  . atorvastatin (LIPITOR) 20 MG tablet TAKE 1 TABLET (20 MG TOTAL) BY MOUTH DAILY.  90 tablet  0  . folic acid (FOLVITE) 564 MCG tablet Take 800 mcg by mouth daily.      Marland Kitchen losartan-hydrochlorothiazide (HYZAAR) 50-12.5 MG per tablet Take 1 tablet by mouth daily.      . metFORMIN (GLUCOPHAGE) 500 MG tablet TAKE 1 TABLET BY MOUTH 2 TIMES DAILY WITH A MEAL.  60 tablet  3  . Probiotic Product (ALIGN PO) Take 1 tablet by mouth daily.       Marland Kitchen  warfarin (COUMADIN) 5 MG tablet Take 5 mg (1 tablet) daily by mouth Except on Mon & Thur take 2.5 mg (half tablet)  30 tablet  2  . HYDROcodone-acetaminophen (NORCO/VICODIN) 5-325 MG per tablet Take 1 tablet by mouth every 6 (six) hours as needed for moderate pain.       No current facility-administered medications for this visit.     REVIEW OF SYSTEMS:   Constitutional: Denies fevers, chills or night sweats Eyes: Denies blurriness of vision Ears, nose, mouth, throat, and face: Denies mucositis or sore throat Respiratory: Denies cough, dyspnea or wheezes Cardiovascular: Denies palpitation, chest discomfort or lower extremity swelling Gastrointestinal:  Denies nausea, heartburn or change in bowel habits Skin: Denies abnormal skin rashes Lymphatics: Denies new lymphadenopathy or easy bruising Neurological:Denies numbness, tingling or new weaknesses Behavioral/Psych: Mood is stable, no new changes  All other systems were reviewed with the patient and are negative.  PHYSICAL EXAMINATION: ECOG PERFORMANCE STATUS: 1 - Symptomatic but completely ambulatory  Filed Vitals:   05/04/14 1003  BP: 107/66  Pulse: 65  Temp: 98.3 F (36.8 C)  Resp: 20   Filed Weights   05/04/14 1003  Weight: 205 lb 6.4 oz (93.169 kg)    GENERAL:alert, no distress and comfortable SKIN: skin color, texture, turgor are normal, no rashes or significant lesions EYES: normal, Conjunctiva are pink and non-injected, sclera clear  Musculoskeletal:no cyanosis of digits and no clubbing  NEURO: alert & oriented x 3 with fluent speech, no focal motor/sensory deficits  LABORATORY DATA:  I have reviewed the data as listed Results for orders placed  in visit on 05/04/14 (from the past 48 hour(s))  PROTIME-INR     Status: Abnormal   Collection Time    05/04/14  9:29 AM      Result Value Ref Range   Protime 31.2 (*) 10.6 - 13.4 Seconds   INR 2.60  2.00 - 3.50   Comment: INR is useful only to assess adequacy of  anticoagulation with coumadin when comparing results from different labs. It should not be used to estimate bleeding risk or presence/abscense of coagulopathy in patients not on coumadin. Expected INR ranges for      nontherapeutic patients is 0.88 - 1.12.   Lovenox No      Lab Results  Component Value Date   WBC 7.4 12/08/2013   HGB 13.9 12/08/2013   HCT 41.2 12/08/2013   MCV 88.0 12/08/2013   PLT 199 12/08/2013    ASSESSMENT & PLAN:  Acute DVT (deep venous thrombosis) He has completed 6 months worth of anticoagulation treatment. I recommend he continues using elastic compression hose to prevent postphlebitic syndrome. I recommend discontinuation of warfarin and to switch him to aspirin 81 mg 2 tablets daily.  DJD (degenerative joint disease) He has significant back pain requiring pain clinic management. According to his wife, he would require injection to the spine. I spoke with the neurosurgeon who had recommended him to pain clinic for injection. From my standpoint, I would recommend him to contact the specific clinic to see whether aspirin is contraindicated prior to injection. According to the neurosurgeon, typically aspirin is held for minimum of 5 days prior to injection but it is different according to different providers. As long as he does not require major surgery or impairment of mobility, he does not require DVT prophylaxis.  Bladder stone The patient had urology surgery pending. Again, there is no contraindication for him to proceed while on aspirin.    All questions were answered. The patient knows to call the clinic with any problems, questions or concerns. No barriers to learning was detected.  I spent 25 minutes counseling the patient face to face. The total time spent in the appointment was 30 minutes and more than 50% was on counseling.     [facility], [place], MD 05/04/2014 3:55 PM

## 2014-05-04 NOTE — Progress Notes (Signed)
Stop coumadin and begin Aspirin as instructed by Dr. Alvy Bimler.

## 2014-05-06 ENCOUNTER — Other Ambulatory Visit: Payer: Self-pay | Admitting: Family Medicine

## 2014-05-07 ENCOUNTER — Telehealth: Payer: Self-pay | Admitting: Medical Oncology

## 2014-05-07 DIAGNOSIS — H6120 Impacted cerumen, unspecified ear: Secondary | ICD-10-CM | POA: Diagnosis not present

## 2014-05-07 NOTE — Telephone Encounter (Signed)
Yes, he can resume all other vitamins

## 2014-05-07 NOTE — Telephone Encounter (Signed)
VMOM: Wife called asking whether patient is to continue with folic acid and "other vitamins" since Dr. Alvy Bimler stopped coumadin? As well as asking if he is ok to "eat salad greens and special K?"  LOV 08/21  MD inboxed for review

## 2014-05-07 NOTE — Telephone Encounter (Signed)
Per MD, informed wife patient can resume all other vitamins and eat salads and can stop folic acid if he wishes. Wife expressed understanding knows to call office with further questions/concerns.

## 2014-05-20 ENCOUNTER — Other Ambulatory Visit: Payer: Self-pay | Admitting: Family Medicine

## 2014-05-20 ENCOUNTER — Other Ambulatory Visit: Payer: Self-pay | Admitting: Hematology and Oncology

## 2014-05-23 DIAGNOSIS — IMO0002 Reserved for concepts with insufficient information to code with codable children: Secondary | ICD-10-CM | POA: Diagnosis not present

## 2014-06-06 DIAGNOSIS — IMO0002 Reserved for concepts with insufficient information to code with codable children: Secondary | ICD-10-CM | POA: Diagnosis not present

## 2014-06-17 ENCOUNTER — Other Ambulatory Visit: Payer: Self-pay | Admitting: Family Medicine

## 2014-06-25 DIAGNOSIS — N138 Other obstructive and reflux uropathy: Secondary | ICD-10-CM | POA: Diagnosis not present

## 2014-06-25 DIAGNOSIS — N210 Calculus in bladder: Secondary | ICD-10-CM | POA: Diagnosis not present

## 2014-06-25 DIAGNOSIS — N401 Enlarged prostate with lower urinary tract symptoms: Secondary | ICD-10-CM | POA: Diagnosis not present

## 2014-06-27 ENCOUNTER — Other Ambulatory Visit: Payer: Self-pay | Admitting: Urology

## 2014-07-12 ENCOUNTER — Encounter (HOSPITAL_COMMUNITY): Payer: Self-pay | Admitting: Pharmacy Technician

## 2014-07-12 ENCOUNTER — Other Ambulatory Visit (HOSPITAL_COMMUNITY): Payer: Self-pay | Admitting: *Deleted

## 2014-07-12 NOTE — Patient Instructions (Addendum)
Bobby Lopez [facility]  07/12/2014                           YOUR PROCEDURE IS SCHEDULED ON:  07/20/14                ENTER FROM [address] - GO TO PARKING DECK               LOOK FOR VALET PARKING  / [employer]ARTS                              FOLLOW  SIGNS TO SHORT STAY CENTER                 ARRIVE AT SHORT STAY AT:  8:00 AM               CALL THIS NUMBER IF ANY PROBLEMS THE DAY OF SURGERY :               [phone]                                REMEMBER:   Do not eat food or drink liquids AFTER MIDNIGHT                  Take these medicines the morning of surgery with               A SIPS OF WATER :  AMLODIPINE       Do not wear jewelry, make-up   Do not wear lotions, powders, or perfumes.   Do not shave legs or underarms 12 hrs. before surgery (men may shave face)  Do not bring valuables to the hospital.  Contacts, dentures or bridgework may not be worn into surgery.  Leave suitcase in the car. After surgery it may be brought to your room.  For patients admitted to the hospital more than one night, checkout time is            11:00 AM                                            ________________________________________________________________________                                                                                                  [place]  Before surgery, you can play an important role.  Because skin is not sterile, your skin needs to be as free of germs as possible.  You can reduce the number of germs on your skin by washing with CHG (chlorahexidine gluconate) soap before surgery.  CHG is an antiseptic cleaner which kills germs and bonds with the skin to continue killing germs even after washing. Please DO NOT use if you have an allergy to CHG or antibacterial soaps.  If  your skin becomes reddened/irritated stop using the CHG and inform your nurse when you arrive at Short Stay. Do not shave (including legs and underarms)  for at least 48 hours prior to the first CHG shower.  You may shave your face. Please follow these instructions carefully:   1.  Shower with CHG Soap the night before surgery and the  morning of Surgery.   2.  If you choose to wash your hair, wash your hair first as usual with your  normal  Shampoo.   3.  After you shampoo, rinse your hair and body thoroughly to remove the  shampoo.                                         4.  Use CHG as you would any other liquid soap.  You can apply chg directly  to the skin and wash . Gently wash with scrungie or clean wascloth    5.  Apply the CHG Soap to your body ONLY FROM THE NECK DOWN.   Do not use on open                           Wound or open sores. Avoid contact with eyes, ears mouth and genitals (private parts).                        Genitals (private parts) with your normal soap.              6.  Wash thoroughly, paying special attention to the area where your surgery  will be performed.   7.  Thoroughly rinse your body with warm water from the neck down.   8.  DO NOT shower/wash with your normal soap after using and rinsing off  the CHG Soap .                9.  Pat yourself dry with a clean towel.             10.  Wear clean pajamas.             11.  Place clean sheets on your bed the night of your first shower and do not  sleep with pets.  Day of Surgery : Do not apply any lotions/deodorants the morning of surgery.  Please wear clean clothes to the hospital/surgery center.  FAILURE TO FOLLOW THESE INSTRUCTIONS MAY RESULT IN THE CANCELLATION OF YOUR SURGERY    PATIENT SIGNATURE_________________________________  ______________________________________________________________________

## 2014-07-13 ENCOUNTER — Encounter (HOSPITAL_COMMUNITY)
Admission: RE | Admit: 2014-07-13 | Discharge: 2014-07-13 | Disposition: A | Discharge status: Home / Self Care | Payer: Medicare Other | Source: Ambulatory Visit | Attending: Urology | Admitting: Urology

## 2014-07-13 ENCOUNTER — Ambulatory Visit (HOSPITAL_COMMUNITY)
Admission: RE | Admit: 2014-07-13 | Discharge: 2014-07-13 | Disposition: A | Discharge status: Home / Self Care | Payer: Medicare Other | Source: Ambulatory Visit | Attending: Anesthesiology | Admitting: Anesthesiology

## 2014-07-13 ENCOUNTER — Other Ambulatory Visit (HOSPITAL_COMMUNITY): Payer: Medicare Other

## 2014-07-13 ENCOUNTER — Encounter (HOSPITAL_COMMUNITY): Payer: Self-pay

## 2014-07-13 DIAGNOSIS — Z01818 Encounter for other preprocedural examination: Secondary | ICD-10-CM | POA: Diagnosis not present

## 2014-07-13 DIAGNOSIS — Z01811 Encounter for preprocedural respiratory examination: Secondary | ICD-10-CM | POA: Insufficient documentation

## 2014-07-13 DIAGNOSIS — I10 Essential (primary) hypertension: Secondary | ICD-10-CM | POA: Insufficient documentation

## 2014-07-13 DIAGNOSIS — Z01812 Encounter for preprocedural laboratory examination: Secondary | ICD-10-CM | POA: Diagnosis not present

## 2014-07-13 HISTORY — DX: Other intervertebral disc displacement, lumbar region: M51.26

## 2014-07-13 HISTORY — DX: Benign prostatic hyperplasia without lower urinary tract symptoms: N40.0

## 2014-07-13 LAB — BASIC METABOLIC PANEL
Anion gap: 10 (ref 5–15)
BUN: 17 mg/dL (ref 6–23)
CO2: 28 mEq/L (ref 19–32)
Calcium: 9.5 mg/dL (ref 8.4–10.5)
Chloride: 100 mEq/L (ref 96–112)
Creatinine, Ser: 1.01 mg/dL (ref 0.50–1.35)
GFR calc Af Amer: 83 mL/min — ABNORMAL LOW (ref >90)
GFR calc non Af Amer: 72 mL/min — ABNORMAL LOW (ref >90)
Glucose, Bld: 204 mg/dL — ABNORMAL HIGH (ref 70–99)
Potassium: 4.3 mEq/L (ref 3.7–5.3)
Sodium: 138 mEq/L (ref 137–147)

## 2014-07-13 LAB — CBC
HCT: 43.6 % (ref 39.0–52.0)
Hemoglobin: 15.1 g/dL (ref 13.0–17.0)
MCH: 30.9 pg (ref 26.0–34.0)
MCHC: 34.6 g/dL (ref 30.0–36.0)
MCV: 89.2 fL (ref 78.0–100.0)
Platelets: 210 10*3/uL (ref 150–400)
RBC: 4.89 MIL/uL (ref 4.22–5.81)
RDW: 12.9 % (ref 11.5–15.5)
WBC: 8.6 10*3/uL (ref 4.0–10.5)

## 2014-07-13 NOTE — Progress Notes (Signed)
07/13/14 1142  OBSTRUCTIVE SLEEP APNEA  Have you ever been diagnosed with sleep apnea through a sleep study? No  Do you snore loudly (loud enough to be heard through closed doors)?  0  Do you often feel tired, fatigued, or sleepy during the daytime? 0  Has anyone observed you stop breathing during your sleep? 0  Do you have, or are you being treated for high blood pressure? 1  BMI more than 35 kg/m2? 0  Age over 73 years old? 1  Neck circumference greater than 40 cm/16 inches? 1  Gender: 1  Obstructive Sleep Apnea Score 4  Score 4 or greater  Results sent to PCP

## 2014-07-20 ENCOUNTER — Observation Stay (HOSPITAL_COMMUNITY)
Admission: RE | Admit: 2014-07-20 | Discharge: 2014-07-21 | Disposition: A | Discharge status: Home / Self Care | Payer: Medicare Other | Source: Ambulatory Visit | Attending: Urology | Admitting: Urology

## 2014-07-20 ENCOUNTER — Ambulatory Visit (HOSPITAL_COMMUNITY): Payer: Medicare Other | Admitting: Registered Nurse

## 2014-07-20 ENCOUNTER — Encounter (HOSPITAL_COMMUNITY)
Admission: RE | Disposition: A | Discharge status: Home / Self Care | Payer: Self-pay | Source: Ambulatory Visit | Attending: Urology

## 2014-07-20 ENCOUNTER — Encounter (HOSPITAL_COMMUNITY): Payer: Self-pay | Admitting: *Deleted

## 2014-07-20 DIAGNOSIS — R351 Nocturia: Secondary | ICD-10-CM | POA: Diagnosis not present

## 2014-07-20 DIAGNOSIS — N210 Calculus in bladder: Principal | ICD-10-CM | POA: Insufficient documentation

## 2014-07-20 DIAGNOSIS — N400 Enlarged prostate without lower urinary tract symptoms: Secondary | ICD-10-CM | POA: Diagnosis not present

## 2014-07-20 DIAGNOSIS — I10 Essential (primary) hypertension: Secondary | ICD-10-CM | POA: Diagnosis not present

## 2014-07-20 DIAGNOSIS — E119 Type 2 diabetes mellitus without complications: Secondary | ICD-10-CM | POA: Insufficient documentation

## 2014-07-20 DIAGNOSIS — N401 Enlarged prostate with lower urinary tract symptoms: Secondary | ICD-10-CM | POA: Insufficient documentation

## 2014-07-20 DIAGNOSIS — R310 Gross hematuria: Secondary | ICD-10-CM | POA: Diagnosis present

## 2014-07-20 DIAGNOSIS — N138 Other obstructive and reflux uropathy: Secondary | ICD-10-CM | POA: Insufficient documentation

## 2014-07-20 HISTORY — PX: CYSTOSCOPY W/ RETROGRADES: SHX1426

## 2014-07-20 HISTORY — PX: HOLMIUM LASER APPLICATION: SHX5852

## 2014-07-20 HISTORY — PX: TRANSURETHRAL RESECTION OF PROSTATE: SHX73

## 2014-07-20 LAB — CBC
HCT: 40.4 % (ref 39.0–52.0)
Hemoglobin: 13.8 g/dL (ref 13.0–17.0)
MCH: 30.4 pg (ref 26.0–34.0)
MCHC: 34.2 g/dL (ref 30.0–36.0)
MCV: 89 fL (ref 78.0–100.0)
Platelets: 149 10*3/uL — ABNORMAL LOW (ref 150–400)
RBC: 4.54 MIL/uL (ref 4.22–5.81)
RDW: 12.9 % (ref 11.5–15.5)
WBC: 11.5 10*3/uL — ABNORMAL HIGH (ref 4.0–10.5)

## 2014-07-20 LAB — BASIC METABOLIC PANEL
Anion gap: 13 (ref 5–15)
BUN: 12 mg/dL (ref 6–23)
CO2: 23 mEq/L (ref 19–32)
Calcium: 8.6 mg/dL (ref 8.4–10.5)
Chloride: 106 mEq/L (ref 96–112)
Creatinine, Ser: 0.96 mg/dL (ref 0.50–1.35)
GFR calc Af Amer: >90 mL/min (ref >90)
GFR calc non Af Amer: 80 mL/min — ABNORMAL LOW (ref >90)
Glucose, Bld: 180 mg/dL — ABNORMAL HIGH (ref 70–99)
Potassium: 3.7 mEq/L (ref 3.7–5.3)
Sodium: 142 mEq/L (ref 137–147)

## 2014-07-20 LAB — GLUCOSE, CAPILLARY
Glucose-Capillary: 164 mg/dL — ABNORMAL HIGH (ref 70–99)
Glucose-Capillary: 159 mg/dL — ABNORMAL HIGH (ref 70–99)
Glucose-Capillary: 183 mg/dL — ABNORMAL HIGH (ref 70–99)
Glucose-Capillary: 219 mg/dL — ABNORMAL HIGH (ref 70–99)

## 2014-07-20 SURGERY — TURP (TRANSURETHRAL RESECTION OF PROSTATE)
Anesthesia: General

## 2014-07-20 MED ORDER — HYDROCHLOROTHIAZIDE 12.5 MG PO CAPS
12.5000 mg | ORAL_CAPSULE | Freq: Every day | ORAL | Status: DC
  Administered 2014-07-21: 12.5 mg via ORAL
  Filled 2014-07-20: qty 1

## 2014-07-20 MED ORDER — CISATRACURIUM BESYLATE (PF) 10 MG/5ML IV SOLN
INTRAVENOUS | Status: DC | PRN
  Administered 2014-07-20: 2 mg via INTRAVENOUS

## 2014-07-20 MED ORDER — HYDROMORPHONE HCL 1 MG/ML IJ SOLN: 0.2500 mg | INTRAMUSCULAR | Status: DC | PRN

## 2014-07-20 MED ORDER — ONDANSETRON HCL 4 MG/2ML IJ SOLN
INTRAMUSCULAR | Status: DC | PRN
  Administered 2014-07-20: 4 mg via INTRAVENOUS

## 2014-07-20 MED ORDER — MORPHINE SULFATE 2 MG/ML IJ SOLN: 2.0000 mg | INTRAMUSCULAR | Status: DC | PRN

## 2014-07-20 MED ORDER — OXYCODONE-ACETAMINOPHEN 5-325 MG PO TABS: 1.0000 | ORAL_TABLET | ORAL | Status: DC | PRN

## 2014-07-20 MED ORDER — ACETAMINOPHEN-CODEINE #3 300-30 MG PO TABS: 1.0000 | ORAL_TABLET | ORAL | Status: DC | PRN

## 2014-07-20 MED ORDER — IOHEXOL 300 MG/ML  SOLN
INTRAMUSCULAR | Status: DC | PRN
  Administered 2014-07-20: 6 mL via URETHRAL

## 2014-07-20 MED ORDER — SODIUM CHLORIDE 0.9 % IV SOLN
INTRAVENOUS | Status: DC
  Administered 2014-07-20 – 2014-07-21 (×2): via INTRAVENOUS

## 2014-07-20 MED ORDER — ATORVASTATIN CALCIUM 20 MG PO TABS
20.0000 mg | ORAL_TABLET | Freq: Every evening | ORAL | Status: DC
  Administered 2014-07-21: 20 mg via ORAL
  Filled 2014-07-20: qty 1

## 2014-07-20 MED ORDER — FENTANYL CITRATE 0.05 MG/ML IJ SOLN
INTRAMUSCULAR | Status: DC | PRN
  Administered 2014-07-20 (×4): 50 ug via INTRAVENOUS

## 2014-07-20 MED ORDER — PROPOFOL 10 MG/ML IV BOLUS
INTRAVENOUS | Status: DC | PRN
  Administered 2014-07-20: 180 mg via INTRAVENOUS

## 2014-07-20 MED ORDER — SODIUM CHLORIDE 0.9 % IR SOLN
Status: DC | PRN
  Administered 2014-07-20: 54000 mL

## 2014-07-20 MED ORDER — DOCUSATE SODIUM 100 MG PO CAPS: 100.0000 mg | ORAL_CAPSULE | Freq: Two times a day (BID) | ORAL | Status: DC | PRN

## 2014-07-20 MED ORDER — EPHEDRINE SULFATE 50 MG/ML IJ SOLN
INTRAMUSCULAR | Status: DC | PRN
  Administered 2014-07-20 (×3): 5 mg via INTRAVENOUS

## 2014-07-20 MED ORDER — LIDOCAINE HCL (CARDIAC) 20 MG/ML IV SOLN
INTRAVENOUS | Status: DC | PRN
  Administered 2014-07-20: 80 mg via INTRAVENOUS

## 2014-07-20 MED ORDER — CIPROFLOXACIN IN D5W 400 MG/200ML IV SOLN
INTRAVENOUS | Status: AC
  Filled 2014-07-20: qty 200

## 2014-07-20 MED ORDER — LIDOCAINE HCL (CARDIAC) 20 MG/ML IV SOLN
INTRAVENOUS | Status: AC
  Filled 2014-07-20: qty 5

## 2014-07-20 MED ORDER — AMLODIPINE BESYLATE 5 MG PO TABS
5.0000 mg | ORAL_TABLET | Freq: Every morning | ORAL | Status: DC
  Administered 2014-07-21: 5 mg via ORAL
  Filled 2014-07-20: qty 1

## 2014-07-20 MED ORDER — INSULIN ASPART 100 UNIT/ML ~~LOC~~ SOLN
0.0000 [IU] | Freq: Every day | SUBCUTANEOUS | Status: DC
  Administered 2014-07-20: 2 [IU] via SUBCUTANEOUS

## 2014-07-20 MED ORDER — LACTATED RINGERS IV SOLN
INTRAVENOUS | Status: DC | PRN
  Administered 2014-07-20 (×2): via INTRAVENOUS

## 2014-07-20 MED ORDER — CISATRACURIUM BESYLATE 20 MG/10ML IV SOLN
INTRAVENOUS | Status: AC
  Filled 2014-07-20: qty 10

## 2014-07-20 MED ORDER — INSULIN ASPART 100 UNIT/ML ~~LOC~~ SOLN
0.0000 [IU] | Freq: Three times a day (TID) | SUBCUTANEOUS | Status: DC
  Administered 2014-07-20 – 2014-07-21 (×2): 3 [IU] via SUBCUTANEOUS
  Administered 2014-07-21 (×2): 2 [IU] via SUBCUTANEOUS

## 2014-07-20 MED ORDER — ONDANSETRON HCL 4 MG/2ML IJ SOLN
INTRAMUSCULAR | Status: AC
  Filled 2014-07-20: qty 2

## 2014-07-20 MED ORDER — LIDOCAINE HCL 2 % EX GEL
CUTANEOUS | Status: AC
  Filled 2014-07-20: qty 10

## 2014-07-20 MED ORDER — PROPOFOL 10 MG/ML IV BOLUS
INTRAVENOUS | Status: AC
  Filled 2014-07-20: qty 20

## 2014-07-20 MED ORDER — LACTATED RINGERS IV SOLN: INTRAVENOUS | Status: DC

## 2014-07-20 MED ORDER — LOSARTAN POTASSIUM-HCTZ 50-12.5 MG PO TABS: 1.0000 | ORAL_TABLET | Freq: Every morning | ORAL | Status: DC

## 2014-07-20 MED ORDER — FENTANYL CITRATE 0.05 MG/ML IJ SOLN
INTRAMUSCULAR | Status: AC
  Filled 2014-07-20: qty 5

## 2014-07-20 MED ORDER — BELLADONNA ALKALOIDS-OPIUM 16.2-60 MG RE SUPP: 1.0000 | Freq: Four times a day (QID) | RECTAL | Status: DC | PRN

## 2014-07-20 MED ORDER — CIPROFLOXACIN IN D5W 400 MG/200ML IV SOLN
400.0000 mg | INTRAVENOUS | Status: AC
  Administered 2014-07-20: 400 mg via INTRAVENOUS

## 2014-07-20 MED ORDER — SUCCINYLCHOLINE CHLORIDE 20 MG/ML IJ SOLN
INTRAMUSCULAR | Status: DC | PRN
Start: 2014-07-20 — End: 2014-07-20
  Administered 2014-07-20: 100 mg via INTRAVENOUS

## 2014-07-20 MED ORDER — ONDANSETRON HCL 4 MG/2ML IJ SOLN: 4.0000 mg | INTRAMUSCULAR | Status: DC | PRN

## 2014-07-20 MED ORDER — SENNOSIDES-DOCUSATE SODIUM 8.6-50 MG PO TABS
2.0000 | ORAL_TABLET | Freq: Every day | ORAL | Status: DC
  Administered 2014-07-20: 2 via ORAL
  Filled 2014-07-20: qty 2
  Filled 2014-07-20: qty 1

## 2014-07-20 MED ORDER — BELLADONNA ALKALOIDS-OPIUM 16.2-60 MG RE SUPP
RECTAL | Status: AC
  Filled 2014-07-20: qty 1

## 2014-07-20 MED ORDER — CIPROFLOXACIN HCL 500 MG PO TABS
500.0000 mg | ORAL_TABLET | Freq: Two times a day (BID) | ORAL | Status: AC
  Administered 2014-07-20 – 2014-07-21 (×2): 500 mg via ORAL
  Filled 2014-07-20 (×2): qty 1

## 2014-07-20 MED ORDER — SODIUM CHLORIDE 0.9 % IR SOLN
3000.0000 mL | Status: DC
Start: 2014-07-20 — End: 2014-07-21
  Administered 2014-07-20 – 2014-07-21 (×4): 3000 mL

## 2014-07-20 MED ORDER — BELLADONNA ALKALOIDS-OPIUM 16.2-60 MG RE SUPP
RECTAL | Status: DC | PRN
  Administered 2014-07-20: 1 via RECTAL

## 2014-07-20 MED ORDER — LOSARTAN POTASSIUM 50 MG PO TABS
50.0000 mg | ORAL_TABLET | Freq: Every day | ORAL | Status: DC
  Administered 2014-07-21: 50 mg via ORAL
  Filled 2014-07-20: qty 1

## 2014-07-20 SURGICAL SUPPLY — 31 items
BAG URINE DRAINAGE (UROLOGICAL SUPPLIES) ×2 IMPLANT
BAG URO CATCHER STRL LF (DRAPE) ×3 IMPLANT
CARTRIDGE STONEBREAK CO2 KIDNE (ELECTROSURGICAL) ×18 IMPLANT
CATH FOLEY 3WAY 30CC 22FR (CATHETERS) ×3 IMPLANT
CATH FOLEY 3WAY 30CC 24FR (CATHETERS)
CATH URTH STD 24FR FL 3W 2 (CATHETERS) IMPLANT
DRAPE CAMERA CLOSED 9X96 (DRAPES) ×3 IMPLANT
ELECT BUTTON HF 24-28F 2 30DE (ELECTRODE) IMPLANT
ELECT LOOP MED HF 24F 12D (CUTTING LOOP) IMPLANT
ELECT LOOP MED HF 24F 12D CBL (CLIP) IMPLANT
ELECT REM PT RETURN 9FT ADLT (ELECTROSURGICAL)
ELECT RESECT VAPORIZE 12D CBL (ELECTRODE) IMPLANT
ELECTRODE REM PT RTRN 9FT ADLT (ELECTROSURGICAL) IMPLANT
EVACUATOR MICROVAS BLADDER (UROLOGICAL SUPPLIES) ×3 IMPLANT
FIBER LASER FLEXIVA 1000 (UROLOGICAL SUPPLIES) ×2 IMPLANT
GLOVE BIO SURGEON STRL SZ7.5 (GLOVE) ×6 IMPLANT
GOWN STRL REUS W/ TWL XL LVL3 (GOWN DISPOSABLE) ×2 IMPLANT
GOWN STRL REUS W/TWL XL LVL3 (GOWN DISPOSABLE) ×9 IMPLANT
GUIDEWIRE ANG ZIPWIRE 038X150 (WIRE) ×3 IMPLANT
GUIDEWIRE STR DUAL SENSOR (WIRE) ×3 IMPLANT
HOLDER FOLEY CATH W/STRAP (MISCELLANEOUS) IMPLANT
KIT ASPIRATION TUBING (SET/KITS/TRAYS/PACK) ×2 IMPLANT
LOOPS RESECTOSCOPE DISP (ELECTROSURGICAL) IMPLANT
MANIFOLD NEPTUNE II (INSTRUMENTS) ×3 IMPLANT
PACK CYSTO (CUSTOM PROCEDURE TRAY) ×3 IMPLANT
PROBE KIDNEY STONEBRKR 2.0X425 (ELECTROSURGICAL) ×2 IMPLANT
PROBE PNEUMATIC 1.0X500 (ELECTROSURGICAL) ×3 IMPLANT
PROBE PNEUMATIC 1.6MM (ELECTROSURGICAL) ×2 IMPLANT
SYR 30ML LL (SYRINGE) ×3 IMPLANT
SYRINGE IRR TOOMEY STRL 70CC (SYRINGE) ×3 IMPLANT
TUBING CONNECTING 10 (TUBING) ×3 IMPLANT

## 2014-07-20 NOTE — Discharge Instructions (Signed)
Transurethral Resection of the Prostate (TURP)  °Care After ° °Refer to this sheet in the next few weeks. These discharge instructions provide you with general information on caring for yourself after you leave the hospital. Your caregiver may also give you specific instructions. Your treatment has been planned according to the most current medical practices available, but unavoidable complications sometimes occur. If you have any problems or questions after discharge, please call your caregiver. ° °HOME CARE INSTRUCTIONS  ° °Medications °· You may receive medicine for pain management. As your level of discomfort decreases, adjustments in your pain medicines may be made.  °· Take all medicines as directed.  °· You may be given a medicine (antibiotic) to kill germs following surgery. Finish all medicines. Let your caregiver know if you have any side effects or problems from the medicine.  °· If you are on aspirin, it would be best not to restart the aspirin until the blood in the urine clears °Hygiene °· You can take a shower after surgery.  °· You should not take a bath while you still have the urethral catheter. °Activity °· You will be encouraged to get out of bed as much as possible and increase your activity level as tolerated.  °· Spend the first week in and around your home. For 3 weeks, avoid the following:  °· Straining.  °· Running.  °· Strenuous work.  °· Walks longer than a few blocks.  °· Riding for extended periods.  °· Sexual relations.  °· Do not lift heavy objects (more than 20 pounds) for at least 1 month. When lifting, use your arms instead of your abdominal muscles.  °· You will be encouraged to walk as tolerated. Do not exert yourself. Increase your activity level slowly. Remember that it is important to keep moving after an operation of any type. This cuts down on the possibility of developing blood clots.  °· Your caregiver will tell you when you can resume driving and light housework. Discuss  this at your first office visit after discharge. °Diet °· No special diet is ordered after a TURP. However, if you are on a special diet for another medical problem, it should be continued.  °· Normal fluid intake is usually recommended.  °· Avoid alcohol and caffeinated drinks for 2 weeks. They irritate the bladder. Decaffeinated drinks are okay.  °· Avoid spicy foods.  °Bladder Function °· For the first 10 days, empty the bladder whenever you feel a definite desire. Do not try to hold the urine for long periods of time.  °· Urinating once or twice a night even after you are healed is not uncommon.  °· You may see some recurrence of blood in the urine after discharge from the hospital. This usually happens within 2 weeks after the procedure.If this occurs, force fluids again as you did in the hospital and reduce your activity.  °Bowel Function °· You may experience some constipation after surgery. This can be minimized by increasing fluids and fiber in your diet. Drink enough water and fluids to keep your urine clear or pale yellow.  °· A stool softener may be prescribed for use at home. Do not strain to move your bowels.  °· If you are requiring increased pain medicine, it is important that you take stool softeners to prevent constipation. This will help to promote proper healing by reducing the need to strain to move your bowels.  °Sexual Activity °· Semen movement in the opposite direction and into the   bladder (retrograde ejaculation) may occur. Since the semen passes into the bladder, cloudy urine can occur the first time you urinate after intercourse. Or, you may not have an ejaculation during erection. Ask your caregiver when you can resume sexual activity. Retrograde ejaculation and reduced semen discharge should not reduce one's pleasure of intercourse.  °Postoperative Visit °· Arrange the date and time of your after surgery visit with your caregiver.  °Return to Work °· After your recovery is complete, you  will be able to return to work and resume all activities. Your caregiver will inform you when you can return to work.  ° ° °Foley Catheter Care °A soft, flexible tube (Foley catheter) may have been placed in your bladder to drain urine and fluid. Follow these instructions: °Taking Care of the Catheter °· Keep the area where the catheter leaves your body clean.  °· Attach the catheter to the leg so there is no tension on the catheter.  °· Keep the drainage bag below the level of the bladder, but keep it OFF the floor.  °· Do not take long soaking baths. Your caregiver will give instructions about showering.  °· Wash your hands before touching ANYTHING related to the catheter or bag.  °· Using mild soap and warm water on a washcloth:  °· Clean the area closest to the catheter insertion site using a circular motion around the catheter.  °· Clean the catheter itself by wiping AWAY from the insertion site for several inches down the tube.  °· NEVER wipe upward as this could sweep bacteria up into the urethra (tube in your body that normally drains the bladder) and cause infection.  °· Place a small amount of sterile lubricant at the tip of the penis where the catheter is entering.  °Taking Care of the Drainage Bags °· Two drainage bags may be taken home: a large overnight drainage bag, and a smaller leg bag which fits underneath clothing.  °· It is okay to wear the overnight bag at any time, but NEVER wear the smaller leg bag at night.  °· Keep the drainage bag well below the level of your bladder. This prevents backflow of urine into the bladder and allows the urine to drain freely.  °· Anchor the tubing to your leg to prevent pulling or tension on the catheter. Use tape or a leg strap provided by the hospital.  °· Empty the drainage bag when it is 1/2 to 3/4 full. Wash your hands before and after touching the bag.  °· Periodically check the tubing for kinks to make sure there is no pressure on the tubing which could  restrict the flow of urine.  °Changing the Drainage Bags °· Cleanse both ends of the clean bag with alcohol before changing.  °· Pinch off the rubber catheter to avoid urine spillage during the disconnection.  °· Disconnect the dirty bag and connect the clean one.  °· Empty the dirty bag carefully to avoid a urine spill.  °· Attach the new bag to the leg with tape or a leg strap.  °Cleaning the Drainage Bags °· Whenever a drainage bag is disconnected, it must be cleaned quickly so it is ready for the next use.  °· Wash the bag in warm, soapy water.  °· Rinse the bag thoroughly with warm water.  °· Soak the bag for 30 minutes in a solution of white vinegar and water (1 cup vinegar to 1 quart warm water).  °· Rinse with warm water.  °  SEEK MEDICAL CARE IF:  °· You have chills or night sweats.  °· You are leaking around your catheter or have problems with your catheter. It is not uncommon to have sporadic leakage around your catheter as a result of bladder spasms. If the leakage stops, there is not much need for concern. If you are uncertain, call your caregiver.  °· You develop side effects that you think are coming from your medicines.  °SEEK IMMEDIATE MEDICAL CARE IF:  °· You are suddenly unable to urinate. Check to see if there are any kinks in the drainage tubing that may cause this. If you cannot find any kinks, call your caregiver immediately. This is an emergency.  °· You develop shortness of breath or chest pains.  °· Bleeding persists or clots develop in your urine.  °· You have a fever.  °· You develop pain in your back or over your lower belly (abdomen).  °· You develop pain or swelling in your legs.  °· Any problems you are having get worse rather than better.  °MAKE SURE YOU:  °· Understand these instructions.  °· Will watch your condition.  °· Will get help right away if you are not doing well or get worse.  ° ° °

## 2014-07-20 NOTE — Anesthesia Postprocedure Evaluation (Signed)
  Anesthesia Post-op Note  Patient: Bobby Lopez [facility]  Procedure(s) Performed: Procedure(s) (LRB): TRANSURETHRAL RESECTION OF THE PROSTATE (TURP) WITH GYRUS (N/A) CYSTOLITHALOPAXY (Bilateral) HOLMIUM LASER APPLICATION (N/A)  Patient Location: PACU  Anesthesia Type: General  Level of Consciousness: awake and alert   Airway and Oxygen Therapy: Patient Spontanous Breathing  Post-op Pain: mild  Post-op Assessment: Post-op Vital signs reviewed, Patient's Cardiovascular Status Stable, Respiratory Function Stable, Patent Airway and No signs of Nausea or vomiting  Last Vitals:  Filed Vitals:   07/20/14 1430  BP: 126/67  Pulse: 78  Temp: 36.7 C  Resp: 11    Post-op Vital Signs: stable   Complications: No apparent anesthesia complications

## 2014-07-20 NOTE — Transfer of Care (Signed)
Immediate Anesthesia Transfer of Care Note  Patient: Bobby Lopez [facility]  Procedure(s) Performed: Procedure(s): TRANSURETHRAL RESECTION OF THE PROSTATE (TURP) WITH GYRUS (N/A) CYSTOLITHALOPAXY (Bilateral) HOLMIUM LASER APPLICATION (N/A)  Patient Location: PACU  Anesthesia Type:General  Level of Consciousness: awake, alert , oriented and patient cooperative  Airway & Oxygen Therapy: Patient Spontanous Breathing and Patient connected to face mask oxygen  Post-op Assessment: Report given to PACU RN, Post -op Vital signs reviewed and stable and Patient moving all extremities  Post vital signs: Reviewed and stable  Complications: No apparent anesthesia complications

## 2014-07-20 NOTE — Progress Notes (Signed)
Pt received from PACU to room 1413. Report accepted and Pt oriented to room and floor

## 2014-07-20 NOTE — Anesthesia Preprocedure Evaluation (Addendum)
Anesthesia Evaluation  Patient identified by MRN, date of birth, ID band Patient awake    Reviewed: Allergy & Precautions, H&P , NPO status , Patient's Chart, lab work & pertinent test results, reviewed documented beta blocker date and time   History of Anesthesia Complications Negative for: history of anesthetic complications  Airway Mallampati: II  TM Distance: >3 FB Neck ROM: Full    Dental  (+) Dental Advisory Given, Teeth Intact   Pulmonary former smoker,  breath sounds clear to auscultation  Pulmonary exam normal       Cardiovascular hypertension, Pt. on medications DVT (1987) Rhythm:Regular Rate:Normal     Neuro/Psych negative neurological ROS     GI/Hepatic negative GI ROS, Neg liver ROS,   Endo/Other  diabetes, Well Controlled, Type 2, Oral Hypoglycemic Agents  Renal/GU negative Renal ROS     Musculoskeletal   Abdominal   Peds  Hematology negative hematology ROS (+)   Anesthesia Other Findings   Reproductive/Obstetrics                           Anesthesia Physical Anesthesia Plan  ASA: III  Anesthesia Plan: General   Post-op Pain Management:    Induction:   Airway Management Planned: Oral ETT  Additional Equipment:   Intra-op Plan:   Post-operative Plan: Extubation in OR  Informed Consent:   Plan Discussed with: Surgeon  Anesthesia Plan Comments:         Anesthesia Quick Evaluation

## 2014-07-20 NOTE — Op Note (Signed)
Preoperative diagnosis:  1. Gross hematuria 2. Bladder stones 3. Obstructing prostate   Postoperative diagnosis:  1. As above   Procedure: 1. Aborted retrograde pyelograms 2. Cystolitholapaxy greater than 2.5 cm 3. Bipolar TURP  Surgeon: Bobby Hughs, MD  Anesthesia: General  Complications: I was unable to pass a wire into the patient's distal ureter cleanly.  This was due to the enlarged prostate and the J hooking of the ureter.  I did attempt to pass a wire through the patient's left ureteral orifice and into the distal ureter, and based on fluoroscopy it appeared to be following the expected path of the ureter.  However I passed a catheter over the wire into the distal ureter and injected contrast which extravasated.  I aborted the retrograde pyelograms bilaterally.  Intraoperative findings: high median bar of the prostate, 10-15 1 cm bladder stones, J hooking ureters, indurated bladder mucosa consistent with irritation from the stones.  EBL: 300  Specimens: TURP chips and bladder stones  Indication: Bobby Lopez is a 74 y.o. patient with gross hematuria.  Workup for his hematuria demonstrated a very large obstructing prostate and numerous bladder stones.After reviewing the management options for treatment, he elected to proceed with the above surgical procedure(s). We have discussed the potential benefits and risks of the procedure, side effects of the proposed treatment, the likelihood of the patient achieving the goals of the procedure, and any potential problems that might occur during the procedure or recuperation. Informed consent has been obtained.  Description of procedure:  The patient was taken to the operating room and general anesthesia was induced.  The patient was placed in the dorsal lithotomy position, prepped and draped in the usual sterile fashion, and preoperative antibiotics were administered. A preoperative time-out was performed.   With the 30 22.5  French cystoscope by gently passed through the patient's urethra and into the bladder under visual guidance.  I then attempted to perform retrograde pyelograms as noted above.  I was unable to do this because of the J hooking of the patient's distal ureters secondary to his large prostate.  At this point there was already fair amount of prosthetic bleeding making visualization difficult.  However, I did attempt to fragment several of the bladder stones using the pneumatic stone breaker.  This became increasingly difficult as the visualization diminished and the stones began moving around.  I then inserted the 26 French resectoscope sheath using the visual obturator.  I then exchanged the visual obturator for a resectoscope loop and began the TURP by creating a groove starting at 5 and 7 o'clock position of the bladder neck and carrying it down to the prostate apex.  These screws were taken down to the capsule of the prostate.  I then resected the median lobe connecting the 2 grooves down to the bladder neck taking care not to undermine the bladder neck.  I then resected the patient's left lateral prostate lobe from 2:00 on the bladder neck to 5:00 down to the apex.  We then resected the patient's right lateral lobe in a similar fashion from the 10 o'clock position to the 7 o'clock position.  Hemostasis was achieved such that all arterial bleeders were cauterized and controlled.  There was some venous bleeding. We then Elliked out all the prostate chips. At this point we exchanged the 26 French resectoscope sheath for a 25 French sheath and attempted to break the bladder stones up using crushing forceps.  This proved problematic because of difficult visualization.  We then resumed fragmentation of the bladder stones using the pneumatic stone breaker.  We were able to fragment most of the stones and remove them through this sheath using the [place].  However, again the visualization became bloodyand it was difficult  to aim the long pneumatic stone breaker without damaging the mucosa.  As such, we exchanged the pneumatic stone breaker for the 1000  laser fiber.  We then fragmented the remaining stones at a rate of 50 Hz and 0.8 J.  All the remaining fragments were ultimately removed.  I was unable to visualize the ureteral orifice ease at the end of the case and there was notable mucosal damage/trauma from the stone fragmentation and removal process.  At this point I passed a 79 [place] three-way Foley catheter into the patient's bladder and inflated the balloon with 30 cc of sterile water.  The patient was placed on continuous bladder irrigation, awoken from anesthesia, and transferred to the PACU in stable condition.  A B&O suppository was placed at the end of the case.  Bobby Lopez, M.D.

## 2014-07-20 NOTE — H&P (Signed)
History of Present Illness This 73 year old patient referred by Dr. Delma Freeze, M.D. for evaluation and management of hematuria. Work-up revealed a very long obstructing prostatic urethra as well as ~5 one cm stones within his bladder. He was recently on anticoagulation of DVTs and has been cleared to come off this for any procedure. At the time of his last encounter, I recommended that the patient under go a bi-polar TURP, cystolithalopaxy as well as bilateral retrograde pyelograms to complete hematuria eval. Patient presents today for discussion of surgery.    Since the patient was last seen he developed low back pain and has had 2 cortisone injections. He denies any worsening or progression of his lower urinary tract symptoms. Specifically he denies any gross hematuria or dysuria. He denies any urinary incontinence. He continues to have weak stream and is getting up at night often to [place].   Review of Systems No changes in pts bowel habits, neurological changes, or progressive lower urinary tract symptoms.    Vitals Vital Signs [Data Includes: Last 1 Day]  Recorded: 12Oct2015 12:20PM  Blood Pressure: 113 / 68 Temperature: 98.2 F Heart Rate: 64  Physical Exam Constitutional: Well nourished and well developed . No acute distress.  ENT:. The ears and nose are normal in appearance.  Neck: The appearance of the neck is normal and no neck mass is present.  Pulmonary: No respiratory distress and normal respiratory rhythm and effort.  Cardiovascular: Heart rate and rhythm are normal . No peripheral edema.  Abdomen: The abdomen is soft and nontender. No masses are palpated. No suprapubic tenderness. No CVA tenderness. No hernias are palpable. No hepatosplenomegaly noted.  Lymphatics: The femoral and inguinal nodes are not enlarged or tender.  Skin: Normal skin turgor, no visible rash and no visible skin lesions.  Neuro/Psych:. Mood and affect are appropriate.    Results/Data Urine [Data  Includes: Last 1 Day]   12Oct2015  COLOR YELLOW   APPEARANCE CLEAR   SPECIFIC GRAVITY 1.010   pH 5.5   GLUCOSE NEG mg/dL  BILIRUBIN NEG   KETONE NEG mg/dL  BLOOD MOD   PROTEIN NEG mg/dL  UROBILINOGEN 0.2 mg/dL  NITRITE POS   LEUKOCYTE ESTERASE MOD   SQUAMOUS EPITHELIAL/HPF FEW   WBC 11-20 WBC/hpf  RBC 7-10 RBC/hpf  BACTERIA FEW   CRYSTALS NONE SEEN   CASTS NONE SEEN    Patient's urinalysis today demonstrates microscopic hematuri as well as bacteria. We will have this cultured.a   Assessment Patient has bladder outlet obstruction secondary to an enlarged prostate with associated bladder stones. He is also noted to have hematuria.   Plan Benign prostatic hyperplasia with urinary obstruction  1. PSA; Status:Hold For - Specimen/Data Collection,Appointment; Requested  for:12Oct2015;  Benign prostatic hyperplasia with urinary obstruction, Bladder stone  2. Follow-up Schedule Surgery Office  Follow-up  Status: Complete  Done: 12Oct2015 Bladder stone  3. URINE CULTURE; Status:Hold For - Specimen/Data Collection,Appointment; Requested  for:12Oct2015;  Health Maintenance  4. UA With REFLEX; [Do Not Release]; Status:Complete;   Done: [id number] 11:54AM  Discussion/Summary The plan is to proceed to the operating room for TURP and simultaneous cystolitholapaxy and bilateral retrograde pyelograms. I ordered a PSA and urine culture today as well. I've gone over the procedure in detail. We discussed the risk and the benefits. We discussed the expected postoperative course. The patient will likely remain in the hospital overnight followed by a voiding trial.

## 2014-07-20 NOTE — Care Management Note (Signed)
    Page 1 of 1   07/20/2014     3:38:22 PM CARE MANAGEMENT NOTE 07/20/2014  Patient:  Bobby Lopez, Bobby Lopez   Account Number:  [id number]  Date Initiated:  07/20/2014  Documentation initiated by:  Dessa Phi  Subjective/Objective Assessment:   73 Y/O M ADMITTED W/BPH.     Action/Plan:   FROM HOME.   Anticipated DC Date:  07/21/2014   Anticipated DC Plan:  [place]  CM consult      Choice offered to / List presented to:             Status of service:  In process, will continue to follow Medicare Important Message given?   (If response is "NO", the following Medicare IM given date fields will be blank) Date Medicare IM given:   Medicare IM given by:   Date Additional Medicare IM given:   Additional Medicare IM given by:    Discharge Disposition:    Per UR Regulation:  Reviewed for med. necessity/level of care/duration of stay  If discussed at Short of Stay Meetings, dates discussed:    Comments:  07/20/14 Bobby Cihlar RN,BSN NCM [phone] S/P TURP.NO ANTICIPATED D/C NEEDS.

## 2014-07-20 NOTE — Interval H&P Note (Signed)
History and Physical Interval Note:  07/20/2014 9:38 AM  Bobby Lopez  has presented today for surgery, with the diagnosis of BENIGN PROSTATIC HYPERPLASIA/ BLADDER STONE  The various methods of treatment have been discussed with the patient and family. After consideration of risks, benefits and other options for treatment, the patient has consented to  Procedure(s): TRANSURETHRAL RESECTION OF THE PROSTATE (TURP) WITH GYRUS (N/A) CYSTOLITHALOPAXY/BILATERAL RETROGRADE PYLEOGRAM (Bilateral) HOLMIUM LASER APPLICATION (N/A) as a surgical intervention .  The patient's history has been reviewed, patient examined, no change in status, stable for surgery.  I have reviewed the patient's chart and labs.  Questions were answered to the patient's satisfaction.     Louis Meckel W

## 2014-07-20 NOTE — Plan of Care (Signed)
Problem: Consults Goal: TURP/TURBT Patient Education (See Patient Education module for education specifics.) Outcome: Progressing

## 2014-07-21 DIAGNOSIS — N210 Calculus in bladder: Secondary | ICD-10-CM | POA: Diagnosis not present

## 2014-07-21 LAB — CBC
HCT: 34.5 % — ABNORMAL LOW (ref 39.0–52.0)
Hemoglobin: 11.7 g/dL — ABNORMAL LOW (ref 13.0–17.0)
MCH: 30.3 pg (ref 26.0–34.0)
MCHC: 33.9 g/dL (ref 30.0–36.0)
MCV: 89.4 fL (ref 78.0–100.0)
Platelets: 137 10*3/uL — ABNORMAL LOW (ref 150–400)
RBC: 3.86 MIL/uL — ABNORMAL LOW (ref 4.22–5.81)
RDW: 13.1 % (ref 11.5–15.5)
WBC: 11.7 10*3/uL — ABNORMAL HIGH (ref 4.0–10.5)

## 2014-07-21 LAB — BASIC METABOLIC PANEL
Anion gap: 8 (ref 5–15)
BUN: 9 mg/dL (ref 6–23)
CO2: 28 mEq/L (ref 19–32)
Calcium: 8.4 mg/dL (ref 8.4–10.5)
Chloride: 105 mEq/L (ref 96–112)
Creatinine, Ser: 1 mg/dL (ref 0.50–1.35)
GFR calc Af Amer: 84 mL/min — ABNORMAL LOW (ref >90)
GFR calc non Af Amer: 73 mL/min — ABNORMAL LOW (ref >90)
Glucose, Bld: 173 mg/dL — ABNORMAL HIGH (ref 70–99)
Potassium: 4 mEq/L (ref 3.7–5.3)
Sodium: 141 mEq/L (ref 137–147)

## 2014-07-21 LAB — GLUCOSE, CAPILLARY
Glucose-Capillary: 162 mg/dL — ABNORMAL HIGH (ref 70–99)
Glucose-Capillary: 149 mg/dL — ABNORMAL HIGH (ref 70–99)
Glucose-Capillary: 129 mg/dL — ABNORMAL HIGH (ref 70–99)

## 2014-07-21 NOTE — Discharge Summary (Signed)
  Date of admission: 07/20/2014  Date of discharge: 07/21/2014  Admission diagnosis: Bladder outlet obstruction due to BPH, bladder calculi  Discharge diagnosis: Same as above.  History and Physical: For full details, please see admission history and physical. Briefly, Bobby Lopez is a 73 y.o. year old patient with bladder stones and bladder outlet obstruction due to BPH.   Hospital Course: He underwent cystoscopy with cystolitholapaxy and TURP on 07/20/14.  He tolerated his procedure well and was maintained on CBI overnight.  His CBI was able to be titrated off over the next 24 hrs and he was able to be discharged home with his catheter on POD # 1.  Laboratory values:  Recent Labs  07/20/14 1415 07/21/14 0401  HGB 13.8 11.7*  HCT 40.4 34.5*    Recent Labs  07/20/14 1415 07/21/14 0401  CREATININE 0.96 1.00    Disposition: Home  Discharge instruction: The patient was instructed to be ambulatory but told to refrain from heavy lifting, strenuous activity, or driving.   Discharge medications:    Medication List    STOP taking these medications        aspirin EC 81 MG tablet      TAKE these medications        acetaminophen-codeine 300-30 MG per tablet  Commonly known as:  TYLENOL #3  Take 1 tablet by mouth every 4 (four) hours as needed.     ALIGN PO  Take 1 tablet by mouth daily.     amLODipine 5 MG tablet  Commonly known as:  NORVASC  Take 5 mg by mouth every morning.     atorvastatin 20 MG tablet  Commonly known as:  LIPITOR  Take 20 mg by mouth every evening.     docusate sodium 100 MG capsule  Commonly known as:  COLACE  Take 1 capsule (100 mg total) by mouth 2 (two) times daily as needed (take to keep stool soft.).     losartan-hydrochlorothiazide 50-12.5 MG per tablet  Commonly known as:  HYZAAR  Take 1 tablet by mouth every morning.     metFORMIN 500 MG tablet  Commonly known as:  GLUCOPHAGE  Take 500 mg by mouth 2 (two) times daily with a meal.      multivitamin with minerals Tabs tablet  Take 1 tablet by mouth daily.     omega-3 acid ethyl esters 1 G capsule  Commonly known as:  LOVAZA  Take 1 g by mouth daily.        Followup:      Follow-up Information    Follow up with Ardis Hughs, MD On 07/23/2014.   Specialty:  Urology   Why:  for catheter removal   Contact information:   [address] [place] [phone]

## 2014-07-21 NOTE — Progress Notes (Signed)
Utilization Review completed.  

## 2014-07-21 NOTE — Plan of Care (Signed)
Problem: Phase I Progression Outcomes Goal: Pain controlled with appropriate interventions Outcome: Completed/Met Date Met:  07/21/14 Goal: Bedrest x 6 hrs for spinal anesthesia Outcome: Not Applicable Date Met:  19/82/42 Goal: No urinary obstruction (foley to traction) Outcome: Progressing Goal: Initial discharge plan identified Outcome: Completed/Met Date Met:  07/21/14 Goal: Tubes/drains patent Outcome: Progressing Goal: Continue bladder and/or hand irrigation Outcome: Progressing Goal: Hemodynamically stable Outcome: Progressing Goal: Tolerating diet Outcome: Progressing Goal: No symptoms of Post-TURP Syndrome Outcome: Completed/Met Date Met:  07/21/14 Goal: Other Phase I Outcomes/Goals Outcome: Not Progressing

## 2014-07-21 NOTE — Progress Notes (Signed)
Patient ID: Bobby Lopez, male   DOB: 1941-08-06, 73 y.o.   MRN: [id number]  1 Day Post-Op Subjective: Urine draining well on minimal CBI. CBI did have to be turned up last night but now at slow drip.  No other complaints.  Objective: Vital signs in last 24 hours: Temp:  [97.5 F (36.4 C)-99.9 F (37.7 C)] 99.9 F (37.7 C) (11/07 0602) Pulse Rate:  [78-95] 86 (11/07 0602) Resp:  [11-16] 16 (11/07 0602) BP: (98-135)/(56-78) 103/61 mmHg (11/07 0602) SpO2:  [97 %-100 %] 97 % (11/07 0602) FiO2 (%):  [2 %] 2 % (11/06 1500) Weight:  [94.421 kg (208 lb 2.6 oz)] 94.421 kg (208 lb 2.6 oz) (11/06 1500)  Intake/Output from previous day: 11/06 0701 - 11/07 0700 In: 15720 [P.O.:120; I.V.:3600] Out: 90300 [[id number]; Blood:300] Intake/Output this shift:    Physical Exam:  General: Alert and oriented GU: Urine pink on minimal CBI  Lab Results:  Recent Labs  07/20/14 1415 07/21/14 0401  HGB 13.8 11.7*  HCT 40.4 34.5*   BMET  Recent Labs  07/20/14 1415 07/21/14 0401  NA 142 141  K 3.7 4.0  CL 106 105  CO2 23 28  GLUCOSE 180* 173*  BUN 12 9  CREATININE 0.96 1.00  CALCIUM 8.6 8.4     Studies/Results: No results found.  Assessment/Plan: - Stop CBI and monitor urine.  If remains mostly clear/pink off CBI, will D/C home with catheter   LOS: 1 day   Anarie Kalish,LES 07/21/2014, 8:28 AM

## 2014-07-21 NOTE — Progress Notes (Signed)
Pt leaving at this time with his wife at his side.  Alert, oriented, and without c/o. Discharge instructions/prescriptions given/explained with pt and spouse verbalizing understanding. Foley catheter care instructions and leg bag info given.  Supplies given for Foley care. Followup appointment noted.

## 2014-07-21 NOTE — Plan of Care (Signed)
Problem: Discharge Progression Outcomes Goal: Barriers To Progression Addressed/Resolved Outcome: Completed/Met Date Met:  07/21/14 Goal: Discharge plan in place and appropriate Outcome: Completed/Met Date Met:  07/21/14 Goal: Pain controlled with appropriate interventions Outcome: Completed/Met Date Met:  07/21/14 Goal: Hemodynamically stable Outcome: Completed/Met Date Met:  70/35/00 Goal: Complications resolved/controlled Outcome: Completed/Met Date Met:  07/21/14 Goal: Tolerating diet Outcome: Completed/Met Date Met:  07/21/14 Goal: Activity without assist or at baseline Outcome: Completed/Met Date Met:  07/21/14 Goal: Other Discharge Outcomes/Goals Outcome: Completed/Met Date Met:  07/21/14

## 2014-07-23 ENCOUNTER — Encounter (HOSPITAL_COMMUNITY): Payer: Self-pay | Admitting: Urology

## 2014-08-06 ENCOUNTER — Other Ambulatory Visit: Payer: Self-pay | Admitting: Family Medicine

## 2014-08-06 NOTE — Telephone Encounter (Signed)
Can we refill this? 

## 2014-08-08 ENCOUNTER — Other Ambulatory Visit: Payer: Self-pay | Admitting: Family Medicine

## 2014-08-16 ENCOUNTER — Other Ambulatory Visit: Payer: Self-pay | Admitting: Family Medicine

## 2014-08-23 DIAGNOSIS — N401 Enlarged prostate with lower urinary tract symptoms: Secondary | ICD-10-CM | POA: Diagnosis not present

## 2014-08-24 DIAGNOSIS — E119 Type 2 diabetes mellitus without complications: Secondary | ICD-10-CM | POA: Diagnosis not present

## 2014-08-24 LAB — HM DIABETES EYE EXAM

## 2014-09-01 ENCOUNTER — Other Ambulatory Visit: Payer: Self-pay | Admitting: Family Medicine

## 2014-09-03 ENCOUNTER — Other Ambulatory Visit (INDEPENDENT_AMBULATORY_CARE_PROVIDER_SITE_OTHER): Payer: Medicare Other

## 2014-09-03 DIAGNOSIS — E039 Hypothyroidism, unspecified: Secondary | ICD-10-CM | POA: Diagnosis not present

## 2014-09-03 DIAGNOSIS — D649 Anemia, unspecified: Secondary | ICD-10-CM | POA: Diagnosis not present

## 2014-09-03 DIAGNOSIS — E119 Type 2 diabetes mellitus without complications: Secondary | ICD-10-CM | POA: Diagnosis not present

## 2014-09-03 DIAGNOSIS — R300 Dysuria: Secondary | ICD-10-CM | POA: Diagnosis not present

## 2014-09-03 DIAGNOSIS — I10 Essential (primary) hypertension: Secondary | ICD-10-CM | POA: Diagnosis not present

## 2014-09-03 DIAGNOSIS — E785 Hyperlipidemia, unspecified: Secondary | ICD-10-CM | POA: Diagnosis not present

## 2014-09-03 DIAGNOSIS — N400 Enlarged prostate without lower urinary tract symptoms: Secondary | ICD-10-CM

## 2014-09-03 LAB — MICROALBUMIN / CREATININE URINE RATIO
Creatinine,U: 161.6 mg/dL
Microalb Creat Ratio: 6.5 mg/g (ref 0.0–30.0)
Microalb, Ur: 10.5 mg/dL — ABNORMAL HIGH (ref 0.0–1.9)

## 2014-09-03 LAB — URINALYSIS, MICROSCOPIC ONLY

## 2014-09-03 LAB — CBC WITH DIFFERENTIAL/PLATELET
Basophils Absolute: 0 10*3/uL (ref 0.0–0.1)
Basophils Relative: 0.5 % (ref 0.0–3.0)
Eosinophils Absolute: 0.1 10*3/uL (ref 0.0–0.7)
Eosinophils Relative: 1.8 % (ref 0.0–5.0)
HCT: 42.6 % (ref 39.0–52.0)
Hemoglobin: 14 g/dL (ref 13.0–17.0)
Lymphocytes Relative: 29.3 % (ref 12.0–46.0)
Lymphs Abs: 2.3 10*3/uL (ref 0.7–4.0)
MCHC: 32.8 g/dL (ref 30.0–36.0)
MCV: 91.4 fl (ref 78.0–100.0)
Monocytes Absolute: 0.7 10*3/uL (ref 0.1–1.0)
Monocytes Relative: 8.8 % (ref 3.0–12.0)
Neutro Abs: 4.6 10*3/uL (ref 1.4–7.7)
Neutrophils Relative %: 59.6 % (ref 43.0–77.0)
Platelets: 194 10*3/uL (ref 150.0–400.0)
RBC: 4.66 Mil/uL (ref 4.22–5.81)
RDW: 13.5 % (ref 11.5–15.5)
WBC: 7.8 10*3/uL (ref 4.0–10.5)

## 2014-09-03 LAB — POCT URINALYSIS DIPSTICK
Bilirubin, UA: NEGATIVE
Glucose, UA: NEGATIVE
Ketones, UA: NEGATIVE
Nitrite, UA: NEGATIVE
Spec Grav, UA: 1.02
Urobilinogen, UA: 0.2
pH, UA: 5

## 2014-09-03 LAB — BASIC METABOLIC PANEL
BUN: 15 mg/dL (ref 6–23)
CO2: 27 mEq/L (ref 19–32)
Calcium: 9.2 mg/dL (ref 8.4–10.5)
Chloride: 105 mEq/L (ref 96–112)
Creatinine, Ser: 1 mg/dL (ref 0.4–1.5)
GFR: 80.46 mL/min (ref >60.00)
Glucose, Bld: 163 mg/dL — ABNORMAL HIGH (ref 70–99)
Potassium: 4.9 mEq/L (ref 3.5–5.1)
Sodium: 139 mEq/L (ref 135–145)

## 2014-09-03 LAB — LIPID PANEL
Cholesterol: 117 mg/dL (ref 0–200)
HDL: 32 mg/dL — ABNORMAL LOW (ref >39.00)
LDL Cholesterol: 69 mg/dL (ref 0–99)
NonHDL: 85
Total CHOL/HDL Ratio: 4
Triglycerides: 81 mg/dL (ref 0.0–149.0)
VLDL: 16.2 mg/dL (ref 0.0–40.0)

## 2014-09-03 LAB — HEPATIC FUNCTION PANEL
ALT: 16 U/L (ref 0–53)
AST: 18 U/L (ref 0–37)
Albumin: 4 g/dL (ref 3.5–5.2)
Alkaline Phosphatase: 46 U/L (ref 39–117)
Bilirubin, Direct: 0.1 mg/dL (ref 0.0–0.3)
Total Bilirubin: 0.8 mg/dL (ref 0.2–1.2)
Total Protein: 6.7 g/dL (ref 6.0–8.3)

## 2014-09-03 LAB — HEMOGLOBIN A1C: Hgb A1c MFr Bld: 6.5 % (ref 4.6–6.5)

## 2014-09-03 NOTE — Telephone Encounter (Signed)
Sent to the pharmacy by e-scribe for 1 month.  Pt has upcoming CPX on 09/11/14

## 2014-09-03 NOTE — Addendum Note (Signed)
Addended by: Denna Haggard K on: 09/03/2014 10:00 AM   Modules accepted: Orders

## 2014-09-04 LAB — PSA: PSA: 1.06 ng/mL (ref 0.10–4.00)

## 2014-09-04 LAB — TSH: TSH: 3.57 u[IU]/mL (ref 0.35–4.50)

## 2014-09-05 LAB — URINE CULTURE
Colony Count: NO GROWTH
Organism ID, Bacteria: NO GROWTH

## 2014-09-11 ENCOUNTER — Encounter: Payer: Self-pay | Admitting: Family Medicine

## 2014-09-11 ENCOUNTER — Ambulatory Visit (INDEPENDENT_AMBULATORY_CARE_PROVIDER_SITE_OTHER): Payer: Medicare Other | Admitting: Family Medicine

## 2014-09-11 VITALS — BP 120/71 | HR 65 | Temp 97.9°F | Ht 72.0 in | Wt 207.0 lb

## 2014-09-11 DIAGNOSIS — I10 Essential (primary) hypertension: Secondary | ICD-10-CM

## 2014-09-11 DIAGNOSIS — N390 Urinary tract infection, site not specified: Secondary | ICD-10-CM | POA: Diagnosis not present

## 2014-09-11 DIAGNOSIS — E119 Type 2 diabetes mellitus without complications: Secondary | ICD-10-CM | POA: Diagnosis not present

## 2014-09-11 DIAGNOSIS — N400 Enlarged prostate without lower urinary tract symptoms: Secondary | ICD-10-CM

## 2014-09-11 DIAGNOSIS — Z23 Encounter for immunization: Secondary | ICD-10-CM | POA: Diagnosis not present

## 2014-09-11 DIAGNOSIS — E785 Hyperlipidemia, unspecified: Secondary | ICD-10-CM

## 2014-09-11 DIAGNOSIS — R319 Hematuria, unspecified: Secondary | ICD-10-CM | POA: Diagnosis not present

## 2014-09-11 DIAGNOSIS — I82409 Acute embolism and thrombosis of unspecified deep veins of unspecified lower extremity: Secondary | ICD-10-CM | POA: Diagnosis not present

## 2014-09-11 MED ORDER — CIPROFLOXACIN HCL 500 MG PO TABS: 500.0000 mg | ORAL_TABLET | Freq: Two times a day (BID) | ORAL | Status: DC

## 2014-09-11 MED ORDER — AMLODIPINE BESYLATE 5 MG PO TABS: 5.0000 mg | ORAL_TABLET | Freq: Every morning | ORAL | Status: DC

## 2014-09-11 MED ORDER — LOSARTAN POTASSIUM-HCTZ 50-12.5 MG PO TABS: 1.0000 | ORAL_TABLET | Freq: Every morning | ORAL | Status: DC

## 2014-09-11 MED ORDER — ATORVASTATIN CALCIUM 20 MG PO TABS: 20.0000 mg | ORAL_TABLET | Freq: Every evening | ORAL | Status: DC

## 2014-09-11 MED ORDER — METFORMIN HCL 500 MG PO TABS: 500.0000 mg | ORAL_TABLET | Freq: Two times a day (BID) | ORAL | Status: DC

## 2014-09-11 NOTE — Progress Notes (Signed)
   Subjective:    Patient ID: Bobby Lopez, male    DOB: 1941/08/13, 73 y.o.   MRN: [id number]  HPI 73 yr old male to follow up. He feels well and his recent TURP surgery went well. He now urinates freely and has no sx. However his labs showed bacteria in the urine so we started him on Cipro. He wants to start exercising again, and now that his wife is retiring they plan to go on walks every day. His BP is stable. He wears compression stockings on both feet.    Review of Systems  Constitutional: Negative.   HENT: Negative.   Eyes: Negative.   Respiratory: Negative.   Cardiovascular: Negative.   Gastrointestinal: Negative.   Genitourinary: Negative.   Musculoskeletal: Negative.   Skin: Negative.   Neurological: Negative.   Psychiatric/Behavioral: Negative.        Objective:   Physical Exam  Constitutional: He is oriented to person, place, and time. He appears well-developed and well-nourished. No distress.  HENT:  Head: Normocephalic and atraumatic.  Right Ear: External ear normal.  Left Ear: External ear normal.  Nose: Nose normal.  Mouth/Throat: Oropharynx is clear and moist. No oropharyngeal exudate.  Eyes: Conjunctivae and EOM are normal. Pupils are equal, round, and reactive to light. Right eye exhibits no discharge. Left eye exhibits no discharge. No scleral icterus.  Neck: Neck supple. No JVD present. No tracheal deviation present. No thyromegaly present.  Cardiovascular: Normal rate, regular rhythm, normal heart sounds and intact distal pulses.  Exam reveals no gallop and no friction rub.   No murmur heard. EKG normal   Pulmonary/Chest: Effort normal and breath sounds normal. No respiratory distress. He has no wheezes. He has no rales. He exhibits no tenderness.  Abdominal: Soft. Bowel sounds are normal. He exhibits no distension and no mass. There is no tenderness. There is no rebound and no guarding.  Musculoskeletal: Normal range of motion. He exhibits no edema or  tenderness.  Lymphadenopathy:    He has no cervical adenopathy.  Neurological: He is alert and oriented to person, place, and time. He has normal reflexes. No cranial nerve deficit. He exhibits normal muscle tone. Coordination normal.  Skin: Skin is warm and dry. No rash noted. He is not diaphoretic. No erythema. No pallor.  Psychiatric: He has a normal mood and affect. His behavior is normal. Judgment and thought content normal.          Assessment & Plan:  He is doing well. Treat the UTI and we will repeat a UA in 2-3 weeks. Given a shingles shot today and we plan to give him another pneumonia shot in a few weeks.

## 2014-09-11 NOTE — Progress Notes (Signed)
Pre visit review using our clinic review tool, if applicable. No additional management support is needed unless otherwise documented below in the visit note. 

## 2014-09-11 NOTE — Addendum Note (Signed)
Addended by: Aggie Hacker A on: 09/11/2014 05:49 PM   Modules accepted: Orders

## 2014-10-01 ENCOUNTER — Ambulatory Visit (INDEPENDENT_AMBULATORY_CARE_PROVIDER_SITE_OTHER): Payer: Medicare Other | Admitting: Family Medicine

## 2014-10-01 ENCOUNTER — Other Ambulatory Visit (INDEPENDENT_AMBULATORY_CARE_PROVIDER_SITE_OTHER): Payer: Medicare Other

## 2014-10-01 DIAGNOSIS — Z23 Encounter for immunization: Secondary | ICD-10-CM | POA: Diagnosis not present

## 2014-10-01 DIAGNOSIS — R319 Hematuria, unspecified: Secondary | ICD-10-CM | POA: Diagnosis not present

## 2014-10-01 LAB — POCT URINALYSIS DIPSTICK
Bilirubin, UA: NEGATIVE
Glucose, UA: NEGATIVE
Ketones, UA: NEGATIVE
Nitrite, UA: NEGATIVE
Spec Grav, UA: 1.02
Urobilinogen, UA: 0.2
pH, UA: 5

## 2014-10-08 ENCOUNTER — Other Ambulatory Visit: Payer: Self-pay | Admitting: Family Medicine

## 2014-10-12 DIAGNOSIS — I739 Peripheral vascular disease, unspecified: Secondary | ICD-10-CM | POA: Diagnosis not present

## 2014-10-12 DIAGNOSIS — Z6828 Body mass index (BMI) 28.0-28.9, adult: Secondary | ICD-10-CM | POA: Diagnosis not present

## 2014-10-12 DIAGNOSIS — M5126 Other intervertebral disc displacement, lumbar region: Secondary | ICD-10-CM | POA: Diagnosis not present

## 2014-10-15 ENCOUNTER — Ambulatory Visit (HOSPITAL_COMMUNITY)
Admission: RE | Admit: 2014-10-15 | Discharge: 2014-10-15 | Disposition: A | Discharge status: Home / Self Care | Payer: Medicare Other | Source: Ambulatory Visit | Attending: Surgery | Admitting: Surgery

## 2014-10-15 ENCOUNTER — Other Ambulatory Visit (HOSPITAL_COMMUNITY): Payer: Self-pay | Admitting: Neurosurgery

## 2014-10-15 DIAGNOSIS — N401 Enlarged prostate with lower urinary tract symptoms: Secondary | ICD-10-CM | POA: Diagnosis not present

## 2014-10-15 DIAGNOSIS — I739 Peripheral vascular disease, unspecified: Secondary | ICD-10-CM

## 2014-10-17 DIAGNOSIS — M5137 Other intervertebral disc degeneration, lumbosacral region: Secondary | ICD-10-CM | POA: Diagnosis not present

## 2014-10-24 DIAGNOSIS — M5137 Other intervertebral disc degeneration, lumbosacral region: Secondary | ICD-10-CM | POA: Diagnosis not present

## 2014-10-24 DIAGNOSIS — M545 Low back pain: Secondary | ICD-10-CM | POA: Diagnosis not present

## 2014-10-25 DIAGNOSIS — Z6828 Body mass index (BMI) 28.0-28.9, adult: Secondary | ICD-10-CM | POA: Diagnosis not present

## 2014-10-25 DIAGNOSIS — M25551 Pain in right hip: Secondary | ICD-10-CM | POA: Diagnosis not present

## 2014-10-25 DIAGNOSIS — M5126 Other intervertebral disc displacement, lumbar region: Secondary | ICD-10-CM | POA: Diagnosis not present

## 2014-11-19 DIAGNOSIS — M1611 Unilateral primary osteoarthritis, right hip: Secondary | ICD-10-CM | POA: Diagnosis not present

## 2014-11-19 DIAGNOSIS — M5136 Other intervertebral disc degeneration, lumbar region: Secondary | ICD-10-CM | POA: Diagnosis not present

## 2014-11-19 DIAGNOSIS — M961 Postlaminectomy syndrome, not elsewhere classified: Secondary | ICD-10-CM | POA: Diagnosis not present

## 2014-12-03 DIAGNOSIS — H6123 Impacted cerumen, bilateral: Secondary | ICD-10-CM | POA: Diagnosis not present

## 2014-12-04 DIAGNOSIS — M1611 Unilateral primary osteoarthritis, right hip: Secondary | ICD-10-CM | POA: Diagnosis not present

## 2014-12-17 DIAGNOSIS — M1611 Unilateral primary osteoarthritis, right hip: Secondary | ICD-10-CM | POA: Diagnosis not present

## 2014-12-25 DIAGNOSIS — M5126 Other intervertebral disc displacement, lumbar region: Secondary | ICD-10-CM | POA: Diagnosis not present

## 2014-12-25 DIAGNOSIS — M5137 Other intervertebral disc degeneration, lumbosacral region: Secondary | ICD-10-CM | POA: Diagnosis not present

## 2015-01-18 ENCOUNTER — Telehealth: Payer: Self-pay | Admitting: Family Medicine

## 2015-01-18 DIAGNOSIS — E119 Type 2 diabetes mellitus without complications: Secondary | ICD-10-CM

## 2015-01-18 NOTE — Telephone Encounter (Signed)
Pt states he and his wife come for their 6 mo fup and are always allowed to do labs prior to this appt. It is ok to schedule labs prior to the 6 mo fup?

## 2015-01-18 NOTE — Telephone Encounter (Signed)
Labs are ordered 

## 2015-01-21 ENCOUNTER — Other Ambulatory Visit (INDEPENDENT_AMBULATORY_CARE_PROVIDER_SITE_OTHER): Payer: Medicare Other

## 2015-01-21 DIAGNOSIS — E119 Type 2 diabetes mellitus without complications: Secondary | ICD-10-CM

## 2015-01-21 LAB — LIPID PANEL
Cholesterol: 115 mg/dL (ref 0–200)
HDL: 34.2 mg/dL — ABNORMAL LOW (ref >39.00)
LDL Cholesterol: 49 mg/dL (ref 0–99)
NonHDL: 80.8
Total CHOL/HDL Ratio: 3
Triglycerides: 157 mg/dL — ABNORMAL HIGH (ref 0.0–149.0)
VLDL: 31.4 mg/dL (ref 0.0–40.0)

## 2015-01-21 LAB — HEPATIC FUNCTION PANEL
ALT: 17 U/L (ref 0–53)
AST: 15 U/L (ref 0–37)
Albumin: 4 g/dL (ref 3.5–5.2)
Alkaline Phosphatase: 52 U/L (ref 39–117)
Bilirubin, Direct: 0.2 mg/dL (ref 0.0–0.3)
Total Bilirubin: 0.7 mg/dL (ref 0.2–1.2)
Total Protein: 7.2 g/dL (ref 6.0–8.3)

## 2015-01-21 LAB — HEMOGLOBIN A1C: Hgb A1c MFr Bld: 6.9 % — ABNORMAL HIGH (ref 4.6–6.5)

## 2015-02-26 DIAGNOSIS — N138 Other obstructive and reflux uropathy: Secondary | ICD-10-CM | POA: Diagnosis not present

## 2015-02-26 DIAGNOSIS — N401 Enlarged prostate with lower urinary tract symptoms: Secondary | ICD-10-CM | POA: Diagnosis not present

## 2015-03-11 ENCOUNTER — Other Ambulatory Visit: Payer: Self-pay

## 2015-03-12 ENCOUNTER — Encounter: Payer: Self-pay | Admitting: Family Medicine

## 2015-03-12 ENCOUNTER — Ambulatory Visit (INDEPENDENT_AMBULATORY_CARE_PROVIDER_SITE_OTHER): Payer: Medicare Other | Admitting: Family Medicine

## 2015-03-12 ENCOUNTER — Ambulatory Visit: Payer: PRIVATE HEALTH INSURANCE | Admitting: Family Medicine

## 2015-03-12 VITALS — BP 114/66 | HR 65 | Temp 98.2°F | Ht 72.0 in | Wt 204.0 lb

## 2015-03-12 DIAGNOSIS — I10 Essential (primary) hypertension: Secondary | ICD-10-CM

## 2015-03-12 DIAGNOSIS — E785 Hyperlipidemia, unspecified: Secondary | ICD-10-CM | POA: Diagnosis not present

## 2015-03-12 DIAGNOSIS — E119 Type 2 diabetes mellitus without complications: Secondary | ICD-10-CM

## 2015-03-12 MED ORDER — METFORMIN HCL 500 MG PO TABS: 500.0000 mg | ORAL_TABLET | Freq: Two times a day (BID) | ORAL | Status: DC

## 2015-03-12 NOTE — Progress Notes (Signed)
Pre visit review using our clinic review tool, if applicable. No additional management support is needed unless otherwise documented below in the visit note. 

## 2015-03-12 NOTE — Progress Notes (Signed)
   Subjective:    Patient ID: Bobby Lopez, male    DOB: 1941-08-18, 74 y.o.   MRN: [id number]  HPI Here to follow up. He feels great and he has lost some weight. His labs looked great from last month. Is BP is stable. His main concern has been for the health of his wife, as she is currently being treated for acute myeloid leukemia at [facility].    Review of Systems  Constitutional: Negative.   Respiratory: Negative.   Cardiovascular: Negative.        Objective:   Physical Exam  Constitutional: He appears well-developed and well-nourished.  Cardiovascular: Normal rate, regular rhythm, normal heart sounds and intact distal pulses.   Pulmonary/Chest: Effort normal.          Assessment & Plan:  He is doing well with diabetes, dyslipidemia, and HTN. Recheck in 6 months

## 2015-05-15 IMAGING — CR DG CHEST 2V
2 series · 2 of 2 positions shown · non-contrast
Comparison: 10/22/2013

CLINICAL DATA: Preop TURP.  Hypertension, nonsmoker.

EXAM:
CHEST  2 VIEW

[w chest pa *]
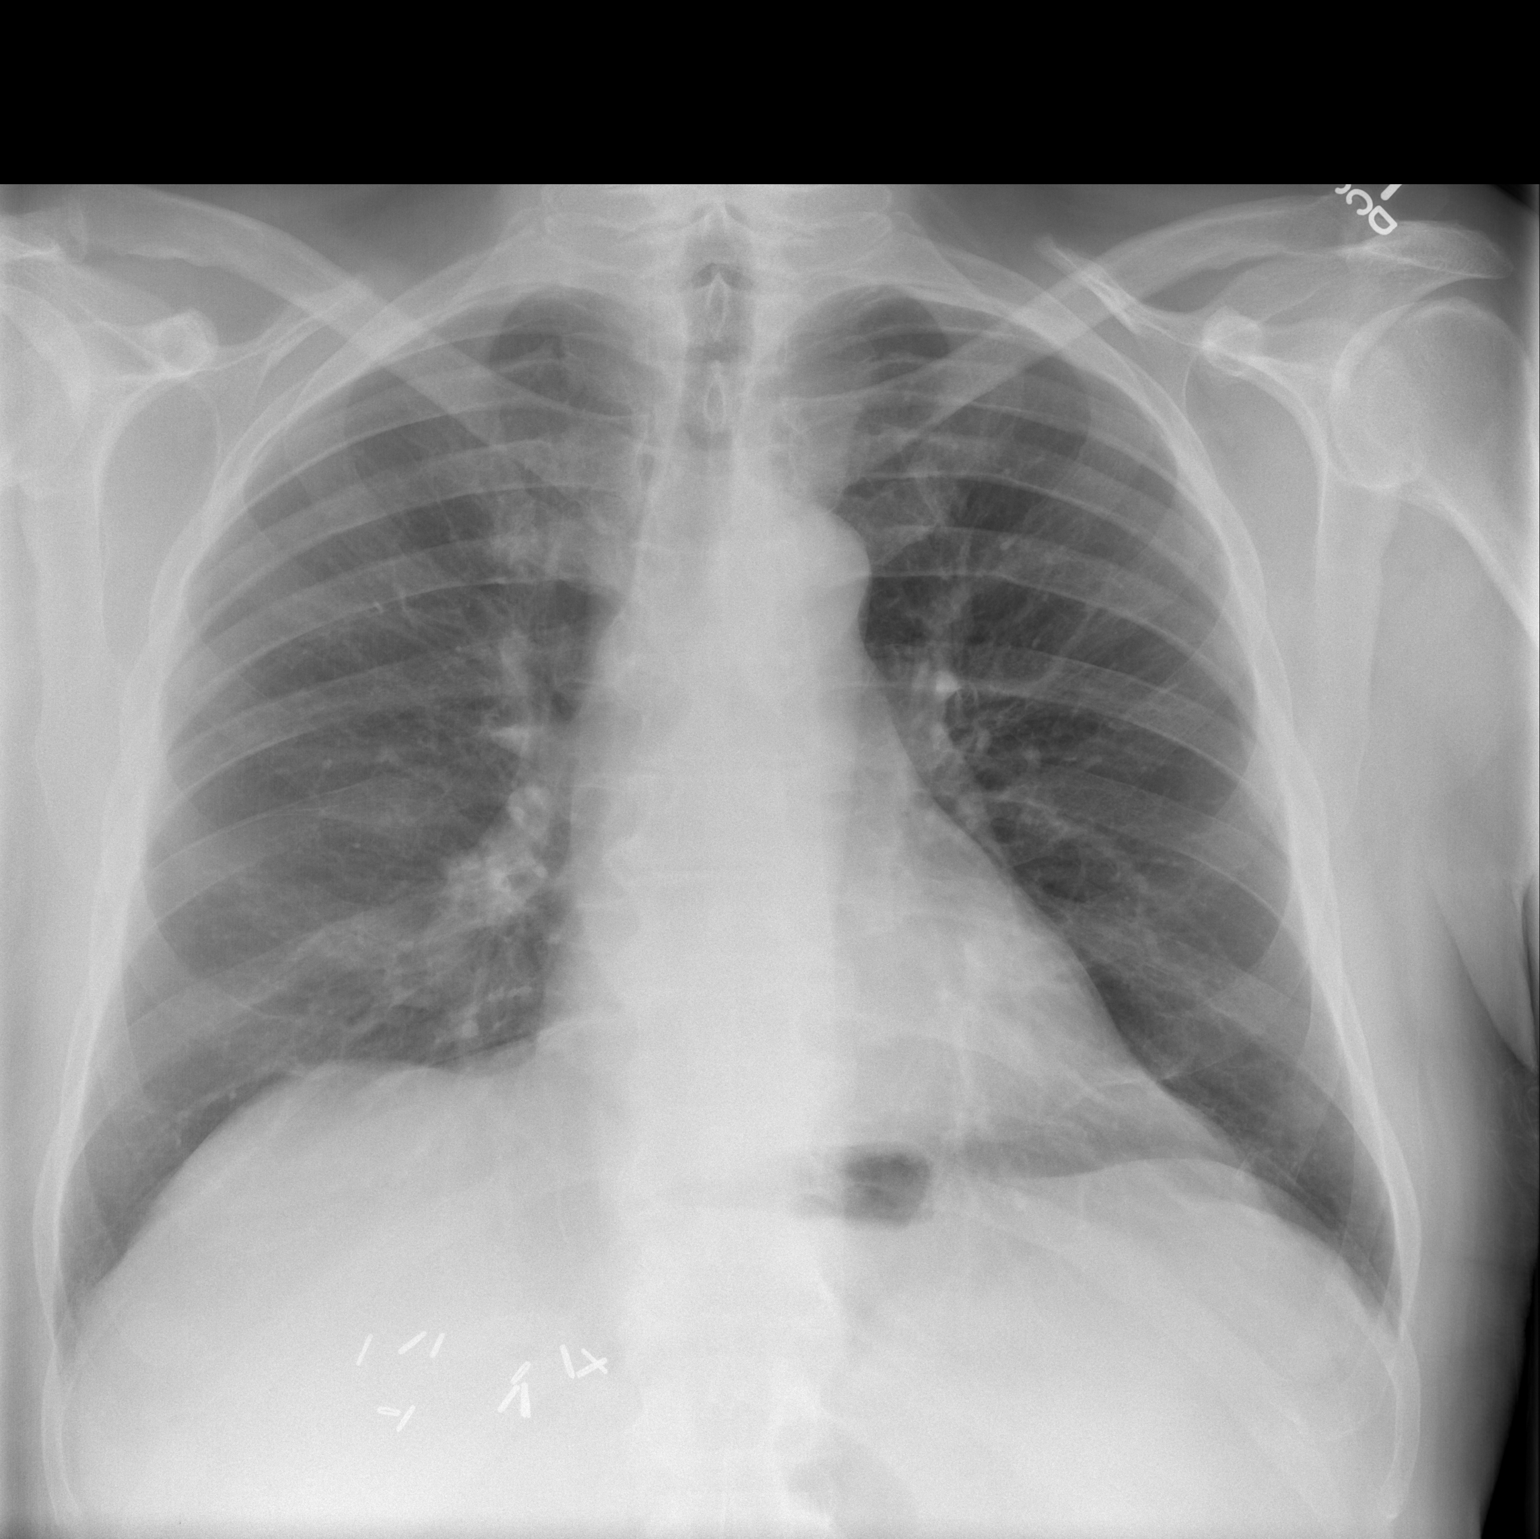

[w chest lat *]
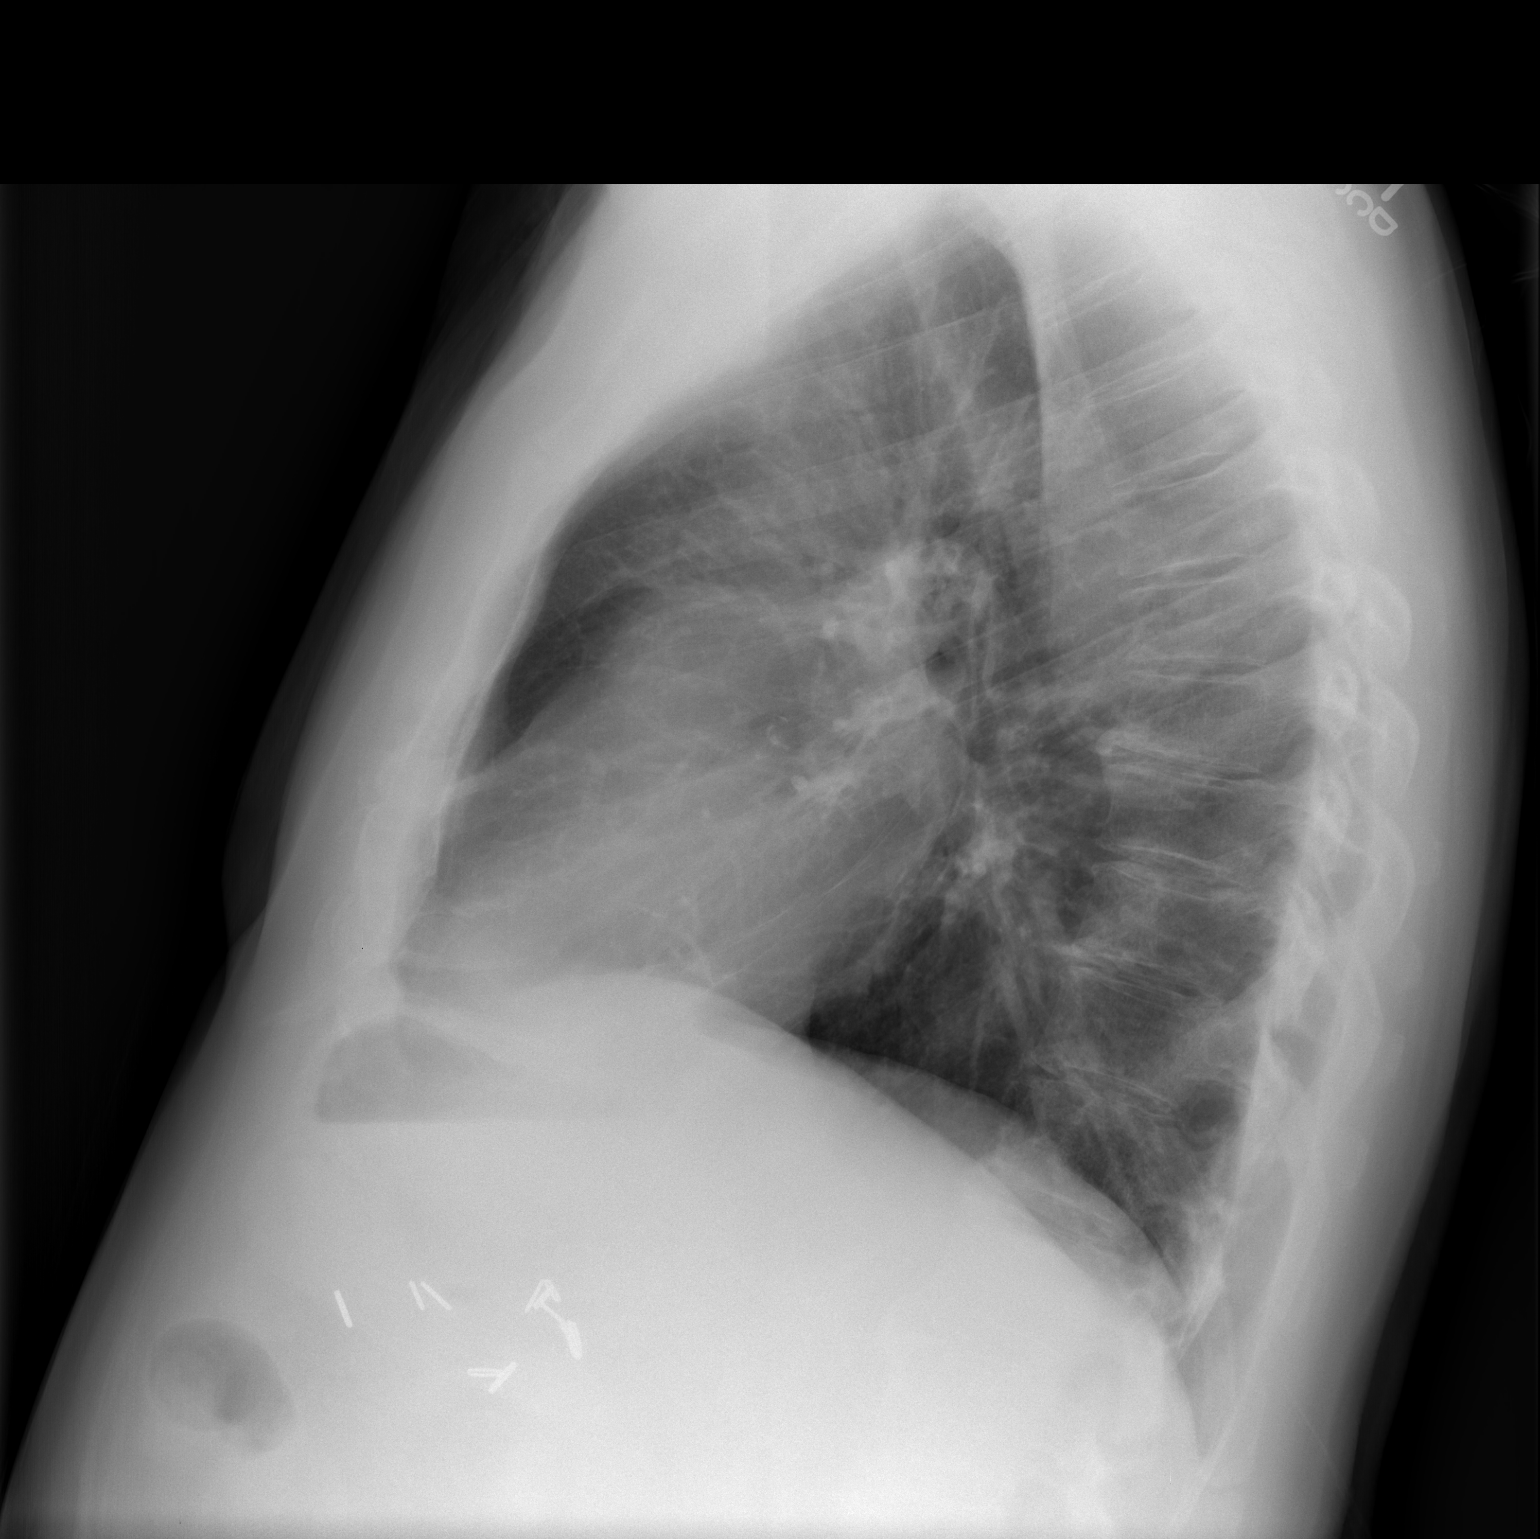

[2 of 2 positions shown; findings below may reference images not displayed]

FINDINGS: The heart size and mediastinal contours are within normal limits.
Both lungs are clear. The visualized skeletal structures are
unremarkable.
IMPRESSION: No active cardiopulmonary disease.

## 2015-05-17 DIAGNOSIS — H6120 Impacted cerumen, unspecified ear: Secondary | ICD-10-CM | POA: Diagnosis not present

## 2015-07-10 ENCOUNTER — Ambulatory Visit (INDEPENDENT_AMBULATORY_CARE_PROVIDER_SITE_OTHER): Payer: Medicare Other | Admitting: Family Medicine

## 2015-07-10 DIAGNOSIS — Z23 Encounter for immunization: Secondary | ICD-10-CM

## 2015-09-05 ENCOUNTER — Other Ambulatory Visit (INDEPENDENT_AMBULATORY_CARE_PROVIDER_SITE_OTHER): Payer: Medicare Other

## 2015-09-05 DIAGNOSIS — Z0000 Encounter for general adult medical examination without abnormal findings: Secondary | ICD-10-CM

## 2015-09-05 DIAGNOSIS — Z125 Encounter for screening for malignant neoplasm of prostate: Secondary | ICD-10-CM | POA: Diagnosis not present

## 2015-09-05 DIAGNOSIS — E119 Type 2 diabetes mellitus without complications: Secondary | ICD-10-CM | POA: Diagnosis not present

## 2015-09-05 LAB — CBC WITH DIFFERENTIAL/PLATELET
Basophils Absolute: 0 10*3/uL (ref 0.0–0.1)
Basophils Relative: 0.6 % (ref 0.0–3.0)
Eosinophils Absolute: 0.1 10*3/uL (ref 0.0–0.7)
Eosinophils Relative: 1.6 % (ref 0.0–5.0)
HCT: 44.3 % (ref 39.0–52.0)
Hemoglobin: 14.7 g/dL (ref 13.0–17.0)
Lymphocytes Relative: 28 % (ref 12.0–46.0)
Lymphs Abs: 2.1 10*3/uL (ref 0.7–4.0)
MCHC: 33.1 g/dL (ref 30.0–36.0)
MCV: 89.6 fl (ref 78.0–100.0)
Monocytes Absolute: 0.6 10*3/uL (ref 0.1–1.0)
Monocytes Relative: 8 % (ref 3.0–12.0)
Neutro Abs: 4.7 10*3/uL (ref 1.4–7.7)
Neutrophils Relative %: 61.8 % (ref 43.0–77.0)
Platelets: 172 10*3/uL (ref 150.0–400.0)
RBC: 4.95 Mil/uL (ref 4.22–5.81)
RDW: 13.3 % (ref 11.5–15.5)
WBC: 7.7 10*3/uL (ref 4.0–10.5)

## 2015-09-05 LAB — LIPID PANEL
Cholesterol: 111 mg/dL (ref 0–200)
HDL: 32 mg/dL — ABNORMAL LOW (ref >39.00)
LDL Cholesterol: 44 mg/dL (ref 0–99)
NonHDL: 78.71
Total CHOL/HDL Ratio: 3
Triglycerides: 174 mg/dL — ABNORMAL HIGH (ref 0.0–149.0)
VLDL: 34.8 mg/dL (ref 0.0–40.0)

## 2015-09-05 LAB — POCT URINALYSIS DIPSTICK
Bilirubin, UA: NEGATIVE
Blood, UA: NEGATIVE
Glucose, UA: NEGATIVE
Ketones, UA: NEGATIVE
Leukocytes, UA: NEGATIVE
Nitrite, UA: NEGATIVE
Protein, UA: NEGATIVE
Spec Grav, UA: >=1.03
Urobilinogen, UA: 0.2
pH, UA: 5.5

## 2015-09-05 LAB — HEPATIC FUNCTION PANEL
ALT: 14 U/L (ref 0–53)
AST: 15 U/L (ref 0–37)
Albumin: 4.1 g/dL (ref 3.5–5.2)
Alkaline Phosphatase: 58 U/L (ref 39–117)
Bilirubin, Direct: 0.1 mg/dL (ref 0.0–0.3)
Total Bilirubin: 0.6 mg/dL (ref 0.2–1.2)
Total Protein: 7 g/dL (ref 6.0–8.3)

## 2015-09-05 LAB — MICROALBUMIN / CREATININE URINE RATIO
Creatinine,U: 240.1 mg/dL
Microalb Creat Ratio: 0.5 mg/g (ref 0.0–30.0)
Microalb, Ur: 1.3 mg/dL (ref 0.0–1.9)

## 2015-09-05 LAB — BASIC METABOLIC PANEL
BUN: 17 mg/dL (ref 6–23)
CO2: 29 mEq/L (ref 19–32)
Calcium: 9.4 mg/dL (ref 8.4–10.5)
Chloride: 101 mEq/L (ref 96–112)
Creatinine, Ser: 0.98 mg/dL (ref 0.40–1.50)
GFR: 79.29 mL/min (ref >60.00)
Glucose, Bld: 158 mg/dL — ABNORMAL HIGH (ref 70–99)
Potassium: 4 mEq/L (ref 3.5–5.1)
Sodium: 141 mEq/L (ref 135–145)

## 2015-09-05 LAB — TSH: TSH: 3.49 u[IU]/mL (ref 0.35–4.50)

## 2015-09-05 LAB — PSA: PSA: 1.39 ng/mL (ref 0.10–4.00)

## 2015-09-05 LAB — HEMOGLOBIN A1C: Hgb A1c MFr Bld: 6.7 % — ABNORMAL HIGH (ref 4.6–6.5)

## 2015-09-13 ENCOUNTER — Ambulatory Visit (INDEPENDENT_AMBULATORY_CARE_PROVIDER_SITE_OTHER): Payer: Medicare Other | Admitting: Family Medicine

## 2015-09-13 ENCOUNTER — Encounter: Payer: Self-pay | Admitting: Family Medicine

## 2015-09-13 VITALS — BP 125/76 | HR 60 | Temp 98.1°F | Ht 72.0 in | Wt 209.0 lb

## 2015-09-13 DIAGNOSIS — I10 Essential (primary) hypertension: Secondary | ICD-10-CM | POA: Diagnosis not present

## 2015-09-13 DIAGNOSIS — E785 Hyperlipidemia, unspecified: Secondary | ICD-10-CM

## 2015-09-13 DIAGNOSIS — E119 Type 2 diabetes mellitus without complications: Secondary | ICD-10-CM

## 2015-09-13 DIAGNOSIS — Z0000 Encounter for general adult medical examination without abnormal findings: Secondary | ICD-10-CM

## 2015-09-13 DIAGNOSIS — M1990 Unspecified osteoarthritis, unspecified site: Secondary | ICD-10-CM

## 2015-09-13 MED ORDER — METFORMIN HCL 500 MG PO TABS: 500.0000 mg | ORAL_TABLET | Freq: Two times a day (BID) | ORAL | Status: DC

## 2015-09-13 MED ORDER — LOSARTAN POTASSIUM-HCTZ 50-12.5 MG PO TABS: 1.0000 | ORAL_TABLET | Freq: Every morning | ORAL | Status: DC

## 2015-09-13 MED ORDER — ATORVASTATIN CALCIUM 20 MG PO TABS: 20.0000 mg | ORAL_TABLET | Freq: Every evening | ORAL | Status: DC

## 2015-09-13 MED ORDER — AMLODIPINE BESYLATE 5 MG PO TABS: 5.0000 mg | ORAL_TABLET | Freq: Every morning | ORAL | Status: DC

## 2015-09-13 NOTE — Progress Notes (Signed)
Pre visit review using our clinic review tool, if applicable. No additional management support is needed unless otherwise documented below in the visit note. 

## 2015-09-13 NOTE — Progress Notes (Signed)
   Subjective:    Patient ID: Bobby Lopez, male    DOB: July 30, 1941, 74 y.o.   MRN: [id number]  HPI 74 yr old male for a well exam. He feels well and has no concerns.    Review of Systems  Constitutional: Negative.   HENT: Negative.   Eyes: Negative.   Respiratory: Negative.   Cardiovascular: Negative.   Gastrointestinal: Negative.   Genitourinary: Negative.   Musculoskeletal: Negative.   Skin: Negative.   Neurological: Negative.   Psychiatric/Behavioral: Negative.        Objective:   Physical Exam  Constitutional: He is oriented to person, place, and time. He appears well-developed and well-nourished. No distress.  HENT:  Head: Normocephalic and atraumatic.  Right Ear: External ear normal.  Left Ear: External ear normal.  Nose: Nose normal.  Mouth/Throat: Oropharynx is clear and moist. No oropharyngeal exudate.  Eyes: Conjunctivae and EOM are normal. Pupils are equal, round, and reactive to light. Right eye exhibits no discharge. Left eye exhibits no discharge. No scleral icterus.  Neck: Neck supple. No JVD present. No tracheal deviation present. No thyromegaly present.  Cardiovascular: Normal rate, regular rhythm, normal heart sounds and intact distal pulses.  Exam reveals no gallop and no friction rub.   No murmur heard. EKG normal   Pulmonary/Chest: Effort normal and breath sounds normal. No respiratory distress. He has no wheezes. He has no rales. He exhibits no tenderness.  Abdominal: Soft. Bowel sounds are normal. He exhibits no distension and no mass. There is no tenderness. There is no rebound and no guarding.  Genitourinary: Penis normal. No penile tenderness.  Musculoskeletal: Normal range of motion. He exhibits no edema or tenderness.  Lymphadenopathy:    He has no cervical adenopathy.  Neurological: He is alert and oriented to person, place, and time. He has normal reflexes. No cranial nerve deficit. He exhibits normal muscle tone. Coordination normal.  Skin:  Skin is warm and dry. No rash noted. He is not diaphoretic. No erythema. No pallor.  Psychiatric: He has a normal mood and affect. His behavior is normal. Judgment and thought content normal.          Assessment & Plan:  Well exam. We discussed diet and exercise.

## 2015-12-03 DIAGNOSIS — H903 Sensorineural hearing loss, bilateral: Secondary | ICD-10-CM | POA: Diagnosis not present

## 2015-12-03 DIAGNOSIS — H6123 Impacted cerumen, bilateral: Secondary | ICD-10-CM | POA: Diagnosis not present

## 2015-12-03 DIAGNOSIS — Z974 Presence of external hearing-aid: Secondary | ICD-10-CM | POA: Diagnosis not present

## 2016-01-03 ENCOUNTER — Telehealth: Payer: Self-pay | Admitting: Family Medicine

## 2016-01-03 NOTE — Telephone Encounter (Signed)
Pt state that his visit for 08/2015 with Dr. Sarajane Jews was coded wrong and he has been dealing with since the beginning of the year.  He came in and gave the information to the person at the front desk and she stated that he had resubmitted the new code, but has now received a past due bill.  Pt stated that they have called [facility] and they state they could not do anything that it has to go through the office that originated the charge.

## 2016-01-07 NOTE — Telephone Encounter (Signed)
Routed patient complaint to billing office for someone to follow up with patient as billing should handle this request.

## 2016-01-29 ENCOUNTER — Encounter: Payer: Self-pay | Admitting: Family Medicine

## 2016-03-10 ENCOUNTER — Other Ambulatory Visit (INDEPENDENT_AMBULATORY_CARE_PROVIDER_SITE_OTHER): Payer: Medicare Other

## 2016-03-10 DIAGNOSIS — Z0000 Encounter for general adult medical examination without abnormal findings: Secondary | ICD-10-CM

## 2016-03-10 LAB — POC URINALSYSI DIPSTICK (AUTOMATED)
Bilirubin, UA: NEGATIVE
Blood, UA: NEGATIVE
Glucose, UA: NEGATIVE
Ketones, UA: NEGATIVE
Leukocytes, UA: NEGATIVE
Nitrite, UA: NEGATIVE
Protein, UA: NEGATIVE
Spec Grav, UA: >=1.03
Urobilinogen, UA: 0.2
pH, UA: 5

## 2016-03-10 LAB — LIPID PANEL
Cholesterol: 108 mg/dL (ref 0–200)
HDL: 32.5 mg/dL — ABNORMAL LOW (ref >39.00)
LDL Cholesterol: 47 mg/dL (ref 0–99)
NonHDL: 75.19
Total CHOL/HDL Ratio: 3
Triglycerides: 139 mg/dL (ref 0.0–149.0)
VLDL: 27.8 mg/dL (ref 0.0–40.0)

## 2016-03-10 LAB — CBC WITH DIFFERENTIAL/PLATELET
Basophils Absolute: 0 10*3/uL (ref 0.0–0.1)
Basophils Relative: 0.6 % (ref 0.0–3.0)
Eosinophils Absolute: 0.1 10*3/uL (ref 0.0–0.7)
Eosinophils Relative: 1.7 % (ref 0.0–5.0)
HCT: 41.7 % (ref 39.0–52.0)
Hemoglobin: 14.2 g/dL (ref 13.0–17.0)
Lymphocytes Relative: 28.2 % (ref 12.0–46.0)
Lymphs Abs: 1.8 10*3/uL (ref 0.7–4.0)
MCHC: 34 g/dL (ref 30.0–36.0)
MCV: 88.8 fl (ref 78.0–100.0)
Monocytes Absolute: 0.6 10*3/uL (ref 0.1–1.0)
Monocytes Relative: 9.2 % (ref 3.0–12.0)
Neutro Abs: 3.9 10*3/uL (ref 1.4–7.7)
Neutrophils Relative %: 60.3 % (ref 43.0–77.0)
Platelets: 168 10*3/uL (ref 150.0–400.0)
RBC: 4.7 Mil/uL (ref 4.22–5.81)
RDW: 13.4 % (ref 11.5–15.5)
WBC: 6.5 10*3/uL (ref 4.0–10.5)

## 2016-03-10 LAB — HEPATIC FUNCTION PANEL
ALT: 14 U/L (ref 0–53)
AST: 14 U/L (ref 0–37)
Albumin: 4.1 g/dL (ref 3.5–5.2)
Alkaline Phosphatase: 48 U/L (ref 39–117)
Bilirubin, Direct: 0.1 mg/dL (ref 0.0–0.3)
Total Bilirubin: 0.7 mg/dL (ref 0.2–1.2)
Total Protein: 6.9 g/dL (ref 6.0–8.3)

## 2016-03-10 LAB — BASIC METABOLIC PANEL
BUN: 14 mg/dL (ref 6–23)
CO2: 30 mEq/L (ref 19–32)
Calcium: 9.4 mg/dL (ref 8.4–10.5)
Chloride: 106 mEq/L (ref 96–112)
Creatinine, Ser: 1.03 mg/dL (ref 0.40–1.50)
GFR: 74.76 mL/min (ref >60.00)
Glucose, Bld: 168 mg/dL — ABNORMAL HIGH (ref 70–99)
Potassium: 4.4 mEq/L (ref 3.5–5.1)
Sodium: 141 mEq/L (ref 135–145)

## 2016-03-10 LAB — HEMOGLOBIN A1C: Hgb A1c MFr Bld: 6.8 % — ABNORMAL HIGH (ref 4.6–6.5)

## 2016-03-10 LAB — PSA: PSA: 1.24 ng/mL (ref 0.10–4.00)

## 2016-03-10 LAB — MICROALBUMIN / CREATININE URINE RATIO
Creatinine,U: 232.1 mg/dL
Microalb Creat Ratio: 0.5 mg/g (ref 0.0–30.0)
Microalb, Ur: 1.2 mg/dL (ref 0.0–1.9)

## 2016-03-10 LAB — TSH: TSH: 3.06 u[IU]/mL (ref 0.35–4.50)

## 2016-04-09 ENCOUNTER — Ambulatory Visit (INDEPENDENT_AMBULATORY_CARE_PROVIDER_SITE_OTHER): Payer: Medicare Other | Admitting: Family Medicine

## 2016-04-09 VITALS — BP 118/68 | HR 71 | Temp 97.8°F

## 2016-04-09 DIAGNOSIS — S91331A Puncture wound without foreign body, right foot, initial encounter: Secondary | ICD-10-CM | POA: Diagnosis not present

## 2016-04-09 NOTE — Progress Notes (Signed)
Pre visit review using our clinic review tool, if applicable. No additional management support is needed unless otherwise documented below in the visit note. Pt unable to weigh 

## 2016-04-10 DIAGNOSIS — Z23 Encounter for immunization: Secondary | ICD-10-CM | POA: Diagnosis not present

## 2016-04-10 DIAGNOSIS — S91331A Puncture wound without foreign body, right foot, initial encounter: Secondary | ICD-10-CM | POA: Diagnosis not present

## 2016-04-10 NOTE — Addendum Note (Signed)
Addended by: Aggie Hacker A on: 04/10/2016 10:03 AM   Modules accepted: Orders

## 2016-04-10 NOTE — Progress Notes (Signed)
   Subjective:    Patient ID: Bobby Lopez, male    DOB: [birth year], 75 y.o.   MRN: [id number]  HPI Here for an injury that occurred at home yesterday. When getting out of is car he stepped on a roofing nail in his driveway. They cleaned the wound with peroxide and alcohol and applied a Bandaid. Today it is slight tender only.    Review of Systems  Constitutional: Negative.   Skin: Positive for wound.       Objective:   Physical Exam  Constitutional: He appears well-developed and well-nourished. No distress.  Skin:  Small puncture wound to the bottom of the right foot. Mildly tender           Assessment & Plan:  Puncture wound. Given a TDaP. Treat with Keflex for 7 days. Laurey Morale, MD

## 2016-05-28 DIAGNOSIS — Z974 Presence of external hearing-aid: Secondary | ICD-10-CM | POA: Diagnosis not present

## 2016-05-28 DIAGNOSIS — H6123 Impacted cerumen, bilateral: Secondary | ICD-10-CM | POA: Diagnosis not present

## 2016-05-28 DIAGNOSIS — H903 Sensorineural hearing loss, bilateral: Secondary | ICD-10-CM | POA: Diagnosis not present

## 2016-06-05 ENCOUNTER — Ambulatory Visit (INDEPENDENT_AMBULATORY_CARE_PROVIDER_SITE_OTHER): Payer: Medicare Other | Admitting: Family Medicine

## 2016-06-05 DIAGNOSIS — Z23 Encounter for immunization: Secondary | ICD-10-CM | POA: Diagnosis not present

## 2016-09-03 ENCOUNTER — Other Ambulatory Visit: Payer: Self-pay | Admitting: Family Medicine

## 2016-09-11 DIAGNOSIS — E119 Type 2 diabetes mellitus without complications: Secondary | ICD-10-CM | POA: Diagnosis not present

## 2016-10-02 ENCOUNTER — Encounter: Payer: Self-pay | Admitting: Family Medicine

## 2016-10-02 ENCOUNTER — Ambulatory Visit (INDEPENDENT_AMBULATORY_CARE_PROVIDER_SITE_OTHER): Payer: Medicare PPO | Admitting: Family Medicine

## 2016-10-02 VITALS — BP 129/81 | HR 63 | Temp 98.3°F | Ht 72.0 in | Wt 201.0 lb

## 2016-10-02 DIAGNOSIS — E119 Type 2 diabetes mellitus without complications: Secondary | ICD-10-CM | POA: Diagnosis not present

## 2016-10-02 DIAGNOSIS — Z125 Encounter for screening for malignant neoplasm of prostate: Secondary | ICD-10-CM | POA: Diagnosis not present

## 2016-10-02 DIAGNOSIS — Z0000 Encounter for general adult medical examination without abnormal findings: Secondary | ICD-10-CM

## 2016-10-02 LAB — POC URINALSYSI DIPSTICK (AUTOMATED)
Bilirubin, UA: NEGATIVE
Blood, UA: NEGATIVE
Glucose, UA: NEGATIVE
Ketones, UA: NEGATIVE
Leukocytes, UA: NEGATIVE
Nitrite, UA: NEGATIVE
Spec Grav, UA: >=1.03
Urobilinogen, UA: 0.2
pH, UA: 5.5

## 2016-10-02 LAB — BASIC METABOLIC PANEL
BUN: 16 mg/dL (ref 6–23)
CO2: 29 mEq/L (ref 19–32)
Calcium: 9.7 mg/dL (ref 8.4–10.5)
Chloride: 105 mEq/L (ref 96–112)
Creatinine, Ser: 1.11 mg/dL (ref 0.40–1.50)
GFR: 68.48 mL/min (ref >60.00)
Glucose, Bld: 162 mg/dL — ABNORMAL HIGH (ref 70–99)
Potassium: 4.6 mEq/L (ref 3.5–5.1)
Sodium: 141 mEq/L (ref 135–145)

## 2016-10-02 LAB — CBC WITH DIFFERENTIAL/PLATELET
Basophils Absolute: 0 10*3/uL (ref 0.0–0.1)
Basophils Relative: 0.4 % (ref 0.0–3.0)
Eosinophils Absolute: 0.1 10*3/uL (ref 0.0–0.7)
Eosinophils Relative: 1.3 % (ref 0.0–5.0)
HCT: 45.2 % (ref 39.0–52.0)
Hemoglobin: 15.4 g/dL (ref 13.0–17.0)
Lymphocytes Relative: 25.1 % (ref 12.0–46.0)
Lymphs Abs: 2.2 10*3/uL (ref 0.7–4.0)
MCHC: 34 g/dL (ref 30.0–36.0)
MCV: 89.5 fl (ref 78.0–100.0)
Monocytes Absolute: 0.8 10*3/uL (ref 0.1–1.0)
Monocytes Relative: 9.2 % (ref 3.0–12.0)
Neutro Abs: 5.7 10*3/uL (ref 1.4–7.7)
Neutrophils Relative %: 64 % (ref 43.0–77.0)
Platelets: 185 10*3/uL (ref 150.0–400.0)
RBC: 5.05 Mil/uL (ref 4.22–5.81)
RDW: 13 % (ref 11.5–15.5)
WBC: 8.9 10*3/uL (ref 4.0–10.5)

## 2016-10-02 LAB — LIPID PANEL
Cholesterol: 110 mg/dL (ref 0–200)
HDL: 37.7 mg/dL — ABNORMAL LOW (ref >39.00)
LDL Cholesterol: 51 mg/dL (ref 0–99)
NonHDL: 72.09
Total CHOL/HDL Ratio: 3
Triglycerides: 107 mg/dL (ref 0.0–149.0)
VLDL: 21.4 mg/dL (ref 0.0–40.0)

## 2016-10-02 LAB — TSH: TSH: 2.54 u[IU]/mL (ref 0.35–4.50)

## 2016-10-02 LAB — HEPATIC FUNCTION PANEL
ALT: 15 U/L (ref 0–53)
AST: 14 U/L (ref 0–37)
Albumin: 4.4 g/dL (ref 3.5–5.2)
Alkaline Phosphatase: 54 U/L (ref 39–117)
Bilirubin, Direct: 0.2 mg/dL (ref 0.0–0.3)
Total Bilirubin: 1.2 mg/dL (ref 0.2–1.2)
Total Protein: 7.6 g/dL (ref 6.0–8.3)

## 2016-10-02 LAB — PSA: PSA: 1.68 ng/mL (ref 0.10–4.00)

## 2016-10-02 LAB — HEMOGLOBIN A1C: Hgb A1c MFr Bld: 6.7 % — ABNORMAL HIGH (ref 4.6–6.5)

## 2016-10-02 MED ORDER — ATORVASTATIN CALCIUM 20 MG PO TABS: 20.0000 mg | ORAL_TABLET | Freq: Every evening | ORAL | 3 refills | Status: DC

## 2016-10-02 MED ORDER — LOSARTAN POTASSIUM-HCTZ 50-12.5 MG PO TABS: 1.0000 | ORAL_TABLET | Freq: Every morning | ORAL | 3 refills | Status: DC

## 2016-10-02 MED ORDER — METFORMIN HCL 500 MG PO TABS: 500.0000 mg | ORAL_TABLET | Freq: Two times a day (BID) | ORAL | 3 refills | Status: DC

## 2016-10-02 MED ORDER — AMLODIPINE BESYLATE 5 MG PO TABS: 5.0000 mg | ORAL_TABLET | Freq: Every morning | ORAL | 3 refills | Status: DC

## 2016-10-02 NOTE — Progress Notes (Signed)
   Subjective:    Patient ID: Bobby Lopez, male    DOB: 1941-07-21, 76 y.o.   MRN: [id number]  HPI 76 yr old male for a well exam. He has been doing well although he is worried about his wife's medical conditions.    Review of Systems  Constitutional: Negative.   HENT: Negative.   Eyes: Negative.   Respiratory: Negative.   Cardiovascular: Negative.   Gastrointestinal: Negative.   Genitourinary: Negative.   Musculoskeletal: Negative.   Skin: Negative.   Neurological: Negative.   Psychiatric/Behavioral: Negative.        Objective:   Physical Exam  Constitutional: He is oriented to person, place, and time. He appears well-developed and well-nourished. No distress.  HENT:  Head: Normocephalic and atraumatic.  Right Ear: External ear normal.  Left Ear: External ear normal.  Nose: Nose normal.  Mouth/Throat: Oropharynx is clear and moist. No oropharyngeal exudate.  Eyes: Conjunctivae and EOM are normal. Pupils are equal, round, and reactive to light. Right eye exhibits no discharge. Left eye exhibits no discharge. No scleral icterus.  Neck: Neck supple. No JVD present. No tracheal deviation present. No thyromegaly present.  Cardiovascular: Normal rate, regular rhythm, normal heart sounds and intact distal pulses.  Exam reveals no gallop and no friction rub.   No murmur heard. Pulmonary/Chest: Effort normal and breath sounds normal. No respiratory distress. He has no wheezes. He has no rales. He exhibits no tenderness.  Abdominal: Soft. Bowel sounds are normal. He exhibits no distension and no mass. There is no tenderness. There is no rebound and no guarding.  Genitourinary: Rectum normal, prostate normal and penis normal. Rectal exam shows guaiac negative stool. No penile tenderness.  Musculoskeletal: Normal range of motion. He exhibits no edema or tenderness.  Lymphadenopathy:    He has no cervical adenopathy.  Neurological: He is alert and oriented to person, place, and time.  He has normal reflexes. No cranial nerve deficit. He exhibits normal muscle tone. Coordination normal.  Skin: Skin is warm and dry. No rash noted. He is not diaphoretic. No erythema. No pallor.  Psychiatric: He has a normal mood and affect. His behavior is normal. Judgment and thought content normal.          Assessment & Plan:  Well exam. We discussed diet and exercise. Get fasting labs today. He is due for a colonoscopy, but we agreed to postpone this for awhile during his wife's current medical problems.  Alysia Penna, MD

## 2016-10-02 NOTE — Progress Notes (Signed)
Pre visit review using our clinic review tool, if applicable. No additional management support is needed unless otherwise documented below in the visit note. 

## 2017-02-02 ENCOUNTER — Encounter: Payer: Self-pay | Admitting: Gastroenterology

## 2017-03-10 ENCOUNTER — Ambulatory Visit (AMBULATORY_SURGERY_CENTER): Payer: Self-pay

## 2017-03-10 ENCOUNTER — Encounter: Payer: Self-pay | Admitting: Gastroenterology

## 2017-03-10 VITALS — Ht 72.0 in | Wt 201.8 lb

## 2017-03-10 DIAGNOSIS — Z1211 Encounter for screening for malignant neoplasm of colon: Secondary | ICD-10-CM

## 2017-03-10 MED ORDER — NA SULFATE-K SULFATE-MG SULF 17.5-3.13-1.6 GM/177ML PO SOLN: 1.0000 | Freq: Once | ORAL | 0 refills | Status: AC

## 2017-03-10 NOTE — Progress Notes (Signed)
Denies allergies to eggs or soy products. Denies complication of anesthesia or sedation. Denies use of weight loss medication. Denies use of O2.   Emmi instructions declined. Patient does not use computers. 

## 2017-03-24 ENCOUNTER — Ambulatory Visit (AMBULATORY_SURGERY_CENTER): Payer: Medicare PPO | Admitting: Gastroenterology

## 2017-03-24 ENCOUNTER — Encounter: Payer: Self-pay | Admitting: Gastroenterology

## 2017-03-24 VITALS — BP 127/77 | HR 53 | Temp 98.0°F | Resp 15 | Ht 72.0 in | Wt 201.0 lb

## 2017-03-24 DIAGNOSIS — Z1211 Encounter for screening for malignant neoplasm of colon: Secondary | ICD-10-CM | POA: Diagnosis not present

## 2017-03-24 DIAGNOSIS — D124 Benign neoplasm of descending colon: Secondary | ICD-10-CM | POA: Diagnosis not present

## 2017-03-24 DIAGNOSIS — D123 Benign neoplasm of transverse colon: Secondary | ICD-10-CM

## 2017-03-24 DIAGNOSIS — Z1212 Encounter for screening for malignant neoplasm of rectum: Secondary | ICD-10-CM

## 2017-03-24 HISTORY — PX: COLONOSCOPY: SHX174

## 2017-03-24 MED ORDER — SODIUM CHLORIDE 0.9 % IV SOLN: 500.0000 mL | INTRAVENOUS | Status: AC

## 2017-03-24 NOTE — Op Note (Signed)
Bobby Lopez: Bobby Lopez Procedure Date: 03/24/2017 9:11 AM MRN: [id number] Endoscopist: Mauri Pole , MD Age: 76 Referring MD:  Date of Birth: Mar 04, 1941 Gender: Male Account #: [id number] Procedure:                Colonoscopy Indications:              Screening for colorectal malignant neoplasm Medicines:                Monitored Anesthesia Care Procedure:                Pre-Anesthesia Assessment:                           - Prior to the procedure, a History and Physical                            was performed, and patient medications and                            allergies were reviewed. The patient's tolerance of                            previous anesthesia was also reviewed. The risks                            and benefits of the procedure and the sedation                            options and risks were discussed with the patient.                            All questions were answered, and informed consent                            was obtained. Prior Anticoagulants: The patient has                            taken no previous anticoagulant or antiplatelet                            agents. ASA Grade Assessment: II - A patient with                            mild systemic disease. After reviewing the risks                            and benefits, the patient was deemed in                            satisfactory condition to undergo the procedure.                           After obtaining informed consent, the colonoscope  was passed under direct vision. Throughout the                            procedure, the patient's blood pressure, pulse, and                            oxygen saturations were monitored continuously. The                            Colonoscope was introduced through the anus and                            advanced to the the cecum, identified by                            appendiceal orifice and  ileocecal valve. The                            colonoscopy was performed without difficulty. The                            patient tolerated the procedure well. The quality                            of the bowel preparation was excellent. The                            terminal ileum, ileocecal valve, appendiceal                            orifice, and rectum were photographed. Scope In: 9:14:14 AM Scope Out: 9:27:45 AM Scope Withdrawal Time: 0 hours 11 minutes 42 seconds  Total Procedure Duration: 0 hours 13 minutes 31 seconds  Findings:                 The perianal and digital rectal examinations were                            normal.                           A 2 mm polyp was found in the transverse colon. The                            polyp was sessile. The polyp was removed with a                            cold biopsy forceps. Resection and retrieval were                            complete.                           Two sessile polyps were found in the descending  colon and transverse colon. The polyps were 4 to 5                            mm in size. These polyps were removed with a cold                            snare. Resection and retrieval were complete.                           Non-bleeding internal hemorrhoids were found during                            retroflexion. The hemorrhoids were medium-sized.                           A few small-mouthed diverticula were found in the                            sigmoid colon, descending colon and transverse                            colon. Complications:            No immediate complications. Estimated Blood Loss:     Estimated blood loss was minimal. Impression:               - One 2 mm polyp in the transverse colon, removed                            with a cold biopsy forceps. Resected and retrieved.                           - Two 4 to 5 mm polyps in the descending colon and                             in the transverse colon, removed with a cold snare.                            Resected and retrieved.                           - Non-bleeding internal hemorrhoids.                           - Diverticulosis in the sigmoid colon, in the                            descending colon and in the transverse colon. Recommendation:           - Patient has a contact number available for                            emergencies. The signs and symptoms of potential  delayed complications were discussed with the                            patient. Return to normal activities tomorrow.                            Written discharge instructions were provided to the                            patient.                           - Resume previous diet.                           - Continue present medications.                           - Await pathology results.                           - Repeat colonoscopy in 3 - 5 years for                            surveillance based on pathology results. Mauri Pole, MD 03/24/2017 9:31:41 AM This report has been signed electronically.

## 2017-03-24 NOTE — Progress Notes (Signed)
Called to room to assist during endoscopic procedure.  Patient ID and intended procedure confirmed with present staff. Received instructions for my participation in the procedure from the performing physician.  

## 2017-03-24 NOTE — Patient Instructions (Signed)
YOU HAD AN ENDOSCOPIC PROCEDURE TODAY AT THE [facility]:   Refer to the procedure report that was given to you for any specific questions about what was found during the examination.  If the procedure report does not answer your questions, please call your gastroenterologist to clarify.  If you requested that your care partner not be given the details of your procedure findings, then the procedure report has been included in a sealed envelope for you to review at your convenience later.  YOU SHOULD EXPECT: Some feelings of bloating in the abdomen. Passage of more gas than usual.  Walking can help get rid of the air that was put into your GI tract during the procedure and reduce the bloating. If you had a lower endoscopy (such as a colonoscopy or flexible sigmoidoscopy) you may notice spotting of blood in your stool or on the toilet paper. If you underwent a bowel prep for your procedure, you may not have a normal bowel movement for a few days.  Please Note:  You might notice some irritation and congestion in your nose or some drainage.  This is from the oxygen used during your procedure.  There is no need for concern and it should clear up in a day or so.  SYMPTOMS TO REPORT IMMEDIATELY:   Following lower endoscopy (colonoscopy or flexible sigmoidoscopy):  Excessive amounts of blood in the stool  Significant tenderness or worsening of abdominal pains  Swelling of the abdomen that is new, acute  Fever of 100F or higher  For urgent or emergent issues, a gastroenterologist can be reached at any hour by calling [phone].   DIET:  We do recommend a small meal at first, but then you may proceed to your regular diet.  Drink plenty of fluids but you should avoid alcoholic beverages for 24 hours.  ACTIVITY:  You should plan to take it easy for the rest of today and you should NOT DRIVE or use heavy machinery until tomorrow (because of the sedation medicines used during the test).     FOLLOW UP: Our staff will call the number listed on your records the next business day following your procedure to check on you and address any questions or concerns that you may have regarding the information given to you following your procedure. If we do not reach you, we will leave a message.  However, if you are feeling well and you are not experiencing any problems, there is no need to return our call.  We will assume that you have returned to your regular daily activities without incident.  If any biopsies were taken you will be contacted by phone or by letter within the next 1-3 weeks.  Please call us at [phone] if you have not heard about the biopsies in 3 weeks.    SIGNATURES/CONFIDENTIALITY: You and/or your care partner have signed paperwork which will be entered into your electronic medical record.  These signatures attest to the fact that that the information above on your After Visit Summary has been reviewed and is understood.  Full responsibility of the confidentiality of this discharge information lies with you and/or your care-partner. 

## 2017-03-24 NOTE — Progress Notes (Signed)
Pt's states no medical or surgical changes since previsit or office visit. 

## 2017-03-24 NOTE — Progress Notes (Signed)
Alert and oriented x3, pleased with MAC, report to RN  Opal Sidles

## 2017-03-25 ENCOUNTER — Telehealth: Payer: Self-pay | Admitting: *Deleted

## 2017-03-25 NOTE — Telephone Encounter (Signed)
  Follow up Call-  Call back number 03/24/2017  Post procedure Call Back phone  # [phone] or [phone] wife  Permission to leave phone message Yes  Some recent data might be hidden     Patient questions:  Do you have a fever, pain , or abdominal swelling? No. Pain Score  0 *  Have you tolerated food without any problems? Yes.    Have you been able to return to your normal activities? Yes.    Do you have any questions about your discharge instructions: Diet   No. Medications  No. Follow up visit  No.  Do you have questions or concerns about your Care? No.  Actions: * If pain score is 4 or above: No action needed, pain <4.

## 2017-04-02 ENCOUNTER — Encounter: Payer: Self-pay | Admitting: Gastroenterology

## 2017-08-09 ENCOUNTER — Telehealth: Payer: Self-pay | Admitting: Family Medicine

## 2017-08-09 DIAGNOSIS — K649 Unspecified hemorrhoids: Secondary | ICD-10-CM

## 2017-08-09 NOTE — Telephone Encounter (Signed)
Copied from [place]. Topic: Referral - Request >> Aug 06, 2017  8:38 AM Lennox Solders wrote: Reason for CRM: Pt wife is requesting a referral for her husband to see general surgeon for bleeding hemorroids

## 2017-08-09 NOTE — Telephone Encounter (Signed)
Sent to PCP to set up pt's referral

## 2017-08-09 NOTE — Telephone Encounter (Signed)
The referral was done  

## 2017-08-09 NOTE — Telephone Encounter (Signed)
Called and left a VM that referral was done. Advised pt to call back if they had any further questions.

## 2017-08-10 ENCOUNTER — Telehealth: Payer: Self-pay | Admitting: Family Medicine

## 2017-08-10 NOTE — Telephone Encounter (Signed)
Copied from [place]. Topic: Referral - Status >> Aug 09, 2017  3:34 PM Neva Seat wrote: Pt's wife called back from missed call. - Referral  Please call her (pt's wife) back Cell: [phone]

## 2017-08-11 NOTE — Telephone Encounter (Signed)
Spoke to pt she stated that she handled this issues and is scheduled for her referral.

## 2017-09-22 ENCOUNTER — Other Ambulatory Visit: Payer: Self-pay | Admitting: Family Medicine

## 2017-09-28 ENCOUNTER — Other Ambulatory Visit: Payer: Self-pay | Admitting: Family Medicine

## 2017-10-05 NOTE — Progress Notes (Addendum)
Subjective:   Bobby Lopez is a 77 y.o. male who presents for an Initial Medicare Annual Wellness Visit.  Seeing Dr. Sarajane Jews today Reports health as good but wife is undergoing transplant  Diet A1c 6.7 BS 162  Exercise HDL 37 Former smoker; quit 74;  CT of the abd 07/2012; no abd intra-abdominal abnormalities Walks the dog every am  Walks 6 miles a day; 2 hours   Colonoscopy 03/2017  Health Maintenance Due  Topic Date Due  . OPHTHALMOLOGY EXAM  08/25/2015  . HEMOGLOBIN A1C  04/01/2017   Declines foot exam;  States his wife cuts his toenails and he has not issues with his feet.,   Eye exams coming up soon; Recommended one every year   [facility]   Zoster 08/2014  Educated regarding shingrix  Here x 16 years Worked 65 years in [place] [employer]  Wife moved here sooner due to job  She is recovering from illness    Objective:    Today's Vitals   10/06/17 1133  BP: 138/60  Weight: 204 lb (92.5 kg)  Height: 5\' 10"  (1.778 m)   Body mass index is 29.27 kg/m.  Advanced Directives 10/06/2017 03/10/2017 07/20/2014 07/13/2014 10/23/2013 10/19/2013 10/19/2013  Does Patient Have a Medical Advance Directive? Yes Yes Yes Yes Patient has advance directive, copy in chart Patient has advance directive, copy in chart Patient does not have advance directive  Type of Advance Directive - [place];Living will Living will Living will - - -  Does patient want to make changes to medical advance directive? - - - - No No -  Copy of [place] in Chart? - - Yes Yes - - -  Pre-existing out of facility DNR order (yellow form or pink MOST form) - - - - - - -    Current Medications (verified) Outpatient Encounter Medications as of 10/06/2017  Medication Sig  . amLODipine (NORVASC) 5 MG tablet TAKE ONE TABLET BY MOUTH EVERY MORNING  . aspirin 81 MG tablet Take 162 mg by mouth daily.  Marland Kitchen atorvastatin (LIPITOR) 20 MG tablet TAKE ONE TABLET BY MOUTH  EVERY EVENING  . docusate sodium (COLACE) 100 MG capsule Take 1 capsule (100 mg total) by mouth 2 (two) times daily as needed (take to keep stool soft.). (Patient taking differently: Take 100 mg by mouth daily. )  . losartan-hydrochlorothiazide (HYZAAR) 50-12.5 MG tablet TAKE ONE TABLET BY MOUTH EVERY MORNING  . metFORMIN (GLUCOPHAGE) 500 MG tablet TAKE ONE TABLET BY MOUTH TWICE A DAY WITH A MEAL  . Multiple Vitamin (MULTIVITAMIN WITH MINERALS) TABS tablet Take 1 tablet by mouth daily. Centrum silver  . Omega-3 Fatty Acids (FISH OIL PO) Take by mouth daily.  . Probiotic Product (ALIGN PO) Take 1 tablet by mouth daily.    Facility-Administered Encounter Medications as of 10/06/2017  Medication  . 0.9 %  sodium chloride infusion    Allergies (verified) Ace inhibitors and Hydrocodone   History: Past Medical History:  Diagnosis Date  . Arthritis    Back  . Bladder stones    NEEDS TO HAVE SURGERY  . BPH (benign prostatic hyperplasia)   . Diabetes mellitus    type  II  . DVT (deep venous thrombosis) ([place])   . Eczema   . Hyperlipidemia   . Hypertension   . Lumbar herniated disc    Past Surgical History:  Procedure Laterality Date  . CHOLECYSTECTOMY    . COLONOSCOPY  03/24/2017  per Dr. Silverio Decamp, adenomatous polyps, no repeats due to age   . CYSTOSCOPY W/ RETROGRADES Bilateral 07/20/2014   Procedure: CYSTOLITHALOPAXY;  Surgeon: Ardis Hughs, MD;  Location: [facility];  Service: Urology;  Laterality: Bilateral;  . HOLMIUM LASER APPLICATION N/A 73/01/3298   Procedure: HOLMIUM LASER APPLICATION;  Surgeon: Ardis Hughs, MD;  Location: [facility];  Service: Urology;  Laterality: N/A;  . INSERTION OF MESH N/A 10/19/2013   Procedure: INSERTION OF MESH;  Surgeon: Merrie Roof, MD;  Location: [facility];  Service: General;  Laterality: N/A;  . LUMBAR LAMINECTOMY/DECOMPRESSION MICRODISCECTOMY  09/26/2012   Procedure: LUMBAR LAMINECTOMY/DECOMPRESSION MICRODISCECTOMY 1 LEVEL;  Surgeon: Elaina Hoops, MD;  Location: [facility];  Service: Neurosurgery;  Laterality: Left;  Lumbar Lamiectomy Extraforaminal Diskectomy Lumbar Three-Four Left  . TRANSURETHRAL RESECTION OF PROSTATE N/A 07/20/2014   Procedure: TRANSURETHRAL RESECTION OF THE PROSTATE (TURP) WITH GYRUS;  Surgeon: Ardis Hughs, MD;  Location: [facility];  Service: Urology;  Laterality: N/A;  . VENTRAL HERNIA REPAIR  10/19/2013   DR TOTH  . VENTRAL HERNIA REPAIR N/A 10/19/2013   Procedure: LAPAROSCOPIC VENTRAL HERNIA;  Surgeon: Merrie Roof, MD;  Location: [facility];  Service: General;  Laterality: N/A;   Family History  Problem Relation Age of Onset  . Diabetes Mother   . Hypertension Mother   . Esophageal cancer Sister   . Colon cancer Neg Hx   . Rectal cancer Neg Hx   . Stomach cancer Neg Hx    Social History   Socioeconomic History  . Marital status: Married    Spouse name: None  . Number of children: None  . Years of education: None  . Highest education level: None  Social Needs  . Financial resource strain: None  . Food insecurity - worry: None  . Food insecurity - inability: None  . Transportation needs - medical: None  . Transportation needs - non-medical: None  Occupational History  . None  Tobacco Use  . Smoking status: Former Smoker    Last attempt to quit: 07/13/1973    Years since quitting: 44.2  . Smokeless tobacco: Never Used  . Tobacco comment: quit > 46 years ago  Substance and Sexual Activity  . Alcohol use: No    Alcohol/week: 0.0 oz  . Drug use: No  . Sexual activity: None  Other Topics Concern  . None  Social History Narrative  . None   Tobacco Counseling Counseling given: Yes Comment: quit > 46 years ago   Clinical Intake:   Activities of Daily Living In your present state of health, do you have any difficulty performing the following activities: 10/06/2017  Hearing? Y  Vision? N  Difficulty concentrating or making decisions? N  Walking or climbing stairs? N  Dressing or  bathing? N  Doing errands, shopping? N  Preparing Food and eating ? N  Using the Toilet? N  In the past six months, have you accidently leaked urine? N  Do you have problems with loss of bowel control? N  Managing your Medications? N  Managing your Finances? N  Housekeeping or managing your Housekeeping? N  Some recent data might be hidden     Immunizations and Health Maintenance Immunization History  Administered Date(s) Administered  . Influenza Split 06/20/2012, 06/01/2013  . Influenza Whole 06/25/2008, 06/18/2009  . Influenza, High Dose Seasonal PF 07/10/2015, 06/05/2016  . Influenza-Unspecified 06/11/2014, 06/14/2017  . Pneumococcal Conjugate-13 10/01/2014  . Pneumococcal Polysaccharide-23 07/30/2011  .  Tdap 04/10/2016  . Zoster 09/11/2014   Health Maintenance Due  Topic Date Due  . OPHTHALMOLOGY EXAM  08/25/2015  . HEMOGLOBIN A1C  04/01/2017    Patient Care Team: Laurey Morale, MD as PCP - General Louis Meckel Viona Gilmore, MD as Attending Physician (Urology) Heath Lark, MD as Consulting Physician (Hematology and Oncology) Kary Kos, MD as Consulting Physician (Neurosurgery)  Indicate any recent Medical Services you may have received from other than [facility] providers in the past year (date may be approximate).    Assessment:   This is a routine wellness examination for [place].  Hearing/Vision screen Hearing Screening Comments: Hearing aids are working well Vision Screening Comments: Vision no issues to date Wears glasses   Dietary issues and exercise activities discussed:    Goals    . Patient Stated     Continue your exercise       Depression Screen PHQ 2/9 Scores 10/06/2017 10/06/2017 09/13/2015 09/11/2014  PHQ - 2 Score 0 0 0 0    Fall Risk Fall Risk  10/06/2017 10/06/2017 09/13/2015 09/11/2014 05/04/2014  Falls in the past year? No No No No No   No issues at present    Cognitive Function: MMSE - Mini Mental State Exam 10/06/2017  Not completed: (No  Data)     Ad8 score reviewed for issues:  Issues making decisions:  Less interest in hobbies / activities:  Repeats questions, stories (family complaining):  Trouble using ordinary gadgets (microwave, computer, phone):  Forgets the month or year:   Mismanaging finances:   Remembering appts:  Daily problems with thinking and/or memory: Ad8 score is=0   Screening Tests Health Maintenance  Topic Date Due  . OPHTHALMOLOGY EXAM  08/25/2015  . HEMOGLOBIN A1C  04/01/2017  . FOOT EXAM  10/14/2018 (Originally 11/17/1950)  . TETANUS/TDAP  04/10/2026  . INFLUENZA VACCINE  Completed  . PNA vac Low Risk Adult  Completed    Qualifies for Shingles Vaccine? Education given        Plan:      PCP Notes   Health Maintenance Educated regarding shingrix  Good exercise routine   Abnormal Screens  None   Referrals  None   Patient concerns; None   Nurse Concerns; As noted   Next PCP apt Seen the doctor today       I have personally reviewed and noted the following in the patient's chart:   . Medical and social history . Use of alcohol, tobacco or illicit drugs  . Current medications and supplements . Functional ability and status . Nutritional status . Physical activity . Advanced directives . List of other physicians . Hospitalizations, surgeries, and ER visits in previous 12 months . Vitals . Screenings to include cognitive, depression, and falls . Referrals and appointments  In addition, I have reviewed and discussed with patient certain preventive protocols, quality metrics, and best practice recommendations. A written personalized care plan for preventive services as well as general preventive health recommendations were provided to patient.     GNFAO,ZHYQM, RN   10/06/2017    I have read this note and agree with its contents.  Alysia Penna, MD

## 2017-10-06 ENCOUNTER — Ambulatory Visit (INDEPENDENT_AMBULATORY_CARE_PROVIDER_SITE_OTHER): Payer: Medicare PPO

## 2017-10-06 ENCOUNTER — Ambulatory Visit (INDEPENDENT_AMBULATORY_CARE_PROVIDER_SITE_OTHER): Payer: Medicare PPO | Admitting: Family Medicine

## 2017-10-06 ENCOUNTER — Encounter: Payer: Self-pay | Admitting: Family Medicine

## 2017-10-06 VITALS — BP 138/60 | Ht 70.0 in | Wt 204.0 lb

## 2017-10-06 VITALS — BP 138/76 | HR 60 | Temp 97.4°F | Ht 70.5 in | Wt 204.2 lb

## 2017-10-06 DIAGNOSIS — E782 Mixed hyperlipidemia: Secondary | ICD-10-CM | POA: Diagnosis not present

## 2017-10-06 DIAGNOSIS — Z0000 Encounter for general adult medical examination without abnormal findings: Secondary | ICD-10-CM

## 2017-10-06 DIAGNOSIS — I10 Essential (primary) hypertension: Secondary | ICD-10-CM | POA: Diagnosis not present

## 2017-10-06 DIAGNOSIS — N138 Other obstructive and reflux uropathy: Secondary | ICD-10-CM

## 2017-10-06 DIAGNOSIS — E119 Type 2 diabetes mellitus without complications: Secondary | ICD-10-CM | POA: Diagnosis not present

## 2017-10-06 DIAGNOSIS — N401 Enlarged prostate with lower urinary tract symptoms: Secondary | ICD-10-CM | POA: Diagnosis not present

## 2017-10-06 LAB — CBC WITH DIFFERENTIAL/PLATELET
Basophils Absolute: 0.1 10*3/uL (ref 0.0–0.1)
Basophils Relative: 1 % (ref 0.0–3.0)
Eosinophils Absolute: 0.1 10*3/uL (ref 0.0–0.7)
Eosinophils Relative: 1.5 % (ref 0.0–5.0)
HCT: 43.8 % (ref 39.0–52.0)
Hemoglobin: 15 g/dL (ref 13.0–17.0)
Lymphocytes Relative: 29.3 % (ref 12.0–46.0)
Lymphs Abs: 2.1 10*3/uL (ref 0.7–4.0)
MCHC: 34.3 g/dL (ref 30.0–36.0)
MCV: 88.4 fl (ref 78.0–100.0)
Monocytes Absolute: 0.7 10*3/uL (ref 0.1–1.0)
Monocytes Relative: 10 % (ref 3.0–12.0)
Neutro Abs: 4.1 10*3/uL (ref 1.4–7.7)
Neutrophils Relative %: 58.2 % (ref 43.0–77.0)
Platelets: 172 10*3/uL (ref 150.0–400.0)
RBC: 4.95 Mil/uL (ref 4.22–5.81)
RDW: 13.3 % (ref 11.5–15.5)
WBC: 7 10*3/uL (ref 4.0–10.5)

## 2017-10-06 LAB — POC URINALSYSI DIPSTICK (AUTOMATED)
Bilirubin, UA: NEGATIVE
Blood, UA: NEGATIVE
Glucose, UA: NEGATIVE
Ketones, UA: NEGATIVE
Leukocytes, UA: NEGATIVE
Nitrite, UA: NEGATIVE
Protein, UA: NEGATIVE
Spec Grav, UA: >=1.03 — AB (ref 1.010–1.025)
Urobilinogen, UA: 0.2 E.U./dL
pH, UA: 5.5 (ref 5.0–8.0)

## 2017-10-06 LAB — LIPID PANEL
Cholesterol: 117 mg/dL (ref 0–200)
HDL: 39.4 mg/dL (ref >39.00)
LDL Cholesterol: 51 mg/dL (ref 0–99)
NonHDL: 77.28
Total CHOL/HDL Ratio: 3
Triglycerides: 131 mg/dL (ref 0.0–149.0)
VLDL: 26.2 mg/dL (ref 0.0–40.0)

## 2017-10-06 LAB — PSA: PSA: 1.25 ng/mL (ref 0.10–4.00)

## 2017-10-06 LAB — BASIC METABOLIC PANEL
BUN: 16 mg/dL (ref 6–23)
CO2: 26 mEq/L (ref 19–32)
Calcium: 9.4 mg/dL (ref 8.4–10.5)
Chloride: 103 mEq/L (ref 96–112)
Creatinine, Ser: 1.14 mg/dL (ref 0.40–1.50)
GFR: 66.22 mL/min (ref >60.00)
Glucose, Bld: 175 mg/dL — ABNORMAL HIGH (ref 70–99)
Potassium: 4.5 mEq/L (ref 3.5–5.1)
Sodium: 142 mEq/L (ref 135–145)

## 2017-10-06 LAB — HEPATIC FUNCTION PANEL
ALT: 16 U/L (ref 0–53)
AST: 15 U/L (ref 0–37)
Albumin: 4.3 g/dL (ref 3.5–5.2)
Alkaline Phosphatase: 51 U/L (ref 39–117)
Bilirubin, Direct: 0.2 mg/dL (ref 0.0–0.3)
Total Bilirubin: 1 mg/dL (ref 0.2–1.2)
Total Protein: 7.2 g/dL (ref 6.0–8.3)

## 2017-10-06 LAB — TSH: TSH: 3.61 u[IU]/mL (ref 0.35–4.50)

## 2017-10-06 LAB — HEMOGLOBIN A1C: Hgb A1c MFr Bld: 7.2 % — ABNORMAL HIGH (ref 4.6–6.5)

## 2017-10-06 NOTE — Progress Notes (Signed)
   Subjective:    Patient ID: Bobby Lopez, male    DOB: Feb 01, 1941, 77 y.o.   MRN: [id number]  HPI Here to follow up on issues. He feels well. One month ago he had some hemorrhoids banded and this went well. So far there has been no rectal bleeding since then. His BP is stable. He does not check his glucoses. Due to a hx of DVT, he wears compression stockings every day.    Review of Systems  Constitutional: Negative.   HENT: Negative.   Eyes: Negative.   Respiratory: Negative.   Cardiovascular: Negative.   Gastrointestinal: Negative.   Genitourinary: Negative.   Musculoskeletal: Negative.   Skin: Negative.   Neurological: Negative.   Psychiatric/Behavioral: Negative.        Objective:   Physical Exam  Constitutional: He is oriented to person, place, and time. He appears well-developed and well-nourished. No distress.  HENT:  Head: Normocephalic and atraumatic.  Right Ear: External ear normal.  Left Ear: External ear normal.  Nose: Nose normal.  Mouth/Throat: Oropharynx is clear and moist. No oropharyngeal exudate.  Eyes: Conjunctivae and EOM are normal. Pupils are equal, round, and reactive to light. Right eye exhibits no discharge. Left eye exhibits no discharge. No scleral icterus.  Neck: Neck supple. No JVD present. No tracheal deviation present. No thyromegaly present.  Cardiovascular: Normal rate, regular rhythm, normal heart sounds and intact distal pulses. Exam reveals no gallop and no friction rub.  No murmur heard. Pulmonary/Chest: Effort normal and breath sounds normal. No respiratory distress. He has no wheezes. He has no rales. He exhibits no tenderness.  Abdominal: Soft. Bowel sounds are normal. He exhibits no distension and no mass. There is no tenderness. There is no rebound and no guarding.  Genitourinary: Penis normal. No penile tenderness.  Genitourinary Comments: We skipped the DRE today due to recent banding procedure   Musculoskeletal: Normal range of  motion. He exhibits no edema or tenderness.  Lymphadenopathy:    He has no cervical adenopathy.  Neurological: He is alert and oriented to person, place, and time. He has normal reflexes. No cranial nerve deficit. He exhibits normal muscle tone. Coordination normal.  Skin: Skin is warm and dry. No rash noted. He is not diaphoretic. No erythema. No pallor.  Psychiatric: He has a normal mood and affect. His behavior is normal. Judgment and thought content normal.          Assessment & Plan:  His HTN is stable. Get fasting labs to check the lipids, etc. Check an A1c.  Alysia Penna, MD

## 2017-10-06 NOTE — Patient Instructions (Addendum)
[name] , Thank you for taking time to come for your Medicare Wellness Visit. I appreciate your ongoing commitment to your health goals. Please review the following plan we discussed and let me know if I can assist you in the future.  Shingrix is a vaccine for the prevention of Shingles in Adults 50 and older.  If you are on Medicare, you can request a prescription from your doctor to be filled at a pharmacy.  Please check with your benefits regarding applicable copays or out of pocket expenses.  The Shingrix is given in 2 vaccines approx 8 weeks apart. You must receive the 2nd dose prior to 6 months from receipt of the first.    These are the goals we discussed: Goals    . Patient Stated     Continue your exercise        This is a list of the screening recommended for you and due dates:  Health Maintenance  Topic Date Due  . Complete foot exam   11/17/1950  . Eye exam for diabetics  08/25/2015  . Hemoglobin A1C  04/01/2017  . Tetanus Vaccine  04/10/2026  . Flu Shot  Completed  . Pneumonia vaccines  Completed    Health Maintenance, Male A healthy lifestyle and preventive care is important for your health and wellness. Ask your health care provider about what schedule of regular examinations is right for you. What should I know about weight and diet? Eat a Healthy Diet  Eat plenty of vegetables, fruits, whole grains, low-fat dairy products, and lean protein.  Do not eat a lot of foods high in solid fats, added sugars, or salt.  Maintain a Healthy Weight Regular exercise can help you achieve or maintain a healthy weight. You should:  Do at 77 minutes of exercise each week. The exercise should increase your heart rate and make you sweat (moderate-intensity exercise).  Do strength-training exercises at least twice a week.  Watch Your Levels of Cholesterol and Blood Lipids  Have your blood tested for lipids and cholesterol every 5 years starting at 77 years of  age. If you are at high risk for heart disease, you should start having your blood tested when you are 77 years old. You may need to have your cholesterol levels checked more often if: ? Your lipid or cholesterol levels are high. ? You are older than 77 years of age. ? You are at high risk for heart disease.  What should I know about cancer screening? Many types of cancers can be detected early and may often be prevented. Lung Cancer  You should be screened every year for lung cancer if: ? You are a current smoker who has smoked for at least 30 years. ? You are a former smoker who has quit within the past 15 years.  Talk to your health care provider about your screening options, when you should start screening, and how often you should be screened.  Colorectal Cancer  Routine colorectal cancer screening usually begins at 77 years of age and should be repeated every 5-10 years until you are 77 years old. You may need to be screened more often if early forms of precancerous polyps or small growths are found. Your health care provider may recommend screening at an earlier age if you have risk factors for colon cancer.  Your health care provider may recommend using home test kits to check for hidden blood in the stool.  A small camera at the end  of a tube can be used to examine your colon (sigmoidoscopy or colonoscopy). This checks for the earliest forms of colorectal cancer.  Prostate and Testicular Cancer  Depending on your age and overall health, your health care provider may do certain tests to screen for prostate and testicular cancer.  Talk to your health care provider about any symptoms or concerns you have about testicular or prostate cancer.  Skin Cancer  Check your skin from head to toe regularly.  Tell your health care provider about any new moles or changes in moles, especially if: ? There is a change in a mole's size, shape, or color. ? You have a mole that is larger than  a pencil eraser.  Always use sunscreen. Apply sunscreen liberally and repeat throughout the day.  Protect yourself by wearing long sleeves, pants, a wide-brimmed hat, and sunglasses when outside.  What should I know about heart disease, diabetes, and high blood pressure?  If you are 45-12 years of age, have your blood pressure checked every 3-5 years. If you are 77 years of age or older, have your blood pressure checked every year. You should have your blood pressure measured twice-once when you are at a hospital or clinic, and once when you are not at a hospital or clinic. Record the average of the two measurements. To check your blood pressure when you are not at a hospital or clinic, you can use: ? An automated blood pressure machine at a pharmacy. ? A home blood pressure monitor.  Talk to your health care provider about your target blood pressure.  If you are between 17-46 years old, ask your health care provider if you should take aspirin to prevent heart disease.  Have regular diabetes screenings by checking your fasting blood sugar level. ? If you are at a normal weight and have a low risk for diabetes, have this test once every three years after the age of 77. ? If you are overweight and have a high risk for diabetes, consider being tested at a younger age or more often.  A one-time screening for abdominal aortic aneurysm (AAA) by ultrasound is recommended for men aged 59-75 years who are current or former smokers. What should I know about preventing infection? Hepatitis B If you have a higher risk for hepatitis B, you should be screened for this virus. Talk with your health care provider to find out if you are at risk for hepatitis B infection. Hepatitis C Blood testing is recommended for:  Everyone born from [birth year] through 1965.  Anyone with known risk factors for hepatitis C.  Sexually Transmitted Diseases (STDs)  You should be screened each year for STDs including gonorrhea  and chlamydia if: ? You are sexually active and are younger than 77 years of age. ? You are older than 77 years of age and your health care provider tells you that you are at risk for this type of infection. ? Your sexual activity has changed since you were last screened and you are at an increased risk for chlamydia or gonorrhea. Ask your health care provider if you are at risk.  Talk with your health care provider about whether you are at high risk of being infected with HIV. Your health care provider may recommend a prescription medicine to help prevent HIV infection.  What else can I do?  Schedule regular health, dental, and eye exams.  Stay current with your vaccines (immunizations).  Do not use any tobacco products, such as cigarettes,  chewing tobacco, and e-cigarettes. If you need help quitting, ask your health care provider.  Limit alcohol intake to no more than 2 drinks per day. One drink equals 12 ounces of beer, 5 ounces of wine, or 1 ounces of hard liquor.  Do not use street drugs.  Do not share needles.  Ask your health care provider for help if you need support or information about quitting drugs.  Tell your health care provider if you often feel depressed.  Tell your health care provider if you have ever been abused or do not feel safe at home. This information is not intended to replace advice given to you by your health care provider. Make sure you discuss any questions you have with your health care provider. Document Released: 02/27/2008 Document Revised: 04/29/2016 Document Reviewed: 06/04/2015 Elsevier Interactive Patient Education  2018 [place] in the Home Falls can cause injuries and can affect people from all age groups. There are many simple things that you can do to make your home safe and to help prevent falls. What can I do on the outside of my home?  Regularly repair the edges of walkways and driveways and fix any  cracks.  Remove high doorway thresholds.  Trim any shrubbery on the main path into your home.  Use bright outdoor lighting.  Clear walkways of debris and clutter, including tools and rocks.  Regularly check that handrails are securely fastened and in good repair. Both sides of any steps should have handrails.  Install guardrails along the edges of any raised decks or porches.  Have leaves, snow, and ice cleared regularly.  Use sand or salt on walkways during winter months.  In the garage, clean up any spills right away, including grease or oil spills. What can I do in the bathroom?  Use night lights.  Install grab bars by the toilet and in the tub and shower. Do not use towel bars as grab bars.  Use non-skid mats or decals on the floor of the tub or shower.  If you need to sit down while you are in the shower, use a plastic, non-slip stool.  Keep the floor dry. Immediately clean up any water that spills on the floor.  Remove soap buildup in the tub or shower on a regular basis.  Attach bath mats securely with double-sided non-slip rug tape.  Remove throw rugs and other tripping hazards from the floor. What can I do in the bedroom?  Use night lights.  Make sure that a bedside light is easy to reach.  Do not use oversized bedding that drapes onto the floor.  Have a firm chair that has side arms to use for getting dressed.  Remove throw rugs and other tripping hazards from the floor. What can I do in the kitchen?  Clean up any spills right away.  Avoid walking on wet floors.  Place frequently used items in easy-to-reach places.  If you need to reach for something above you, use a sturdy step stool that has a grab bar.  Keep electrical cables out of the way.  Do not use floor polish or wax that makes floors slippery. If you have to use wax, make sure that it is non-skid floor wax.  Remove throw rugs and other tripping hazards from the floor. What can I do in  the stairways?  Do not leave any items on the stairs.  Make sure that there are handrails on both sides of the stairs.  Fix handrails that are broken or loose. Make sure that handrails are as long as the stairways.  Check any carpeting to make sure that it is firmly attached to the stairs. Fix any carpet that is loose or worn.  Avoid having throw rugs at the top or bottom of stairways, or secure the rugs with carpet tape to prevent them from moving.  Make sure that you have a light switch at the top of the stairs and the bottom of the stairs. If you do not have them, have them installed. What are some other fall prevention tips?  Wear closed-toe shoes that fit well and support your feet. Wear shoes that have rubber soles or low heels.  When you use a stepladder, make sure that it is completely opened and that the sides are firmly locked. Have someone hold the ladder while you are using it. Do not climb a closed stepladder.  Add color or contrast paint or tape to grab bars and handrails in your home. Place contrasting color strips on the first and last steps.  Use mobility aids as needed, such as canes, walkers, scooters, and crutches.  Turn on lights if it is dark. Replace any light bulbs that burn out.  Set up furniture so that there are clear paths. Keep the furniture in the same spot.  Fix any uneven floor surfaces.  Choose a carpet design that does not hide the edge of steps of a stairway.  Be aware of any and all pets.  Review your medicines with your healthcare provider. Some medicines can cause dizziness or changes in blood pressure, which increase your risk of falling. Talk with your health care provider about other ways that you can decrease your risk of falls. This may include working with a physical therapist or trainer to improve your strength, balance, and endurance. This information is not intended to replace advice given to you by your health care provider. Make sure  you discuss any questions you have with your health care provider. Document Released: 08/21/2002 Document Revised: 01/28/2016 Document Reviewed: 10/05/2014 Elsevier Interactive Patient Education  [employer].

## 2017-10-21 ENCOUNTER — Telehealth: Payer: Self-pay

## 2017-10-21 NOTE — Telephone Encounter (Signed)
Sent to PCP to review results will call pt once done.  Thanks     Copied from [place] [id number]. Topic: Quick Communication - Lab Results >> Oct 18, 2017 10:50 AM Oliver Pila B wrote: Pt would like to know the lab results from the 1.23.19 visit, contact pt to advise

## 2017-10-25 NOTE — Telephone Encounter (Signed)
Called and spoke with pt. Pt advised and voiced understanding.  

## 2017-10-25 NOTE — Telephone Encounter (Signed)
The labs were all normal except his glucose control is not quite as good (A1c was 7.2). Watch a strict low carb diet

## 2017-12-14 ENCOUNTER — Other Ambulatory Visit: Payer: Self-pay | Admitting: Family Medicine

## 2018-03-12 ENCOUNTER — Other Ambulatory Visit: Payer: Self-pay | Admitting: Family Medicine

## 2018-05-24 ENCOUNTER — Ambulatory Visit: Payer: Medicare PPO

## 2018-05-24 ENCOUNTER — Ambulatory Visit (INDEPENDENT_AMBULATORY_CARE_PROVIDER_SITE_OTHER): Payer: Medicare PPO | Admitting: *Deleted

## 2018-05-24 DIAGNOSIS — Z23 Encounter for immunization: Secondary | ICD-10-CM | POA: Diagnosis not present

## 2018-06-20 ENCOUNTER — Other Ambulatory Visit: Payer: Self-pay | Admitting: Family Medicine

## 2018-06-20 MED ORDER — LOSARTAN POTASSIUM-HCTZ 50-12.5 MG PO TABS: 1.0000 | ORAL_TABLET | Freq: Every day | ORAL | 3 refills | Status: DC

## 2018-06-29 ENCOUNTER — Other Ambulatory Visit: Payer: Self-pay | Admitting: Family Medicine

## 2018-06-29 MED ORDER — LOSARTAN POTASSIUM 50 MG PO TABS: 50.0000 mg | ORAL_TABLET | Freq: Every day | ORAL | 1 refills | Status: DC

## 2018-06-29 MED ORDER — HYDROCHLOROTHIAZIDE 12.5 MG PO CAPS: 12.5000 mg | ORAL_CAPSULE | Freq: Every day | ORAL | 1 refills | Status: DC

## 2018-10-07 ENCOUNTER — Encounter: Payer: Medicare PPO | Admitting: Family Medicine

## 2018-10-07 ENCOUNTER — Ambulatory Visit: Payer: Medicare PPO

## 2018-10-07 NOTE — Progress Notes (Addendum)
Subjective:   Bobby Lopez is a 78 y.o. male who presents for Medicare Annual/Subsequent preventive examination.  Review of Systems:  No ROS.  Medicare Wellness Visit. Additional risk factors are reflected in the social history.  Cardiac Risk Factors include: diabetes mellitus;male gender;advanced age (>59men, >43 women);hypertension;sedentary lifestyle Sleep patterns: has difficulty falling asleep, has interrupted sleep and sleeps 3-6 hours nightly.    Home Safety/Smoke Alarms: Feels safe in home. Smoke alarms in place.  Living environment; residence and Firearm Safety: 2-story house. No use or need for DME at this time.  Seat Belt Safety/Bike Helmet: Wears seat belt.    Male:   [facility]- 03/2017. Repeat in 3-5 years per report. But pt. States, "I'm done".   PSA-  Lab Results  Component Value Date   PSA 1.25 10/06/2017   PSA 1.68 10/02/2016   PSA 1.24 03/10/2016       Objective:    Vitals: BP 126/72 (BP Location: Right Arm, Patient Position: Sitting, Cuff Size: Normal)   Pulse 65   Temp 98.1 F (36.7 C)   Resp 16   Ht 5\' 10"  (1.778 m)   Wt 205 lb (93 kg)   SpO2 95%   BMI 29.41 kg/m   Body mass index is 29.41 kg/m.  Advanced Directives 10/10/2018 10/06/2017 03/10/2017 07/20/2014 07/13/2014 10/23/2013 10/19/2013  Does Patient Have a Medical Advance Directive? Yes Yes Yes Yes Yes Patient has advance directive, copy in chart Patient has advance directive, copy in chart  Type of Advance Directive Living will;Healthcare Power of [place];Living will Living will Living will - -  Does patient want to make changes to medical advance directive? No - Patient declined - - - - No No  Copy of Healthcare Power of Attorney in Chart? Yes - validated most recent copy scanned in chart (See row information) - - Yes Yes - -  Pre-existing out of facility DNR order (yellow form or pink MOST form) - - - - - - -    Tobacco Social History   Tobacco Use  Smoking  Status Former Smoker  . Last attempt to quit: 07/13/1973  . Years since quitting: 45.2  Smokeless Tobacco Never Used  Tobacco Comment   quit > 46 years ago     Counseling given: Not Answered Comment: quit > 46 years ago    Past Medical History:  Diagnosis Date  . Arthritis    Back  . Bladder stones    NEEDS TO HAVE SURGERY  . BPH (benign prostatic hyperplasia)   . Diabetes mellitus    type  II  . DVT (deep venous thrombosis) ([place])   . Eczema   . Hyperlipidemia   . Hypertension   . Lumbar herniated disc    Past Surgical History:  Procedure Laterality Date  . CHOLECYSTECTOMY    . COLONOSCOPY  03/24/2017   per Dr. Silverio Decamp, adenomatous polyps, no repeats due to age   . CYSTOSCOPY W/ RETROGRADES Bilateral 07/20/2014   Procedure: CYSTOLITHALOPAXY;  Surgeon: Ardis Hughs, MD;  Location: [facility];  Service: Urology;  Laterality: Bilateral;  . HOLMIUM LASER APPLICATION N/A 51/03/6159   Procedure: HOLMIUM LASER APPLICATION;  Surgeon: Ardis Hughs, MD;  Location: [facility];  Service: Urology;  Laterality: N/A;  . INSERTION OF MESH N/A 10/19/2013   Procedure: INSERTION OF MESH;  Surgeon: Merrie Roof, MD;  Location: [facility];  Service: General;  Laterality: N/A;  . LUMBAR LAMINECTOMY/DECOMPRESSION MICRODISCECTOMY  09/26/2012  Procedure: LUMBAR LAMINECTOMY/DECOMPRESSION MICRODISCECTOMY 1 LEVEL;  Surgeon: Elaina Hoops, MD;  Location: [facility];  Service: Neurosurgery;  Laterality: Left;  Lumbar Lamiectomy Extraforaminal Diskectomy Lumbar Three-Four Left  . TRANSURETHRAL RESECTION OF PROSTATE N/A 07/20/2014   Procedure: TRANSURETHRAL RESECTION OF THE PROSTATE (TURP) WITH GYRUS;  Surgeon: Ardis Hughs, MD;  Location: [facility];  Service: Urology;  Laterality: N/A;  . VENTRAL HERNIA REPAIR  10/19/2013   DR TOTH  . VENTRAL HERNIA REPAIR N/A 10/19/2013   Procedure: LAPAROSCOPIC VENTRAL HERNIA;  Surgeon: Merrie Roof, MD;  Location: [facility];  Service: General;  Laterality: N/A;    Family History  Problem Relation Age of Onset  . Diabetes Mother   . Hypertension Mother   . Esophageal cancer Sister   . Colon cancer Neg Hx   . Rectal cancer Neg Hx   . Stomach cancer Neg Hx    Social History   Socioeconomic History  . Marital status: Married    Spouse name: Not on file  . Number of children: Not on file  . Years of education: Not on file  . Highest education level: Not on file  Occupational History    Comment: retired Building control surveyor  Social Needs  . Financial resource strain: Not hard at all  . Food insecurity:    Worry: Never true    Inability: Never true  . Transportation needs:    Medical: No    Non-medical: No  Tobacco Use  . Smoking status: Former Smoker    Last attempt to quit: 07/13/1973    Years since quitting: 45.2  . Smokeless tobacco: Never Used  . Tobacco comment: quit > 46 years ago  Substance and Sexual Activity  . Alcohol use: No    Alcohol/week: 0.0 standard drinks  . Drug use: No  . Sexual activity: Not Currently  Lifestyle  . Physical activity:    Days per week: 0 days    Minutes per session: 0 min  . Stress: Not at all  Relationships  . Social connections:    Talks on phone: Once a week    Gets together: Once a week    Attends religious service: More than 4 times per year    Active member of club or organization: Yes    Attends meetings of clubs or organizations: More than 4 times per year    Relationship status: Married  Other Topics Concern  . 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 amLODipine (NORVASC) 5 MG tablet TAKE ONE TABLET BY MOUTH EVERY MORNING  . aspirin 81 MG tablet Take 162 mg by mouth daily.  Marland Kitchen atorvastatin (LIPITOR) 20 MG tablet TAKE ONE TABLET BY MOUTH EVERY EVENING   . docusate sodium (COLACE) 100 MG capsule Take 1 capsule (100 mg total) by mouth 2 (two) times daily as needed (take to keep stool soft.). (Patient taking differently: Take 100 mg by mouth daily. )  . hydrochlorothiazide (MICROZIDE) 12.5 MG capsule Take 1 capsule (12.5 mg total) by mouth daily.  Marland Kitchen losartan (COZAAR) 50 MG tablet Take 1 tablet (50 mg total) by mouth daily.  . metFORMIN (GLUCOPHAGE) 500 MG tablet TAKE ONE TABLET BY MOUTH TWICE A DAY WITH  A MEAL  **MUST CALL MD FOR APPOINTMENT  . Multiple Vitamin (MULTIVITAMIN WITH MINERALS) TABS tablet Take 1 tablet by mouth daily. Centrum silver  . Omega-3 Fatty Acids (FISH OIL PO) Take by mouth daily.  . Probiotic Product (ALIGN PO) Take 1 tablet by mouth daily.   . [DISCONTINUED] losartan-hydrochlorothiazide (HYZAAR) 50-12.5 MG tablet Take 1 tablet by mouth daily.   Facility-Administered Encounter Medications as of 10/10/2018  Medication  . 0.9 %  sodium chloride infusion    Activities of Daily Living In your present state of health, do you have any difficulty performing the following activities: 10/10/2018  Hearing? Y  Comment wears hearing aids  Vision? N  Difficulty concentrating or making decisions? N  Walking or climbing stairs? N  Dressing or bathing? N  Doing errands, shopping? N  Preparing Food and eating ? N  Using the Toilet? N  In the past six months, have you accidently leaked urine? N  Do you have problems with loss of bowel control? N  Managing your Medications? N  Managing your Finances? N  Housekeeping or managing your Housekeeping? N  Some recent data might be hidden    Patient Care Team: Laurey Morale, MD as PCP - General Louis Meckel Viona Gilmore, MD as Attending Physician (Urology) Heath Lark, MD as Consulting Physician (Hematology and Oncology) Kary Kos, MD as Consulting Physician (Neurosurgery)   Assessment:   This is a routine wellness examination for Kartik. Physical assessment deferred to  PCP.   Exercise Activities and Dietary recommendations Current Exercise Habits: The patient does not participate in regular exercise at present, Exercise limited by: orthopedic condition(s) Diet (meal preparation, eat out, water intake, caffeinated beverages, dairy products, fruits and vegetables): in general, a "healthy" diet  . Pt. States he drinks about 4-6 glasses of water daily.      Goals    . Patient Stated     Continue your exercise     . Patient Stated     Continue walking daily as tolerated.       Fall Risk Fall Risk  10/10/2018 10/06/2017 10/06/2017 09/13/2015 09/11/2014  Falls in the past year? 1 No No No No  Number falls in past yr: 0 - - - -  Injury with Fall? 1 - - - -  Risk for fall due to : History of fall(s);Impaired vision - - - -  Follow up Falls evaluation completed - - - -    Depression Screen PHQ 2/9 Scores 10/10/2018 10/06/2017 10/06/2017 09/13/2015  PHQ - 2 Score 2 0 0 0  PHQ- 9 Score 8 - - -    Cognitive Function MMSE - Mini Mental State Exam 10/06/2017  Not completed: (No Data)       Ad8 score reviewed for issues:  Issues making decisions: no  Less interest in hobbies / activities: no  Repeats questions, stories (family complaining): no  Trouble using ordinary gadgets (microwave, computer, phone):no  Forgets the month or year: no  Mismanaging finances: no  Remembering appts: no  Daily problems with thinking and/or memory: yes Ad8 score is= 1  Pt. stated he has noticed his recall has worsened in the past year, but denies forgetting things like turning stove off, or leaving the house not remembering where he is going. Importance of good sleep hygiene for memory recall discussed.     Immunization History  Administered Date(s) Administered  . Influenza Split 06/20/2012, 06/01/2013  . Influenza Whole 06/25/2008, 06/18/2009  . Influenza, High Dose Seasonal PF  07/10/2015, 06/05/2016, 05/24/2018  . Influenza-Unspecified 06/11/2014,  06/14/2017  . Pneumococcal Conjugate-13 10/01/2014  . Pneumococcal Polysaccharide-23 07/30/2011  . Tdap 04/10/2016  . Zoster 09/11/2014    Qualifies for Shingles Vaccine? Pt. Stated he already received shingrix series at local pharmacy, but needed to confirm with wife. Will follow up and let us know.  Screening Tests Health Maintenance  Topic Date Due  . OPHTHALMOLOGY EXAM  08/25/2015  . HEMOGLOBIN A1C  04/05/2018  . FOOT EXAM  10/11/2019  . TETANUS/TDAP  04/10/2026  . INFLUENZA VACCINE  Completed  . PNA vac Low Risk Adult  Completed      Plan:    Continue doing brain stimulating activities (puzzles, reading, adult coloring books, staying active) to keep memory sharp.   Follow up with ophthomology asap for diabetic retinopathy screen  Consider alternative pain management solutions for your back and hip, including stretching, or use of alternating heat/ice. Pain management is important for adequate sleep, mentation, and mood.  I have personally reviewed and noted the following in the patient's chart:   . Medical and social history . Use of alcohol, tobacco or illicit drugs  . Current medications and supplements . Functional ability and status . Nutritional status . Physical activity . Advanced directives . List of other physicians . Vitals . Screenings to include cognitive, depression, and falls . Referrals and appointments  In addition, I have reviewed and discussed with patient certain preventive protocols, quality metrics, and best practice recommendations. A written personalized care plan for preventive services as well as general preventive health recommendations were provided to patient.     Alphia Moh, RN  10/10/2018  I have read this note and agree with its contents. Alysia Penna, MD

## 2018-10-10 ENCOUNTER — Ambulatory Visit (INDEPENDENT_AMBULATORY_CARE_PROVIDER_SITE_OTHER): Payer: Medicare PPO | Admitting: Family Medicine

## 2018-10-10 ENCOUNTER — Ambulatory Visit (INDEPENDENT_AMBULATORY_CARE_PROVIDER_SITE_OTHER): Payer: Medicare PPO

## 2018-10-10 ENCOUNTER — Encounter: Payer: Self-pay | Admitting: Family Medicine

## 2018-10-10 VITALS — BP 126/72 | HR 65 | Temp 98.1°F | Ht 70.0 in | Wt 205.0 lb

## 2018-10-10 VITALS — BP 126/72 | HR 65 | Temp 98.1°F | Resp 16 | Ht 70.0 in | Wt 205.0 lb

## 2018-10-10 DIAGNOSIS — N401 Enlarged prostate with lower urinary tract symptoms: Secondary | ICD-10-CM

## 2018-10-10 DIAGNOSIS — I10 Essential (primary) hypertension: Secondary | ICD-10-CM

## 2018-10-10 DIAGNOSIS — Z0000 Encounter for general adult medical examination without abnormal findings: Secondary | ICD-10-CM

## 2018-10-10 DIAGNOSIS — N138 Other obstructive and reflux uropathy: Secondary | ICD-10-CM | POA: Diagnosis not present

## 2018-10-10 DIAGNOSIS — E782 Mixed hyperlipidemia: Secondary | ICD-10-CM

## 2018-10-10 DIAGNOSIS — S70212A Abrasion, left hip, initial encounter: Secondary | ICD-10-CM

## 2018-10-10 DIAGNOSIS — M1990 Unspecified osteoarthritis, unspecified site: Secondary | ICD-10-CM | POA: Diagnosis not present

## 2018-10-10 DIAGNOSIS — E119 Type 2 diabetes mellitus without complications: Secondary | ICD-10-CM

## 2018-10-10 LAB — CBC WITH DIFFERENTIAL/PLATELET
Basophils Absolute: 0.1 10*3/uL (ref 0.0–0.1)
Basophils Relative: 0.5 % (ref 0.0–3.0)
Eosinophils Absolute: 0.1 10*3/uL (ref 0.0–0.7)
Eosinophils Relative: 1.3 % (ref 0.0–5.0)
HCT: 44.4 % (ref 39.0–52.0)
Hemoglobin: 15.5 g/dL (ref 13.0–17.0)
Lymphocytes Relative: 24.1 % (ref 12.0–46.0)
Lymphs Abs: 2.4 10*3/uL (ref 0.7–4.0)
MCHC: 35 g/dL (ref 30.0–36.0)
MCV: 88.6 fl (ref 78.0–100.0)
Monocytes Absolute: 0.9 10*3/uL (ref 0.1–1.0)
Monocytes Relative: 9.3 % (ref 3.0–12.0)
Neutro Abs: 6.4 10*3/uL (ref 1.4–7.7)
Neutrophils Relative %: 64.8 % (ref 43.0–77.0)
Platelets: 174 10*3/uL (ref 150.0–400.0)
RBC: 5.02 Mil/uL (ref 4.22–5.81)
RDW: 13 % (ref 11.5–15.5)
WBC: 9.9 10*3/uL (ref 4.0–10.5)

## 2018-10-10 LAB — BASIC METABOLIC PANEL
BUN: 17 mg/dL (ref 6–23)
CO2: 28 mEq/L (ref 19–32)
Calcium: 9.8 mg/dL (ref 8.4–10.5)
Chloride: 103 mEq/L (ref 96–112)
Creatinine, Ser: 1.11 mg/dL (ref 0.40–1.50)
GFR: 64.09 mL/min (ref >60.00)
Glucose, Bld: 186 mg/dL — ABNORMAL HIGH (ref 70–99)
Potassium: 4 mEq/L (ref 3.5–5.1)
Sodium: 140 mEq/L (ref 135–145)

## 2018-10-10 LAB — POC URINALSYSI DIPSTICK (AUTOMATED)
Bilirubin, UA: NEGATIVE
Blood, UA: NEGATIVE
Glucose, UA: NEGATIVE
Ketones, UA: NEGATIVE
Leukocytes, UA: NEGATIVE
Nitrite, UA: NEGATIVE
Protein, UA: NEGATIVE
Spec Grav, UA: 1.025 (ref 1.010–1.025)
Urobilinogen, UA: 0.2 E.U./dL
pH, UA: 5.5 (ref 5.0–8.0)

## 2018-10-10 LAB — LIPID PANEL
Cholesterol: 113 mg/dL (ref 0–200)
HDL: 39.1 mg/dL (ref >39.00)
LDL Cholesterol: 41 mg/dL (ref 0–99)
NonHDL: 74.27
Total CHOL/HDL Ratio: 3
Triglycerides: 167 mg/dL — ABNORMAL HIGH (ref 0.0–149.0)
VLDL: 33.4 mg/dL (ref 0.0–40.0)

## 2018-10-10 LAB — HEPATIC FUNCTION PANEL
ALT: 19 U/L (ref 0–53)
AST: 16 U/L (ref 0–37)
Albumin: 4.5 g/dL (ref 3.5–5.2)
Alkaline Phosphatase: 53 U/L (ref 39–117)
Bilirubin, Direct: 0.3 mg/dL (ref 0.0–0.3)
Total Bilirubin: 1.3 mg/dL — ABNORMAL HIGH (ref 0.2–1.2)
Total Protein: 7.3 g/dL (ref 6.0–8.3)

## 2018-10-10 LAB — PSA: PSA: 1.66 ng/mL (ref 0.10–4.00)

## 2018-10-10 LAB — HEMOGLOBIN A1C: Hgb A1c MFr Bld: 7.2 % — ABNORMAL HIGH (ref 4.6–6.5)

## 2018-10-10 LAB — TSH: TSH: 2.85 u[IU]/mL (ref 0.35–4.50)

## 2018-10-10 NOTE — Progress Notes (Signed)
Subjective:    Patient ID: Bobby Lopez, male    DOB: 12/27/1940, 78 y.o.   MRN: [id number]  HPI Here to follow up on issues. He has been doing well in general. Two nights ago he fell while taking his dog out to the yard. His foot slipped off a step and he landed on the left side. He has a scrape on the left thigh and he been been applying Neosporin to it. There is no pain with bearing weight on the leg. His BP is stable he watches is diet.    Review of Systems  Constitutional: Negative.   HENT: Negative.   Eyes: Negative.   Respiratory: Negative.   Cardiovascular: Negative.   Gastrointestinal: Negative.   Genitourinary: Negative.   Musculoskeletal: Negative.   Skin: Positive for wound.  Neurological: Negative.   Psychiatric/Behavioral: Negative.        Objective:   Physical Exam Constitutional:      General: He is not in acute distress.    Appearance: He is well-developed. He is not diaphoretic.  HENT:     Head: Normocephalic and atraumatic.     Right Ear: External ear normal.     Left Ear: External ear normal.     Nose: Nose normal.     Mouth/Throat:     Pharynx: No oropharyngeal exudate.  Eyes:     General: No scleral icterus.       Right eye: No discharge.        Left eye: No discharge.     Conjunctiva/sclera: Conjunctivae normal.     Pupils: Pupils are equal, round, and reactive to light.  Neck:     Musculoskeletal: Neck supple.     Thyroid: No thyromegaly.     Vascular: No JVD.     Trachea: No tracheal deviation.  Cardiovascular:     Rate and Rhythm: Normal rate and regular rhythm.     Heart sounds: Normal heart sounds. No murmur. No friction rub. No gallop.   Pulmonary:     Effort: Pulmonary effort is normal. No respiratory distress.     Breath sounds: Normal breath sounds. No wheezing or rales.  Chest:     Chest wall: No tenderness.  Abdominal:     General: Bowel sounds are normal. There is no distension.     Palpations: Abdomen is soft. There is no  mass.     Tenderness: There is no abdominal tenderness. There is no guarding or rebound.  Genitourinary:    Penis: Normal. No tenderness.      Prostate: Normal.     Rectum: Normal. Guaiac result negative.  Musculoskeletal: Normal range of motion.        General: No tenderness.  Lymphadenopathy:     Cervical: No cervical adenopathy.  Skin:    General: Skin is warm and dry.     Coloration: Skin is not pale.     Findings: No erythema or rash.     Comments: There is a small abrasion over the left greater trochanter, it looks clean   Neurological:     Mental Status: He is alert and oriented to person, place, and time.     Cranial Nerves: No cranial nerve deficit.     Motor: No abnormal muscle tone.     Coordination: Coordination normal.     Deep Tendon Reflexes: Reflexes are normal and symmetric. Reflexes normal.  Psychiatric:        Behavior: Behavior normal.  Thought Content: Thought content normal.        Judgment: Judgment normal.           Assessment & Plan:  The hip abrasion should heal well. He will apply Neosporin for a few more days. His HTN and arthritis are stable. Get fasting labs inclduing lipids and an A1c today.  Alysia Penna, MD

## 2018-10-10 NOTE — Patient Instructions (Addendum)
Continue doing brain stimulating activities (puzzles, reading, adult coloring books, staying active) to keep memory sharp.   Follow up with ophthomology asap for diabetic retinopathy screen  Consider alternative pain management solutions for your back and hip, including stretching, or use of alternating heat/ice. Pain management is important for adequate sleep, mentation, and mood.    [name] , Thank you for taking time to come for your Medicare Wellness Visit. I appreciate your ongoing commitment to your health goals. Please review the following plan we discussed and let me know if I can assist you in the future.   These are the goals we discussed: Goals    . Patient Stated     Continue your exercise     . Patient Stated     Continue walking daily as tolerated.       This is a list of the screening recommended for you and due dates:  Health Maintenance  Topic Date Due  . Eye exam for diabetics  08/25/2015  . Hemoglobin A1C  04/05/2018  . Complete foot exam   10/14/2018*  . Tetanus Vaccine  04/10/2026  . Flu Shot  Completed  . Pneumonia vaccines  Completed  *Topic was postponed. The date shown is not the original due date.      Back Exercises If you have pain in your back, do these exercises 2-3 times each day or as told by your doctor. When the pain goes away, do the exercises once each day, but repeat the steps more times for each exercise (do more repetitions). If you do not have pain in your back, do these exercises once each day or as told by your doctor. Exercises Single Knee to Chest Do these steps 3-5 times in a row for each leg: 1. Lie on your back on a firm bed or the floor with your legs stretched out. 2. Bring one knee to your chest. 3. Hold your knee to your chest by grabbing your knee or thigh. 4. Pull on your knee until you feel a gentle stretch in your lower back. 5. Keep doing the stretch for 10-30 seconds. 6. Slowly let go of your leg and straighten  it. Pelvic Tilt Do these steps 5-10 times in a row: 1. Lie on your back on a firm bed or the floor with your legs stretched out. 2. Bend your knees so they point up to the ceiling. Your feet should be flat on the floor. 3. Tighten your lower belly (abdomen) muscles to press your lower back against the floor. This will make your tailbone point up to the ceiling instead of pointing down to your feet or the floor. 4. Stay in this position for 5-10 seconds while you gently tighten your muscles and breathe evenly. Cat-Cow Do these steps until your lower back bends more easily: 1. Get on your hands and knees on a firm surface. Keep your hands under your shoulders, and keep your knees under your hips. You may put padding under your knees. 2. Let your head hang down, and make your tailbone point down to the floor so your lower back is round like the back of a cat. 3. Stay in this position for 5 seconds. 4. Slowly lift your head and make your tailbone point up to the ceiling so your back hangs low (sags) like the back of a cow. 5. Stay in this position for 5 seconds.  Press-Ups Do these steps 5-10 times in a row: 1. Lie on your belly (  face-down) on the floor. 2. Place your hands near your head, about shoulder-width apart. 3. While you keep your back relaxed and keep your hips on the floor, slowly straighten your arms to raise the top half of your body and lift your shoulders. Do not use your back muscles. To make yourself more comfortable, you may change where you place your hands. 4. Stay in this position for 5 seconds. 5. Slowly return to lying flat on the floor.  Bridges Do these steps 10 times in a row: 1. Lie on your back on a firm surface. 2. Bend your knees so they point up to the ceiling. Your feet should be flat on the floor. 3. Tighten your butt muscles and lift your butt off of the floor until your waist is almost as high as your knees. If you do not feel the muscles working in your butt  and the back of your thighs, slide your feet 1-2 inches farther away from your butt. 4. Stay in this position for 3-5 seconds. 5. Slowly lower your butt to the floor, and let your butt muscles relax. If this exercise is too easy, try doing it with your arms crossed over your chest. Belly Crunches Do these steps 5-10 times in a row: 1. Lie on your back on a firm bed or the floor with your legs stretched out. 2. Bend your knees so they point up to the ceiling. Your feet should be flat on the floor. 3. Cross your arms over your chest. 4. Tip your chin a little bit toward your chest but do not bend your neck. 5. Tighten your belly muscles and slowly raise your chest just enough to lift your shoulder blades a tiny bit off of the floor. 6. Slowly lower your chest and your head to the floor. Back Lifts Do these steps 5-10 times in a row: 1. Lie on your belly (face-down) with your arms at your sides, and rest your forehead on the floor. 2. Tighten the muscles in your legs and your butt. 3. Slowly lift your chest off of the floor while you keep your hips on the floor. Keep the back of your head in line with the curve in your back. Look at the floor while you do this. 4. Stay in this position for 3-5 seconds. 5. Slowly lower your chest and your face to the floor. Contact a doctor if:  Your back pain gets a lot worse when you do an exercise.  Your back pain does not lessen 2 hours after you exercise. If you have any of these problems, stop doing the exercises. Do not do them again unless your doctor says it is okay. Get help right away if:  You have sudden, very bad back pain. If this happens, stop doing the exercises. Do not do them again unless your doctor says it is okay. This information is not intended to replace advice given to you by your health care provider. Make sure you discuss any questions you have with your health care provider. Document Released: 10/03/2010 Document Revised:  05/25/2018 Document Reviewed: 10/25/2014 Elsevier Interactive Patient Education  2019 [place] Prevention in the Home, Adult Falls can cause injuries. They can happen to people of all ages. There are many things you can do to make your home safe and to help prevent falls. Ask for help when making these changes, if needed. What actions can I take to prevent falls? General Instructions  Use good lighting in all  rooms. Replace any light bulbs that burn out.  Turn on the lights when you go into a dark area. Use night-lights.  Keep items that you use often in easy-to-reach places. Lower the shelves around your home if necessary.  Set up your furniture so you have a clear path. Avoid moving your furniture around.  Do not have throw rugs and other things on the floor that can make you trip.  Avoid walking on wet floors.  If any of your floors are uneven, fix them.  Add color or contrast paint or tape to clearly mark and help you see: ? Any grab bars or handrails. ? First and last steps of stairways. ? Where the edge of each step is.  If you use a stepladder: ? Make sure that it is fully opened. Do not climb a closed stepladder. ? Make sure that both sides of the stepladder are locked into place. ? Ask someone to hold the stepladder for you while you use it.  If there are any pets around you, be aware of where they are. What can I do in the bathroom?      Keep the floor dry. Clean up any water that spills onto the floor as soon as it happens.  Remove soap buildup in the tub or shower regularly.  Use non-skid mats or decals on the floor of the tub or shower.  Attach bath mats securely with double-sided, non-slip rug tape.  If you need to sit down in the shower, use a plastic, non-slip stool.  Install grab bars by the toilet and in the tub and shower. Do not use towel bars as grab bars. What can I do in the bedroom?  Make sure that you have a light by your bed  that is easy to reach.  Do not use any sheets or blankets that are too big for your bed. They should not hang down onto the floor.  Have a firm chair that has side arms. You can use this for support while you get dressed. What can I do in the kitchen?  Clean up any spills right away.  If you need to reach something above you, use a strong step stool that has a grab bar.  Keep electrical cords out of the way.  Do not use floor polish or wax that makes floors slippery. If you must use wax, use non-skid floor wax. What can I do with my stairs?  Do not leave any items on the stairs.  Make sure that you have a light switch at the top of the stairs and the bottom of the stairs. If you do not have them, ask someone to add them for you.  Make sure that there are handrails on both sides of the stairs, and use them. Fix handrails that are broken or loose. Make sure that handrails are as long as the stairways.  Install non-slip stair treads on all stairs in your home.  Avoid having throw rugs at the top or bottom of the stairs. If you do have throw rugs, attach them to the floor with carpet tape.  Choose a carpet that does not hide the edge of the steps on the stairway.  Check any carpeting to make sure that it is firmly attached to the stairs. Fix any carpet that is loose or worn. What can I do on the outside of my home?  Use bright outdoor lighting.  Regularly fix the edges of walkways and driveways and fix any  cracks.  Remove anything that might make you trip as you walk through a door, such as a raised step or threshold.  Trim any bushes or trees on the path to your home.  Regularly check to see if handrails are loose or broken. Make sure that both sides of any steps have handrails.  Install guardrails along the edges of any raised decks and porches.  Clear walking paths of anything that might make someone trip, such as tools or rocks.  Have any leaves, snow, or ice cleared  regularly.  Use sand or salt on walking paths during winter.  Clean up any spills in your garage right away. This includes grease or oil spills. What other actions can I take?  Wear shoes that: ? Have a low heel. Do not wear high heels. ? Have rubber bottoms. ? Are comfortable and fit you well. ? Are closed at the toe. Do not wear open-toe sandals.  Use tools that help you move around (mobility aids) if they are needed. These include: ? Canes. ? Walkers. ? Scooters. ? Crutches.  Review your medicines with your doctor. Some medicines can make you feel dizzy. This can increase your chance of falling. Ask your doctor what other things you can do to help prevent falls. Where to find more information  Centers for Disease Control and Prevention, [employer]AD[employer]: https://garcia.biz/  [employer] on Aging: BrainJudge.co.uk Contact a doctor if:  You are afraid of falling at home.  You feel weak, drowsy, or dizzy at home.  You fall at home. Summary  There are many simple things that you can do to make your home safe and to help prevent falls.  Ways to make your home safe include removing tripping hazards and installing grab bars in the bathroom.  Ask for help when making these changes in your home. This information is not intended to replace advice given to you by your health care provider. Make sure you discuss any questions you have with your health care provider. Document Released: 06/27/2009 Document Revised: 04/15/2017 Document Reviewed: 04/15/2017 Elsevier Interactive Patient Education  2019 [place] Maintenance, Male A healthy lifestyle and preventive care is important for your health and wellness. Ask your health care provider about what schedule of regular examinations is right for you. What should I know about weight and diet? Eat a Healthy Diet  Eat plenty of vegetables, fruits, whole grains, low-fat dairy products, and lean protein.  Do not  eat a lot of foods high in solid fats, added sugars, or salt.  Maintain a Healthy Weight Regular exercise can help you achieve or maintain a healthy weight. You should:  Do at least 150 minutes of exercise each week. The exercise should increase your heart rate and make you sweat (moderate-intensity exercise).  Do strength-training exercises at least twice a week. Watch Your Levels of Cholesterol and Blood Lipids  Have your blood tested for lipids and cholesterol every 5 years starting at 77 years of age. If you are at high risk for heart disease, you should start having your blood tested when you are 78 years old. You may need to have your cholesterol levels checked more often if: ? Your lipid or cholesterol levels are high. ? You are older than 78 years of age. ? You are at high risk for heart disease. What should I know about cancer screening? Many types of cancers can be detected early and may often be prevented. Lung Cancer  You should be screened  every year for lung cancer if: ? You are a current smoker who has smoked for at least 30 years. ? You are a former smoker who has quit within the past 15 years.  Talk to your health care provider about your screening options, when you should start screening, and how often you should be screened. Colorectal Cancer  Routine colorectal cancer screening usually begins at 78 years of age and should be repeated every 5-10 years until you are 78 years old. You may need to be screened more often if early forms of precancerous polyps or small growths are found. Your health care provider may recommend screening at an earlier age if you have risk factors for colon cancer.  Your health care provider may recommend using home test kits to check for hidden blood in the stool.  A small camera at the end of a tube can be used to examine your colon (sigmoidoscopy or colonoscopy). This checks for the earliest forms of colorectal cancer. Prostate and  Testicular Cancer  Depending on your age and overall health, your health care provider may do certain tests to screen for prostate and testicular cancer.  Talk to your health care provider about any symptoms or concerns you have about testicular or prostate cancer. Skin Cancer  Check your skin from head to toe regularly.  Tell your health care provider about any new moles or changes in moles, especially if: ? There is a change in a mole's size, shape, or color. ? You have a mole that is larger than a pencil eraser.  Always use sunscreen. Apply sunscreen liberally and repeat throughout the day.  Protect yourself by wearing long sleeves, pants, a wide-brimmed hat, and sunglasses when outside. What should I know about heart disease, diabetes, and high blood pressure?  If you are 29-24 years of age, have your blood pressure checked every 3-5 years. If you are 64 years of age or older, have your blood pressure checked every year. You should have your blood pressure measured twice-once when you are at a hospital or clinic, and once when you are not at a hospital or clinic. Record the average of the two measurements. To check your blood pressure when you are not at a hospital or clinic, you can use: ? An automated blood pressure machine at a pharmacy. ? A home blood pressure monitor.  Talk to your health care provider about your target blood pressure.  If you are between 43-70 years old, ask your health care provider if you should take aspirin to prevent heart disease.  Have regular diabetes screenings by checking your fasting blood sugar level. ? If you are at a normal weight and have a low risk for diabetes, have this test once every three years after the age of 17. ? If you are overweight and have a high risk for diabetes, consider being tested at a younger age or more often.  A one-time screening for abdominal aortic aneurysm (AAA) by ultrasound is recommended for men aged 44-75 years who  are current or former smokers. What should I know about preventing infection? Hepatitis B If you have a higher risk for hepatitis B, you should be screened for this virus. Talk with your health care provider to find out if you are at risk for hepatitis B infection. Hepatitis C Blood testing is recommended for:  Everyone born from [birth year] through 1965.  Anyone with known risk factors for hepatitis C. Sexually Transmitted Diseases (STDs)  You should be screened  each year for STDs including gonorrhea and chlamydia if: ? You are sexually active and are younger than 78 years of age. ? You are older than 78 years of age and your health care provider tells you that you are at risk for this type of infection. ? Your sexual activity has changed since you were last screened and you are at an increased risk for chlamydia or gonorrhea. Ask your health care provider if you are at risk.  Talk with your health care provider about whether you are at high risk of being infected with HIV. Your health care provider may recommend a prescription medicine to help prevent HIV infection. What else can I do?  Schedule regular health, dental, and eye exams.  Stay current with your vaccines (immunizations).  Do not use any tobacco products, such as cigarettes, chewing tobacco, and e-cigarettes. If you need help quitting, ask your health care provider.  Limit alcohol intake to no more than 2 drinks per day. One drink equals 12 ounces of beer, 5 ounces of wine, or 1 ounces of hard liquor.  Do not use street drugs.  Do not share needles.  Ask your health care provider for help if you need support or information about quitting drugs.  Tell your health care provider if you often feel depressed.  Tell your health care provider if you have ever been abused or do not feel safe at home. This information is not intended to replace advice given to you by your health care provider. Make sure you discuss any questions  you have with your health care provider. Document Released: 02/27/2008 Document Revised: 04/29/2016 Document Reviewed: 06/04/2015 Elsevier Interactive Patient Education  2019 [employer].

## 2019-03-07 ENCOUNTER — Other Ambulatory Visit: Payer: Self-pay | Admitting: Family Medicine

## 2019-03-07 DIAGNOSIS — H6123 Impacted cerumen, bilateral: Secondary | ICD-10-CM | POA: Diagnosis not present

## 2019-04-27 DIAGNOSIS — H938X3 Other specified disorders of ear, bilateral: Secondary | ICD-10-CM | POA: Diagnosis not present

## 2019-07-04 ENCOUNTER — Other Ambulatory Visit: Payer: Self-pay

## 2019-07-04 ENCOUNTER — Ambulatory Visit (INDEPENDENT_AMBULATORY_CARE_PROVIDER_SITE_OTHER): Payer: Medicare PPO

## 2019-07-04 DIAGNOSIS — Z23 Encounter for immunization: Secondary | ICD-10-CM

## 2019-08-24 DIAGNOSIS — H6123 Impacted cerumen, bilateral: Secondary | ICD-10-CM | POA: Diagnosis not present

## 2019-09-07 ENCOUNTER — Other Ambulatory Visit: Payer: Self-pay | Admitting: Family Medicine

## 2019-09-10 ENCOUNTER — Other Ambulatory Visit: Payer: Self-pay | Admitting: Family Medicine

## 2019-10-11 ENCOUNTER — Ambulatory Visit: Payer: Medicare PPO | Admitting: Family Medicine

## 2019-10-12 ENCOUNTER — Ambulatory Visit (INDEPENDENT_AMBULATORY_CARE_PROVIDER_SITE_OTHER): Payer: Medicare PPO

## 2019-10-12 ENCOUNTER — Ambulatory Visit (INDEPENDENT_AMBULATORY_CARE_PROVIDER_SITE_OTHER): Payer: Medicare PPO | Admitting: Family Medicine

## 2019-10-12 ENCOUNTER — Ambulatory Visit: Payer: Medicare PPO

## 2019-10-12 ENCOUNTER — Other Ambulatory Visit: Payer: Self-pay

## 2019-10-12 ENCOUNTER — Encounter: Payer: Self-pay | Admitting: Family Medicine

## 2019-10-12 VITALS — BP 140/80 | HR 75 | Temp 96.5°F | Wt 206.8 lb

## 2019-10-12 DIAGNOSIS — R079 Chest pain, unspecified: Secondary | ICD-10-CM

## 2019-10-12 DIAGNOSIS — E119 Type 2 diabetes mellitus without complications: Secondary | ICD-10-CM | POA: Diagnosis not present

## 2019-10-12 DIAGNOSIS — Z0001 Encounter for general adult medical examination with abnormal findings: Secondary | ICD-10-CM | POA: Diagnosis not present

## 2019-10-12 DIAGNOSIS — R0602 Shortness of breath: Secondary | ICD-10-CM

## 2019-10-12 DIAGNOSIS — Z0000 Encounter for general adult medical examination without abnormal findings: Secondary | ICD-10-CM

## 2019-10-12 LAB — TSH: TSH: 3.04 u[IU]/mL (ref 0.35–4.50)

## 2019-10-12 LAB — LIPID PANEL
Cholesterol: 111 mg/dL (ref 0–200)
HDL: 36.6 mg/dL — ABNORMAL LOW (ref >39.00)
LDL Cholesterol: 43 mg/dL (ref 0–99)
NonHDL: 73.92
Total CHOL/HDL Ratio: 3
Triglycerides: 155 mg/dL — ABNORMAL HIGH (ref 0.0–149.0)
VLDL: 31 mg/dL (ref 0.0–40.0)

## 2019-10-12 LAB — HEPATIC FUNCTION PANEL
ALT: 20 U/L (ref 0–53)
AST: 19 U/L (ref 0–37)
Albumin: 4.4 g/dL (ref 3.5–5.2)
Alkaline Phosphatase: 57 U/L (ref 39–117)
Bilirubin, Direct: 0.3 mg/dL (ref 0.0–0.3)
Total Bilirubin: 1.3 mg/dL — ABNORMAL HIGH (ref 0.2–1.2)
Total Protein: 7.2 g/dL (ref 6.0–8.3)

## 2019-10-12 LAB — BASIC METABOLIC PANEL
BUN: 15 mg/dL (ref 6–23)
CO2: 28 mEq/L (ref 19–32)
Calcium: 9.7 mg/dL (ref 8.4–10.5)
Chloride: 102 mEq/L (ref 96–112)
Creatinine, Ser: 1.19 mg/dL (ref 0.40–1.50)
GFR: 58.99 mL/min — ABNORMAL LOW (ref >60.00)
Glucose, Bld: 187 mg/dL — ABNORMAL HIGH (ref 70–99)
Potassium: 3.9 mEq/L (ref 3.5–5.1)
Sodium: 139 mEq/L (ref 135–145)

## 2019-10-12 LAB — CBC WITH DIFFERENTIAL/PLATELET
Basophils Absolute: 0.1 10*3/uL (ref 0.0–0.1)
Basophils Relative: 0.7 % (ref 0.0–3.0)
Eosinophils Absolute: 0.3 10*3/uL (ref 0.0–0.7)
Eosinophils Relative: 2.9 % (ref 0.0–5.0)
HCT: 43.7 % (ref 39.0–52.0)
Hemoglobin: 15.5 g/dL (ref 13.0–17.0)
Lymphocytes Relative: 27.5 % (ref 12.0–46.0)
Lymphs Abs: 2.6 10*3/uL (ref 0.7–4.0)
MCHC: 35.4 g/dL (ref 30.0–36.0)
MCV: 88.4 fl (ref 78.0–100.0)
Monocytes Absolute: 0.9 10*3/uL (ref 0.1–1.0)
Monocytes Relative: 9.9 % (ref 3.0–12.0)
Neutro Abs: 5.5 10*3/uL (ref 1.4–7.7)
Neutrophils Relative %: 59 % (ref 43.0–77.0)
Platelets: 171 10*3/uL (ref 150.0–400.0)
RBC: 4.94 Mil/uL (ref 4.22–5.81)
RDW: 12.9 % (ref 11.5–15.5)
WBC: 9.3 10*3/uL (ref 4.0–10.5)

## 2019-10-12 LAB — HEMOGLOBIN A1C: Hgb A1c MFr Bld: 7.5 % — ABNORMAL HIGH (ref 4.6–6.5)

## 2019-10-12 LAB — PSA: PSA: 1.71 ng/mL (ref 0.10–4.00)

## 2019-10-12 NOTE — Progress Notes (Signed)
Subjective:    Patient ID: Bobby Lopez, male    DOB: November 30, 1940, 79 y.o.   MRN: [id number]  HPI Here for a well exam. His only concern is for 3 months he has had some mild SOB and chest pain on exertion, such as walking up stairs. The pain is mild, sharp, and lasts only a few seconds. The pain occurs in the left anterior chest area near the sternum. No cough or wheezing or fever.    Review of Systems  Constitutional: Negative.   HENT: Negative.   Eyes: Negative.   Respiratory: Positive for shortness of breath.   Cardiovascular: Positive for chest pain.  Gastrointestinal: Negative.   Genitourinary: Negative.   Musculoskeletal: Negative.   Skin: Negative.   Neurological: Negative.   Psychiatric/Behavioral: Negative.        Objective:   Physical Exam Constitutional:      General: He is not in acute distress.    Appearance: Normal appearance. He is well-developed. He is not diaphoretic.  HENT:     Head: Normocephalic and atraumatic.     Right Ear: External ear normal.     Left Ear: External ear normal.     Nose: Nose normal.     Mouth/Throat:     Pharynx: No oropharyngeal exudate.  Eyes:     General: No scleral icterus.       Right eye: No discharge.        Left eye: No discharge.     Conjunctiva/sclera: Conjunctivae normal.     Pupils: Pupils are equal, round, and reactive to light.  Neck:     Thyroid: No thyromegaly.     Vascular: No JVD.     Trachea: No tracheal deviation.  Cardiovascular:     Rate and Rhythm: Normal rate and regular rhythm.     Pulses: Normal pulses.     Heart sounds: Normal heart sounds. No murmur. No friction rub. No gallop.      Comments: EKG is normal  Pulmonary:     Effort: Pulmonary effort is normal. No respiratory distress.     Breath sounds: Normal breath sounds. No wheezing or rales.  Chest:     Chest wall: No tenderness.  Abdominal:     General: Bowel sounds are normal. There is no distension.     Palpations: Abdomen is soft.  There is no mass.     Tenderness: There is no abdominal tenderness. There is no guarding or rebound.  Genitourinary:    Penis: Normal. No tenderness.      Testes: Normal.     Prostate: Normal.     Rectum: Normal. Guaiac result negative.  Musculoskeletal:        General: No tenderness. Normal range of motion.     Cervical back: Neck supple.  Lymphadenopathy:     Cervical: No cervical adenopathy.  Skin:    General: Skin is warm and dry.     Coloration: Skin is not pale.     Findings: No erythema or rash.  Neurological:     Mental Status: He is alert and oriented to person, place, and time.     Cranial Nerves: No cranial nerve deficit.     Motor: No abnormal muscle tone.     Coordination: Coordination normal.     Deep Tendon Reflexes: Reflexes are normal and symmetric. Reflexes normal.  Psychiatric:        Behavior: Behavior normal.        Thought Content: Thought content normal.  Judgment: Judgment normal.           Assessment & Plan:  Well exam. We discussed diet an exercise. Get fasting labs. For the chest pain and SOB we will get a CXR today. Also we will set up a Myoview stress test in the near future. He already takes an 81 mg ASA daily.  Alysia Penna, MD

## 2019-10-12 NOTE — Addendum Note (Signed)
Addended by: Suzette Battiest on: 10/12/2019 09:41 AM   Modules accepted: Orders

## 2019-10-17 ENCOUNTER — Other Ambulatory Visit: Payer: Self-pay

## 2019-10-17 MED ORDER — GLIPIZIDE 5 MG PO TABS: 5.0000 mg | ORAL_TABLET | Freq: Two times a day (BID) | ORAL | 5 refills | Status: DC

## 2019-12-02 ENCOUNTER — Other Ambulatory Visit: Payer: Self-pay | Admitting: Family Medicine

## 2019-12-07 ENCOUNTER — Other Ambulatory Visit: Payer: Self-pay | Admitting: Family Medicine

## 2019-12-08 ENCOUNTER — Telehealth: Payer: Self-pay | Admitting: Family Medicine

## 2019-12-08 NOTE — Telephone Encounter (Signed)
Call in Diclofenac 75 mg to take BID as needed for joint pains, #60 with 5 rf

## 2019-12-08 NOTE — Telephone Encounter (Signed)
Pt wife wants to know if Dr fry can prescribe her husband the patient an RX for diclofenac sodium because he prescribes it for her, and it helped with her joint pain; please call  [phone]

## 2019-12-08 NOTE — Telephone Encounter (Signed)
Please advise 

## 2019-12-09 MED ORDER — DICLOFENAC SODIUM 75 MG PO TBEC: 75.0000 mg | DELAYED_RELEASE_TABLET | Freq: Two times a day (BID) | ORAL | 5 refills | Status: DC

## 2019-12-09 NOTE — Telephone Encounter (Signed)
Pt has been notified of medication being sent in

## 2019-12-11 ENCOUNTER — Telehealth: Payer: Self-pay | Admitting: Family Medicine

## 2019-12-11 DIAGNOSIS — E119 Type 2 diabetes mellitus without complications: Secondary | ICD-10-CM

## 2019-12-11 DIAGNOSIS — I10 Essential (primary) hypertension: Secondary | ICD-10-CM

## 2019-12-11 NOTE — Chronic Care Management (AMB) (Signed)
°  Chronic Care Management   Note  12/11/2019 Name: [name] MRN: [id number] DOB: [birth year]  [name] is a 79 y.o. year old male who is a primary care patient of Laurey Morale, MD. I reached out to Burton Apley by phone today in response to a referral sent by [name]'s PCP, Laurey Morale, MD.   [name] was given information about Chronic Care Management services today including:  1. CCM service includes personalized support from designated clinical staff supervised by his physician, including individualized plan of care and coordination with other care providers 2. 24/7 contact phone numbers for assistance for urgent and routine care needs. 3. Service will only be billed when office clinical staff spend 20 minutes or more in a month to coordinate care. 4. Only one practitioner may furnish and bill the service in a calendar month. 5. The patient may stop CCM services at any time (effective at the end of the month) by phone call to the office staff.   Patient agreed to services and verbal consent obtained.   Follow up plan:   Raynicia Dukes [employer] Scheduler

## 2019-12-29 NOTE — Addendum Note (Signed)
Addended by: Alysia Penna A on: 12/29/2019 07:48 AM   Modules accepted: Orders

## 2020-01-03 ENCOUNTER — Other Ambulatory Visit: Payer: Self-pay

## 2020-01-03 ENCOUNTER — Ambulatory Visit: Payer: Medicare PPO

## 2020-01-03 DIAGNOSIS — E782 Mixed hyperlipidemia: Secondary | ICD-10-CM

## 2020-01-03 DIAGNOSIS — M1990 Unspecified osteoarthritis, unspecified site: Secondary | ICD-10-CM

## 2020-01-03 DIAGNOSIS — E119 Type 2 diabetes mellitus without complications: Secondary | ICD-10-CM

## 2020-01-03 DIAGNOSIS — I10 Essential (primary) hypertension: Secondary | ICD-10-CM

## 2020-01-03 NOTE — Chronic Care Management (AMB) (Signed)
Chronic Care Management Pharmacy  Name: [name]  MRN: [id number] DOB: 02/28/1941  Initial Questions: 1. Have you seen any other providers since your last visit? NA 2. Any changes in your medicines or health? No   Chief Complaint/ HPI  Bobby Lopez,  79 y.o. , male presents for their Initial CCM visit with the clinical pharmacist via telephone due to COVID-19 Pandemic.  PCP : Laurey Morale, MD  Their chronic conditions include: Diabetes, HTN, HLD, osteoarthritis  Office Visits: 10/12/2019- Alysia Penna, MD- Patient presented for office visit for annual exam and chest pain. Patient to obtain fasting labs. For chest pain and short of breath, patient to obtain chest x-ray as well as Myoview stress test in the future.   Consult Visit: 08/24/2019- Otolaryngology- Barbaraann Share Ingegerd Catarina Hartshorn, [place]- Patient presented for office visit for removal of cerumen impaction. Patient to follow up in 4 months for routine ear cleaning.   Medications: Outpatient Encounter Medications as of 01/03/2020  Medication Sig  . amLODipine (NORVASC) 5 MG tablet TAKE ONE TABLET BY MOUTH EVERY MORNING  . aspirin 81 MG tablet Take 81 mg by mouth daily.   Marland Kitchen atorvastatin (LIPITOR) 20 MG tablet TAKE ONE TABLET BY MOUTH EVERY EVENING  . diclofenac (VOLTAREN) 75 MG EC tablet Take 1 tablet (75 mg total) by mouth 2 (two) times daily. As needed for pain  . glipiZIDE (GLUCOTROL) 5 MG tablet Take 1 tablet (5 mg total) by mouth 2 (two) times daily before a meal.  . hydrochlorothiazide (MICROZIDE) 12.5 MG capsule TAKE ONE CAPSULE BY MOUTH DAILY  . losartan (COZAAR) 50 MG tablet TAKE ONE TABLET BY MOUTH DAILY  . metFORMIN (GLUCOPHAGE) 500 MG tablet TAKE ONE TABLET BY MOUTH TWICE A DAY WITH A MEAL  . Multiple Vitamin (MULTIVITAMIN WITH MINERALS) TABS tablet Take 1 tablet by mouth daily. Centrum silver  . Omega-3 Fatty Acids (FISH OIL PO) Take by mouth daily.  . Probiotic Product (ALIGN PO) Take 1 tablet by mouth  daily.   Marland Kitchen docusate sodium (COLACE) 100 MG capsule Take 1 capsule (100 mg total) by mouth 2 (two) times daily as needed (take to keep stool soft.). (Patient taking differently: Take 100 mg by mouth daily. )   Facility-Administered Encounter Medications as of 01/03/2020  Medication  . 0.9 %  sodium chloride infusion     Current Diagnosis/Assessment:  Goals Addressed            This Visit's Progress   . Pharmacy Care Plan       CARE PLAN ENTRY  Current Barriers:  . Chronic Disease Management support, education, and care coordination needs related to Hypertension, Hyperlipidemia, Diabetes, and Osteoarthritis  Hypertension (high blood pressure) . Current antihypertensive regimen:   Amlodipine 5mg , 1 tablet every morning   Hydrochlorothiazide 12.5mg , 1 capsule once daily   Losartan 50mg , 1 tablet once daily  . Previous antihypertensives tried: benazepril (stopped due to cough) . Last practice recorded BP readings:  BP Readings from Last 3 Encounters:  10/12/19 140/80  10/10/18 126/72  10/10/18 126/72 .  Current home BP readings: patient does not check at home   Hyperlipidemia (high cholesterol) . Current antihyperlipidemic regimen: atorvastatin 20mg , 1 tablet every evening . Previous antihyperlipidemic medications tried: none . Most recent lipid panel:     Component Value Date/Time   CHOL 111 10/12/2019 0933   TRIG 155.0 (H) 10/12/2019 0933   HDL 36.60 (L) 10/12/2019 0933   CHOLHDL 3 10/12/2019 0933   VLDL 31.0  10/12/2019 0933   LDLCALC 43 10/12/2019 0933   Diabetes Lab Results  Component Value Date   HGBA1C 7.5 (H) 10/12/2019 .  Current antihyperglycemic regimen:   metformin 500mg , 1 tablet twice daily with a meal    glipizide 5mg , 1 tablet twice daily before a meal  . Current meal patterns: patient stated decreasing soda intake as well as daily sugars . Current exercise: walking 6 miles per day, 7 days per week.  Pharmacist Clinical Goal(s):   Marland Kitchen Hypertension o Over the next 90 days, patient will maintain blood pressure; <140/90 . Hyperlipidemia o Over the next 180 days, patient will continue with diet and exercise modifications to lower ASCVD risk and maintain cholesterol levels at goal: . Cholesterol goals: Total Cholesterol goal under 200, Triglycerides goal under 150, HDL goal above 40 (men) or above 50 (women), LDL goal under 100.  . Diabetes o Over the next the month, patient will return for follow-up visit with Dr. Sarajane Jews for repeat A1c.  o A1c goal: <7.0%  Interventions: . Comprehensive medication review performed; medication list updated in electronic medical record.  . Hypertension . Discussed diet and exercise and affect on blood pressure.  Marland Kitchen DASH diet: following a diet emphasizing fruits and vegetables and low-fat dairy products along with whole grains, fish, poultry, and nuts. Reducing red meats and sugars.  . Hyperlipidemia . How to reduce cholesterol through diet/weight management and physical activity.    . We discussed how a diet high in plant sterols (fruits/vegetables/nuts/whole grains/legumes) may reduce your cholesterol.  Encouraged increasing fiber to a daily intake of 10-25g/day  . Diabetes o Discussed importance of preventative diabetic health exams.  Patient Self Care Activities:  . Patient verbalizes understanding of plan as described above, Self administers medications as prescribed, Calls pharmacy for medication refills, and Calls provider office for new concerns or questions . Continue current medications as directed by providers.  . Continue following up with primary care provider and/or specialists. . Continue routine of healthy habits (diet/ exercise).  Initial goal documentation       SDOH Interventions     Most Recent Value  SDOH Interventions  SDOH Interventions for the Following Domains  Transportation  Transportation Interventions  Other (Comment) [Not needed .]      Diabetes    Recent Relevant Labs: Lab Results  Component Value Date/Time   HGBA1C 7.5 (H) 10/12/2019 09:33 AM   HGBA1C 7.2 (H) 10/10/2018 09:54 AM   MICROALBUR 1.2 03/10/2016 08:30 AM   MICROALBUR 1.3 09/05/2015 08:03 AM   Patient has failed these meds in past: none   Patient is currently uncontrolled on the following medications:   metformin 500mg , 1 tablet twice daily with a meal    glipizide 5mg , 1 tablet twice daily before a meal (added on 10/17/2019).   Last diabetic Eye exam:  Lab Results  Component Value Date/Time   HMDIABEYEEXA No Retinopathy 08/24/2014 02:14 PM  - patient states he obtains every 2 years. (next appt scheduled for next year: 09/17/2020)   Last diabetic Foot exam: No results found for: HMDIABFOOTEX - patient states at last visit, Dr. Sarajane Jews inspected his feet.   We discussed: diet and exercise extensively  . Preventative diabetic health exams . Diet: . Patient stated since last A1c, he has decreased coke intake as well as sugars.  . Exercise: . Patient stated [place]: about  6 miles per day,7 days per week.   Plan Patient to follow up on 01/16/20 with Dr. Sarajane Jews  for repeat A1c.  Continue current medications and control with diet and exercise   Hypertension  Patient denied dizziness/ lightheadedness.  BP today is:  <140/90  Office blood pressures are  BP Readings from Last 3 Encounters:  10/12/19 140/80  10/10/18 126/72  10/10/18 126/72   Patient has failed these meds in the past: benazepril (cough)  Patient checks BP at home does not check at home   Patient home BP readings are ranging: NA- does not check at home.   Patient is controlled on:   amlodipine 5mg , 1 tablet every morning   HCTZ 12.5mg , 1 capsule once daily   losartan 50mg , 1 tablet once daily   We discussed diet and exercise extensively  . DASH diet:  following a diet emphasizing fruits and vegetables and low-fat dairy products along with whole grains, fish,  poultry, and nuts. Reducing red meats and sugars.  . Exercising  . Reducing the amount of salt intake to 1500mg /per day.   Plan Continue current medications.   Hyperlipidemia   Lipid Panel     Component Value Date/Time   CHOL 111 10/12/2019 0933   TRIG 155.0 (H) 10/12/2019 0933   HDL 36.60 (L) 10/12/2019 0933   CHOLHDL 3 10/12/2019 0933   VLDL 31.0 10/12/2019 0933   LDLCALC 43 10/12/2019 0933    The ASCVD Risk score (Goff DC Jr., et al., 2013) failed to calculate for the following reasons:   The valid total cholesterol range is 130 to 320 mg/dL   Patient has failed these meds in past: none    Patient is currently controlled on the following medications:   atorvastatin 20mg , 1 tablet every evening  We discussed:  diet and exercise extensively  . How to reduce cholesterol through diet/weight management and physical activity.    . We discussed how a diet high in plant sterols (fruits/vegetables/nuts/whole grains/legumes) may reduce your cholesterol.  Encouraged increasing fiber to a daily intake of 10-25g/day   Plan Continue current medications  Aspirin therapy  Patient denies history of MI/ stroke/ other cardiac event/ condition  Patient is currently on the following medications:   aspirin 81mg , 1 tablet once daily   We discussed:   Benefits/ risks of primary prevention of aspirin   Plan Continue current medications  Osteoarthritis   Patient has failed these meds in past: none  Patient is currently controlled on the following medications:   diclofenac 75mg , 1 tablet twice daily as needed   Plan Continue current medications  OTC/ supplements   Patient is currentlyon the following medications:   Multivitamin, 1 tablet once daily  Omega-3 fatty acids (fish oil), 1 capsule once daily  Probiotic, 1 tablet once daily  Docusate 100mg , 1 tablet daily as needed   Medication Management  Adherence: no gaps in refill history (per medication dispense history  from 07/15/2019 to 01/03/2020)    Follow up  Follow up visit with PharmD in 6 months. Will conduct general telephone calls for periodic check-ins before next visit.   Anson Crofts, PharmD Clinical Pharmacist [facility] [phone]

## 2020-01-12 NOTE — Patient Instructions (Addendum)
Visit Information  Goals Addressed            This Visit's Progress   . Pharmacy Care Plan       CARE PLAN ENTRY  Current Barriers:  . Chronic Disease Management support, education, and care coordination needs related to Hypertension, Hyperlipidemia, Diabetes, and Osteoarthritis  Hypertension (high blood pressure) . Current antihypertensive regimen:   Amlodipine 5mg , 1 tablet every morning   Hydrochlorothiazide 12.5mg , 1 capsule once daily   Losartan 50mg , 1 tablet once daily  . Previous antihypertensives tried: benazepril (stopped due to cough) . Last practice recorded BP readings:  BP Readings from Last 3 Encounters:  10/12/19 140/80  10/10/18 126/72  10/10/18 126/72 .  Current home BP readings: patient does not check at home   Hyperlipidemia (high cholesterol) . Current antihyperlipidemic regimen: atorvastatin 20mg , 1 tablet every evening . Previous antihyperlipidemic medications tried: none . Most recent lipid panel:     Component Value Date/Time   CHOL 111 10/12/2019 0933   TRIG 155.0 (H) 10/12/2019 0933   HDL 36.60 (L) 10/12/2019 0933   CHOLHDL 3 10/12/2019 0933   VLDL 31.0 10/12/2019 0933   LDLCALC 43 10/12/2019 0933   Diabetes Lab Results  Component Value Date   HGBA1C 7.5 (H) 10/12/2019 .  Current antihyperglycemic regimen:   metformin 500mg , 1 tablet twice daily with a meal    glipizide 5mg , 1 tablet twice daily before a meal  . Current meal patterns: patient stated decreasing soda intake as well as daily sugars . Current exercise: walking 6 miles per day, 7 days per week.  Pharmacist Clinical Goal(s):  Marland Kitchen Hypertension o Over the next 90 days, patient will maintain blood pressure; <140/90 . Hyperlipidemia o Over the next 180 days, patient will continue with diet and exercise modifications to lower ASCVD risk and maintain cholesterol levels at goal: . Cholesterol goals: Total Cholesterol goal under 200, Triglycerides goal under 150, HDL goal above  40 (men) or above 50 (women), LDL goal under 100.  . Diabetes o Over the next the month, patient will return for follow-up visit with Dr. Sarajane Jews for repeat A1c.  o A1c goal: <7.0%  Interventions: . Comprehensive medication review performed; medication list updated in electronic medical record.  . Hypertension . Discussed diet and exercise and affect on blood pressure.  Marland Kitchen DASH diet: following a diet emphasizing fruits and vegetables and low-fat dairy products along with whole grains, fish, poultry, and nuts. Reducing red meats and sugars.  . Hyperlipidemia . How to reduce cholesterol through diet/weight management and physical activity.    . We discussed how a diet high in plant sterols (fruits/vegetables/nuts/whole grains/legumes) may reduce your cholesterol.  Encouraged increasing fiber to a daily intake of 10-25g/day  . Diabetes o Discussed importance of preventative diabetic health exams.  Patient Self Care Activities:  . Patient verbalizes understanding of plan as described above, Self administers medications as prescribed, Calls pharmacy for medication refills, and Calls provider office for new concerns or questions . Continue current medications as directed by providers.  . Continue following up with primary care provider and/or specialists. . Continue routine of healthy habits (diet/ exercise).  Initial goal documentation        Bobby Lopez was given information about Chronic Care Management services today including:  1. CCM service includes personalized support from designated clinical staff supervised by his physician, including individualized plan of care and coordination with other care providers 2. 24/7 contact phone numbers for assistance for urgent and routine  care needs. 3. Standard insurance, coinsurance, copays and deductibles apply for chronic care management only during months in which we provide at least 20 minutes of these services. Most insurances cover these  services at 100%, however patients may be responsible for any copay, coinsurance and/or deductible if applicable. This service may help you avoid the need for more expensive face-to-face services. 4. Only one practitioner may furnish and bill the service in a calendar month. 5. The patient may stop CCM services at any time (effective at the end of the month) by phone call to the office staff.  Patient agreed to services and verbal consent obtained.   The patient verbalized understanding of instructions provided today and agreed to receive a mailed copy of patient instruction and/or educational materials. Telephone follow up appointment with pharmacy team member scheduled for: 07/04/2020  Anson Crofts, PharmD Clinical Pharmacist [facility] [place] [phone]    Diabetes Mellitus and Nutrition, Adult When you have diabetes (diabetes mellitus), it is very important to have healthy eating habits because your blood sugar (glucose) levels are greatly affected by what you eat and drink. Eating healthy foods in the appropriate amounts, at about the same times every day, can help you:  Control your blood glucose.  Lower your risk of heart disease.  Improve your blood pressure.  Reach or maintain a healthy weight. Every person with diabetes is different, and each person has different needs for a meal plan. Your health care provider may recommend that you work with a diet and nutrition specialist (dietitian) to make a meal plan that is best for you. Your meal plan may vary depending on factors such as:  The calories you need.  The medicines you take.  Your weight.  Your blood glucose, blood pressure, and cholesterol levels.  Your activity level.  Other health conditions you have, such as heart or kidney disease. How do carbohydrates affect me? Carbohydrates, also called carbs, affect your blood glucose level more than any other type of food. Eating carbs  naturally raises the amount of glucose in your blood. Carb counting is a method for keeping track of how many carbs you eat. Counting carbs is important to keep your blood glucose at a healthy level, especially if you use insulin or take certain oral diabetes medicines. It is important to know how many carbs you can safely have in each meal. This is different for every person. Your dietitian can help you calculate how many carbs you should have at each meal and for each snack. Foods that contain carbs include:  Bread, cereal, rice, pasta, and crackers.  Potatoes and corn.  Peas, beans, and lentils.  Milk and yogurt.  Fruit and juice.  Desserts, such as cakes, cookies, ice cream, and candy. How does alcohol affect me? Alcohol can cause a sudden decrease in blood glucose (hypoglycemia), especially if you use insulin or take certain oral diabetes medicines. Hypoglycemia can be a life-threatening condition. Symptoms of hypoglycemia (sleepiness, dizziness, and confusion) are similar to symptoms of having too much alcohol. If your health care provider says that alcohol is safe for you, follow these guidelines:  Limit alcohol intake to no more than 1 drink per day for nonpregnant women and 2 drinks per day for men. One drink equals 12 oz of beer, 5 oz of wine, or 1 oz of hard liquor.  Do not drink on an empty stomach.  Keep yourself hydrated with water, diet soda, or unsweetened iced tea.  Keep  in mind that regular soda, juice, and other mixers may contain a lot of sugar and must be counted as carbs. What are tips for following this plan?  Reading food labels  Start by checking the serving size on the "Nutrition Facts" label of packaged foods and drinks. The amount of calories, carbs, fats, and other nutrients listed on the label is based on one serving of the item. Many items contain more than one serving per package.  Check the total grams (g) of carbs in one serving. You can calculate  the number of servings of carbs in one serving by dividing the total carbs by 15. For example, if a food has 30 g of total carbs, it would be equal to 2 servings of carbs.  Check the number of grams (g) of saturated and trans fats in one serving. Choose foods that have low or no amount of these fats.  Check the number of milligrams (mg) of salt (sodium) in one serving. Most people should limit total sodium intake to less than 2,300 mg per day.  Always check the nutrition information of foods labeled as "low-fat" or "nonfat". These foods may be higher in added sugar or refined carbs and should be avoided.  Talk to your dietitian to identify your daily goals for nutrients listed on the label. Shopping  Avoid buying canned, premade, or processed foods. These foods tend to be high in fat, sodium, and added sugar.  Shop around the outside edge of the grocery store. This includes fresh fruits and vegetables, bulk grains, fresh meats, and fresh dairy. Cooking  Use low-heat cooking methods, such as baking, instead of high-heat cooking methods like deep frying.  Cook using healthy oils, such as olive, canola, or sunflower oil.  Avoid cooking with butter, cream, or high-fat meats. Meal planning  Eat meals and snacks regularly, preferably at the same times every day. Avoid going long periods of time without eating.  Eat foods high in fiber, such as fresh fruits, vegetables, beans, and whole grains. Talk to your dietitian about how many servings of carbs you can eat at each meal.  Eat 4-6 ounces (oz) of lean protein each day, such as lean meat, chicken, fish, eggs, or tofu. One oz of lean protein is equal to: ? 1 oz of meat, chicken, or fish. ? 1 egg. ?  cup of tofu.  Eat some foods each day that contain healthy fats, such as avocado, nuts, seeds, and fish. Lifestyle  Check your blood glucose regularly.  Exercise regularly as told by your health care provider. This may include: ? 150  minutes of moderate-intensity or vigorous-intensity exercise each week. This could be brisk walking, biking, or water aerobics. ? Stretching and doing strength exercises, such as yoga or weightlifting, at least 2 times a week.  Take medicines as told by your health care provider.  Do not use any products that contain nicotine or tobacco, such as cigarettes and e-cigarettes. If you need help quitting, ask your health care provider.  Work with a Social worker or diabetes educator to identify strategies to manage stress and any emotional and social challenges. Questions to ask a health care provider  Do I need to meet with a diabetes educator?  Do I need to meet with a dietitian?  What number can I call if I have questions?  When are the best times to check my blood glucose? Where to find more information:  American Diabetes Association: diabetes.org  Academy of Nutrition and Dietetics: www.eatright.org  [employer] of Diabetes and Digestive and Kidney Diseases (NIH): [employer] Summary  A healthy meal plan will help you control your blood glucose and maintain a healthy lifestyle.  Working with a diet and nutrition specialist (dietitian) can help you make a meal plan that is best for you.  Keep in mind that carbohydrates (carbs) and alcohol have immediate effects on your blood glucose levels. It is important to count carbs and to use alcohol carefully. This information is not intended to replace advice given to you by your health care provider. Make sure you discuss any questions you have with your health care provider. Document Revised: 08/13/2017 Document Reviewed: 10/05/2016 Elsevier Patient Education  2020 [employer].

## 2020-01-16 ENCOUNTER — Encounter: Payer: Self-pay | Admitting: Family Medicine

## 2020-01-16 ENCOUNTER — Telehealth (INDEPENDENT_AMBULATORY_CARE_PROVIDER_SITE_OTHER): Payer: Medicare PPO | Admitting: Family Medicine

## 2020-01-16 ENCOUNTER — Other Ambulatory Visit: Payer: Self-pay

## 2020-01-16 VITALS — BP 120/62 | HR 66 | Temp 97.3°F | Wt 200.2 lb

## 2020-01-16 DIAGNOSIS — E119 Type 2 diabetes mellitus without complications: Secondary | ICD-10-CM

## 2020-01-16 LAB — POCT GLYCOSYLATED HEMOGLOBIN (HGB A1C): Hemoglobin A1C: 5.4 % (ref 4.0–5.6)

## 2020-01-16 MED ORDER — GLIPIZIDE 5 MG PO TABS: 5.0000 mg | ORAL_TABLET | Freq: Two times a day (BID) | ORAL | 3 refills | Status: DC

## 2020-01-16 MED ORDER — DICLOFENAC SODIUM 75 MG PO TBEC: 75.0000 mg | DELAYED_RELEASE_TABLET | Freq: Two times a day (BID) | ORAL | 3 refills | Status: DC

## 2020-01-16 NOTE — Progress Notes (Signed)
   Subjective:    Patient ID: Bobby Lopez, male    DOB: Apr 18, 1941, 79 y.o.   MRN: [id number]  HPI Here to follow up on diabetes. At his well exam 3 months ago his A1c was 7.5 and we added Glipizide to his regimen. He has done well since then and he feels fine. Today the A1c is down to 5.4.    Review of Systems  Constitutional: Negative.   Respiratory: Negative.   Cardiovascular: Negative.        Objective:   Physical Exam Constitutional:      Appearance: Normal appearance.  Cardiovascular:     Rate and Rhythm: Normal rate and regular rhythm.     Pulses: Normal pulses.     Heart sounds: Normal heart sounds.  Pulmonary:     Effort: Pulmonary effort is normal.     Breath sounds: Normal breath sounds.  Neurological:     Mental Status: He is alert.           Assessment & Plan:  Type 2 diabetes, now well controlled. Stay on the current medications.  Alysia Penna, MD

## 2020-02-13 ENCOUNTER — Other Ambulatory Visit: Payer: Self-pay | Admitting: Family Medicine

## 2020-02-13 DIAGNOSIS — H6123 Impacted cerumen, bilateral: Secondary | ICD-10-CM | POA: Diagnosis not present

## 2020-03-02 ENCOUNTER — Other Ambulatory Visit: Payer: Self-pay | Admitting: Family Medicine

## 2020-04-18 ENCOUNTER — Telehealth: Payer: Self-pay | Admitting: Family Medicine

## 2020-04-18 NOTE — Telephone Encounter (Signed)
Spoke with patient to scheduled AWV he declined

## 2020-05-06 ENCOUNTER — Telehealth: Payer: Self-pay | Admitting: Family Medicine

## 2020-05-06 NOTE — Telephone Encounter (Signed)
Spoke with patient he declined the AWV this year

## 2020-05-30 ENCOUNTER — Other Ambulatory Visit: Payer: Self-pay | Admitting: Family Medicine

## 2020-07-01 ENCOUNTER — Other Ambulatory Visit: Payer: Self-pay

## 2020-07-01 ENCOUNTER — Ambulatory Visit (INDEPENDENT_AMBULATORY_CARE_PROVIDER_SITE_OTHER): Payer: Medicare PPO | Admitting: *Deleted

## 2020-07-01 DIAGNOSIS — Z23 Encounter for immunization: Secondary | ICD-10-CM | POA: Diagnosis not present

## 2020-07-04 ENCOUNTER — Ambulatory Visit: Payer: Medicare PPO

## 2020-07-04 DIAGNOSIS — I10 Essential (primary) hypertension: Secondary | ICD-10-CM

## 2020-07-04 DIAGNOSIS — E119 Type 2 diabetes mellitus without complications: Secondary | ICD-10-CM

## 2020-07-04 NOTE — Chronic Care Management (AMB) (Signed)
Chronic Care Management Pharmacy  Name: Bobby Lopez  MRN: [id number] DOB: [birth year]  Initial Questions: 1. Have you seen any other providers since your last visit? NA 2. Any changes in your medicines or health? No   Chief Complaint/ HPI  Bobby Lopez,  79 y.o. , male presents for their Follow-Up CCM visit with the clinical pharmacist via telephone due to COVID-19 Pandemic.  PCP : Laurey Morale, MD  Their chronic conditions include: Diabetes, HTN, HLD, osteoarthritis  Office Visits: 01/16/20 Alysia Penna, MD: Patient presented for follow up for diabetes. A1C repeated and decreased to 5.4%. Continued current medications.  10/12/2019- Alysia Penna, MD- Patient presented for office visit for annual exam and chest pain. Patient to obtain fasting labs. For chest pain and short of breath, patient to obtain chest x-ray as well as Myoview stress test in the future.   Consult Visit: 02/13/20 Jolene Provost (otolaryngology): Patient presented office visit for removal of cerumen impaction. Patient to follow up in 4 months for routine ear cleaning.   08/24/2019- Otolaryngology- Barbaraann Share Ingegerd Catarina Hartshorn, [place]- Patient presented for office visit for removal of cerumen impaction. Patient to follow up in 4 months for routine ear cleaning.   Medications: Outpatient Encounter Medications as of 07/04/2020  Medication Sig  . amLODipine (NORVASC) 5 MG tablet TAKE ONE TABLET BY MOUTH EVERY MORNING  . aspirin 81 MG tablet Take 81 mg by mouth daily.   Marland Kitchen atorvastatin (LIPITOR) 20 MG tablet TAKE ONE TABLET BY MOUTH EVERY EVENING  . diclofenac (VOLTAREN) 75 MG EC tablet Take 1 tablet (75 mg total) by mouth 2 (two) times daily. As needed for pain  . docusate sodium (COLACE) 100 MG capsule Take 1 capsule (100 mg total) by mouth 2 (two) times daily as needed (take to keep stool soft.). (Patient taking differently: Take 100 mg by mouth daily. )  . glipiZIDE (GLUCOTROL) 5 MG tablet TAKE ONE TABLET BY  MOUTH TWICE A DAY BEFORE A MEAL  . hydrochlorothiazide (MICROZIDE) 12.5 MG capsule TAKE ONE CAPSULE BY MOUTH DAILY  . losartan (COZAAR) 50 MG tablet TAKE ONE TABLET BY MOUTH DAILY  . metFORMIN (GLUCOPHAGE) 500 MG tablet TAKE ONE TABLET BY MOUTH TWICE A DAY WITH MEALS  . Multiple Vitamin (MULTIVITAMIN WITH MINERALS) TABS tablet Take 1 tablet by mouth daily. Centrum silver  . Omega-3 Fatty Acids (FISH OIL PO) Take by mouth daily.  . Probiotic Product (ALIGN PO) Take 1 tablet by mouth daily.    Facility-Administered Encounter Medications as of 07/04/2020  Medication  . 0.9 %  sodium chloride infusion     Current Diagnosis/Assessment:  Goals Addressed   None      Diabetes  A1c goal < 7%  Recent Relevant Labs: Lab Results  Component Value Date/Time   HGBA1C 5.4 01/16/2020 07:56 AM   HGBA1C 7.5 (H) 10/12/2019 09:33 AM   HGBA1C 7.2 (H) 10/10/2018 09:54 AM   MICROALBUR 1.2 03/10/2016 08:30 AM   MICROALBUR 1.3 09/05/2015 08:03 AM   Patient has failed these meds in past: none  Patient is currently uncontrolled on the following medications:   metformin 500 mg, 1 tablet twice daily with a meal    glipizide 5 mg, 1 tablet twice daily before a meal (added on 10/17/2019)  Last diabetic Eye exam:  Lab Results  Component Value Date/Time   HMDIABEYEEXA No Retinopathy 08/24/2014 02:14 PM  - patient states he obtains every 2 years. (next appt scheduled for next year: 09/17/2020)   Last diabetic  Foot exam: No results found for: HMDIABFOOTEX - patient states at last visit, Dr. Sarajane Jews inspected his feet.   We discussed: diet and exercise extensively and how to recognize and treat signs of hypoglycemia  . Preventative diabetic health exams . Patient denies symptoms of hypoglycemia. . Diet: . Patient stated since last A1c, he has decreased coke intake as well as sugars.  . Exercise: . Patient stated he walks around neighborhood for about 20 minutes but not every day as he has a bad hip  and this limits him  Plan Discussed with Dr. Sarajane Jews about potentially having lows with A1c of 5.4%. Plan to repeat A1c in January and consider decreasing glipizide if still < 6%.  Continue current medications and control with diet and exercise   Hypertension  Patient denied dizziness/ lightheadedness.  BP today is:  <140/90  Office blood pressures are  BP Readings from Last 3 Encounters:  01/16/20 120/62  10/12/19 140/80  10/10/18 126/72   Patient has failed these meds in the past: benazepril (cough)  Patient checks BP at home does not check at home   Patient home BP readings are ranging: NA - does not check at home.   Patient is controlled on:   amlodipine 5 mg, 1 tablet every morning   HCTZ 12.5 mg, 1 capsule once daily   losartan 50 mg, 1 tablet once daily   We discussed diet and exercise extensively and the importance of checking blood pressure at home  . DASH diet:  following a diet emphasizing fruits and vegetables and low-fat dairy products along with whole grains, fish, poultry, and nuts. Reducing red meats and sugars.  . Exercising  . Blood pressure: patient and wife will plan to check BP at [facility] a few times a month . Diet: patient and his wife consume some canned vegetables but do not add extra salt to food; they do eat out a couple times a week (both fast food and take out)   Plan Continue current medications.  Will discuss with Dr. Sarajane Jews about switching to losartan-HCTZ combination for less pill burden.  Hyperlipidemia  LDL goal < 70  Lipid Panel     Component Value Date/Time   CHOL 111 10/12/2019 0933   TRIG 155.0 (H) 10/12/2019 0933   HDL 36.60 (L) 10/12/2019 0933   CHOLHDL 3 10/12/2019 0933   VLDL 31.0 10/12/2019 0933   LDLCALC 43 10/12/2019 0933    The ASCVD Risk score (Goff DC Jr., et al., 2013) failed to calculate for the following reasons:   The valid total cholesterol range is 130 to 320 mg/dL   Patient has failed these meds in past:  none    Patient is currently controlled on the following medications:   atorvastatin 20mg , 1 tablet every evening  We discussed:  diet and exercise extensively  . How to reduce cholesterol through diet/weight management and physical activity.    . We discussed how a diet high in plant sterols (fruits/vegetables/nuts/whole grains/legumes) may reduce your cholesterol.  Encouraged increasing fiber to a daily intake of 10-25g/day   Plan Continue current medications  Aspirin therapy  Patient denies history of MI/ stroke/ other cardiac event/ condition  Patient is currently on the following medications:   aspirin 81mg , 1 tablet once daily   We discussed:   Benefits/ risks of primary prevention of aspirin   Plan Per discussion with Dr. Sarajane Jews plan to stop taking aspirin for primary prevention.  Osteoarthritis   Patient has failed these meds  in past: none  Patient is currently controlled on the following medications:   diclofenac 75mg , 1 tablet twice daily as needed   Plan Continue current medications  OTC/ supplements   Patient is currentlyon the following medications:   Multivitamin, 1 tablet once daily  Omega-3 fatty acids (fish oil), 1 capsule once daily  Probiotic, 1 tablet once daily  Docusate 100mg , 1 tablet daily as needed  Vaccines   Reviewed and discussed patient's vaccination history.    Immunization History  Administered Date(s) Administered  . Fluad Quad(high Dose 65+) 07/04/2019, 07/01/2020  . Influenza Split 06/20/2012, 06/01/2013  . Influenza Whole 06/25/2008, 06/18/2009  . Influenza, High Dose Seasonal PF 07/10/2015, 06/05/2016, 05/24/2018  . Influenza,inj,quad, With Preservative 06/14/2017  . Influenza-Unspecified 06/11/2014, 06/14/2017  . Moderna SARS-COVID-2 Vaccination 09/25/2019, 10/23/2019  . Pneumococcal Conjugate-13 10/01/2014  . Pneumococcal Polysaccharide-23 07/30/2011  . Tdap 04/10/2016  . Zoster 09/11/2014  . Zoster Recombinat  (Shingrix) 10/19/2018, 12/22/2018   Patient received COVID Moderna booster on 05/03/20. Will send message to CMA to add to chart.  Plan  Patient is up to date on all immunizations.  Medication Management   Pt uses [facility] for all medications Uses pill box? Yes Pt endorses 100% compliance  We discussed: Current pharmacy is preferred with insurance plan and patient is satisfied with pharmacy services  Plan  Continue current medication management strategy   Follow up: 6 month phone visit with CPP 3 month BP assessment with CPA  Jeni Salles, PharmD Clinical Pharmacist [facility] [place]

## 2020-07-11 NOTE — Patient Instructions (Signed)
Visit Information  Goals Addressed            This Visit's Progress   . Pharmacy Care Plan       CARE PLAN ENTRY  Current Barriers:  . Chronic Disease Management support, education, and care coordination needs related to Hypertension, Hyperlipidemia, Diabetes, and Osteoarthritis  Hypertension (high blood pressure) . Current antihypertensive regimen:   Amlodipine 5mg , 1 tablet every morning   Hydrochlorothiazide 12.5mg , 1 capsule once daily   Losartan 50mg , 1 tablet once daily  . Previous antihypertensives tried: benazepril (stopped due to cough) . Last practice recorded BP readings:  BP Readings from Last 3 Encounters:  01/16/20 120/62  10/12/19 140/80  10/10/18 126/72 .  Current home BP readings: patient does not check at home   Hyperlipidemia (high cholesterol) . Current antihyperlipidemic regimen: atorvastatin 20mg , 1 tablet every evening . Previous antihyperlipidemic medications tried: none . Most recent lipid panel:     Component Value Date/Time   CHOL 111 10/12/2019 0933   TRIG 155.0 (H) 10/12/2019 0933   HDL 36.60 (L) 10/12/2019 0933   CHOLHDL 3 10/12/2019 0933   VLDL 31.0 10/12/2019 0933   LDLCALC 43 10/12/2019 0933   Diabetes Lab Results  Component Value Date   HGBA1C 5.4 01/16/2020 .  Current antihyperglycemic regimen:   metformin 500mg , 1 tablet twice daily with a meal    glipizide 5mg , 1 tablet twice daily before a meal  . Current meal patterns: patient stated decreasing soda intake as well as daily sugars  Pharmacist Clinical Goal(s):  Marland Kitchen Hypertension o Over the next 180 days, patient will maintain blood pressure; <140/90 . Hyperlipidemia o Over the next 180 days, patient will continue with diet and exercise modifications to lower ASCVD risk and maintain cholesterol levels at goal: . Cholesterol goals: Total Cholesterol goal under 200, Triglycerides goal under 150, HDL goal above 40 (men) or above 50 (women), LDL goal under 100.   . Diabetes o Over the next the 3 months patient will return for follow-up visit with Dr. Sarajane Jews for repeat A1c.  o A1c goal: <7.0%  Interventions: . Comprehensive medication review performed; medication list updated in electronic medical record.  . Hypertension . Discussed diet and exercise and affect on blood pressure.  Marland Kitchen DASH diet: following a diet emphasizing fruits and vegetables and low-fat dairy products along with whole grains, fish, poultry, and nuts. Reducing red meats and sugars.  . Hyperlipidemia . How to reduce cholesterol through diet/weight management and physical activity.    . We discussed how a diet high in plant sterols (fruits/vegetables/nuts/whole grains/legumes) may reduce your cholesterol.  Encouraged increasing fiber to a daily intake of 10-25g/day  . Diabetes o Discussed importance of preventative diabetic health exams.  Patient Self Care Activities:  . Patient verbalizes understanding of plan as described above, Self administers medications as prescribed, Calls pharmacy for medication refills, and Calls provider office for new concerns or questions . Continue current medications as directed by providers.  . Continue following up with primary care provider and/or specialists. . Continue routine of healthy habits (diet/ exercise).  Please see past updates related to this goal by clicking on the "Past Updates" button in the selected goal         The patient verbalized understanding of instructions provided today and declined a print copy of patient instruction materials.   Telephone follow up appointment with pharmacy team member scheduled for: 6 months

## 2020-07-15 ENCOUNTER — Telehealth: Payer: Self-pay | Admitting: Family Medicine

## 2020-07-15 MED ORDER — LOSARTAN POTASSIUM-HCTZ 50-12.5 MG PO TABS: 1.0000 | ORAL_TABLET | Freq: Every day | ORAL | 3 refills | Status: DC

## 2020-07-15 NOTE — Telephone Encounter (Signed)
Done

## 2020-07-15 NOTE — Telephone Encounter (Signed)
-----   Message from Viona Gilmore, [facility] sent at 07/11/2020 10:57 AM EDT ----- Regarding: BP medicine Good morning!  Would it be possible for you to send in a new prescription for his losartan/HCTZ to be combined into one tablet? He was on this previously but I think it was switched to separate agents with the big recall a while back.  Thanks, Maddie

## 2020-07-23 DIAGNOSIS — H938X3 Other specified disorders of ear, bilateral: Secondary | ICD-10-CM | POA: Diagnosis not present

## 2020-08-13 IMAGING — DX DG CHEST 2V
2 series · 2 of 2 positions shown · non-contrast
Comparison: 07/13/2014

CLINICAL DATA: Shortness of breath on exertion

EXAM:
CHEST - 2 VIEW

[chest pa]
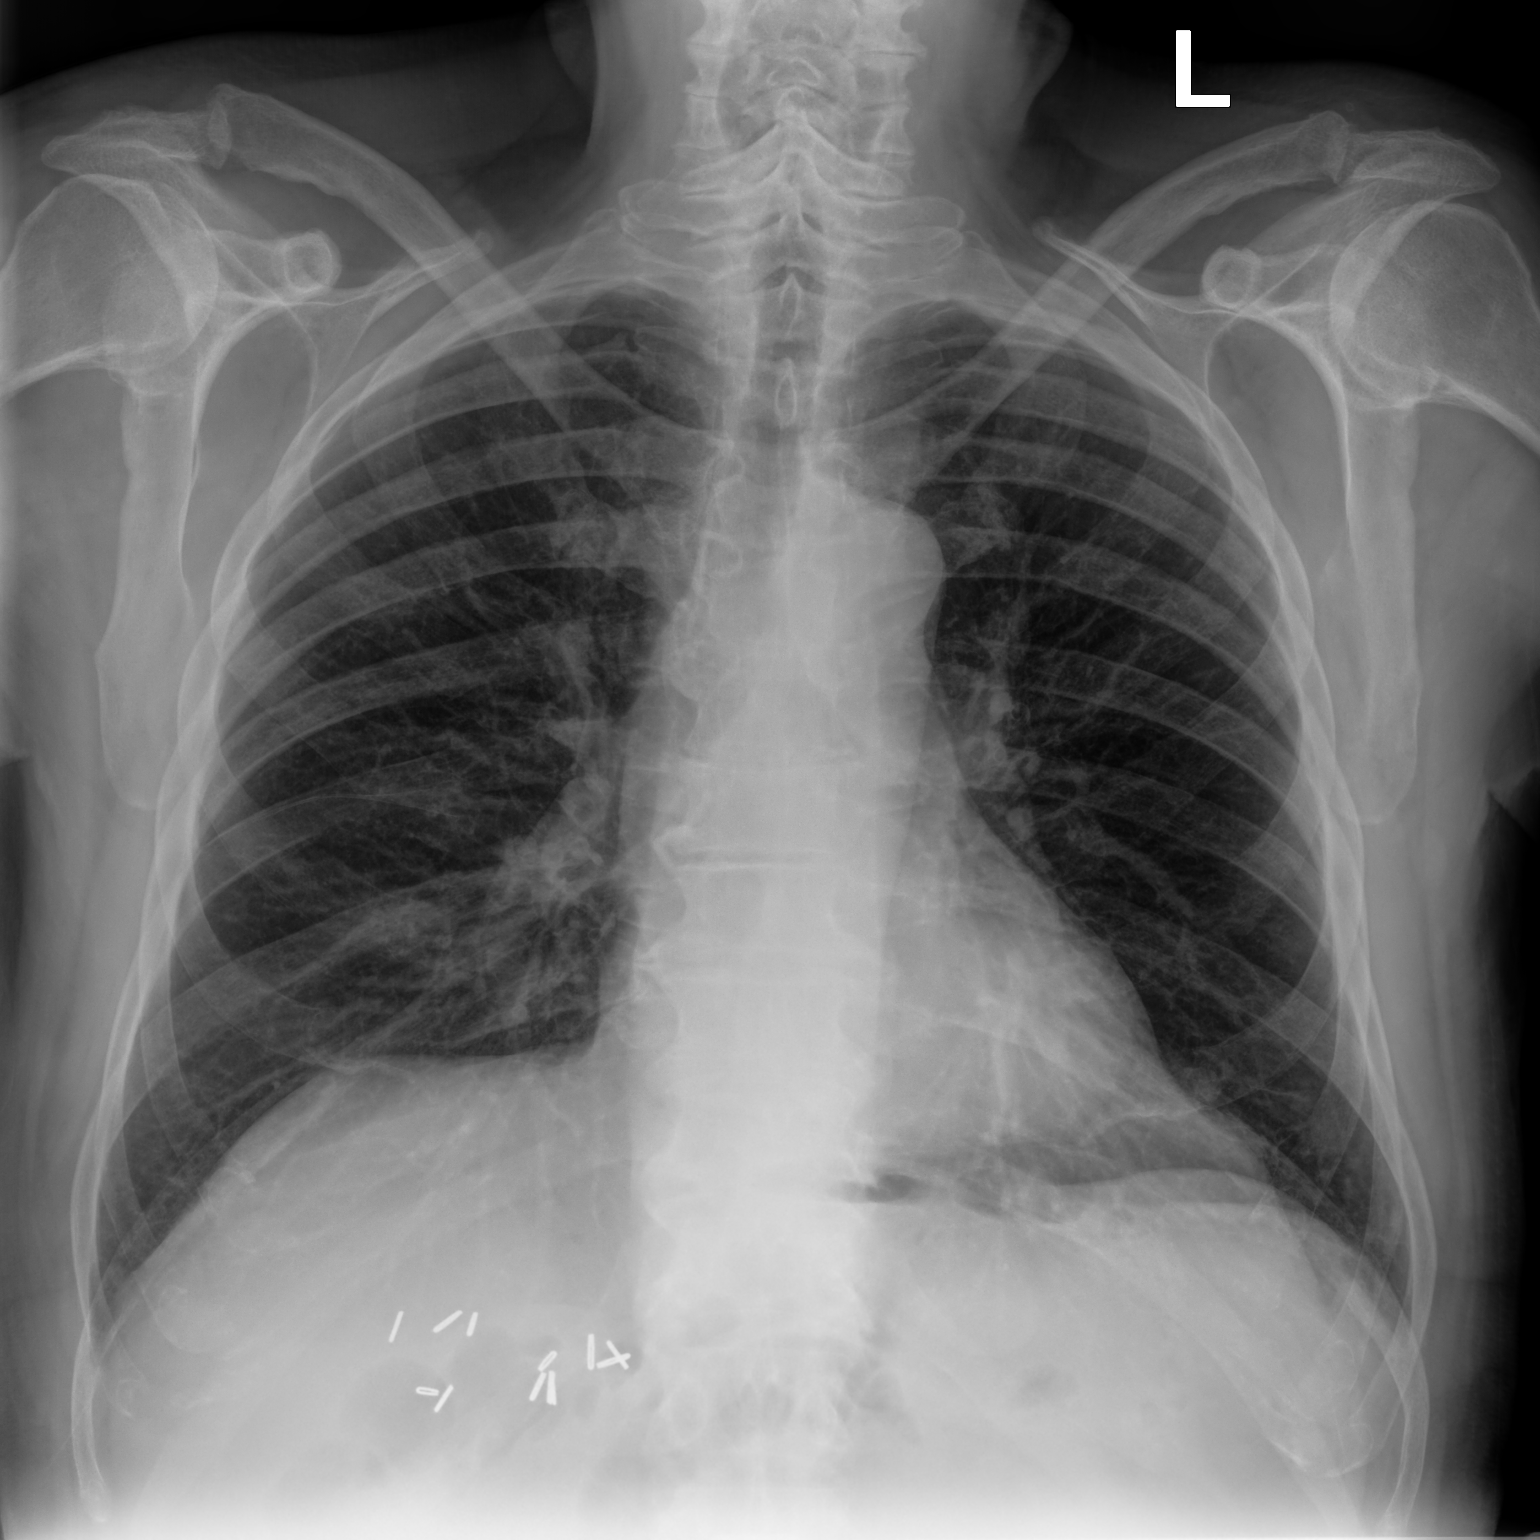

[chest lat]
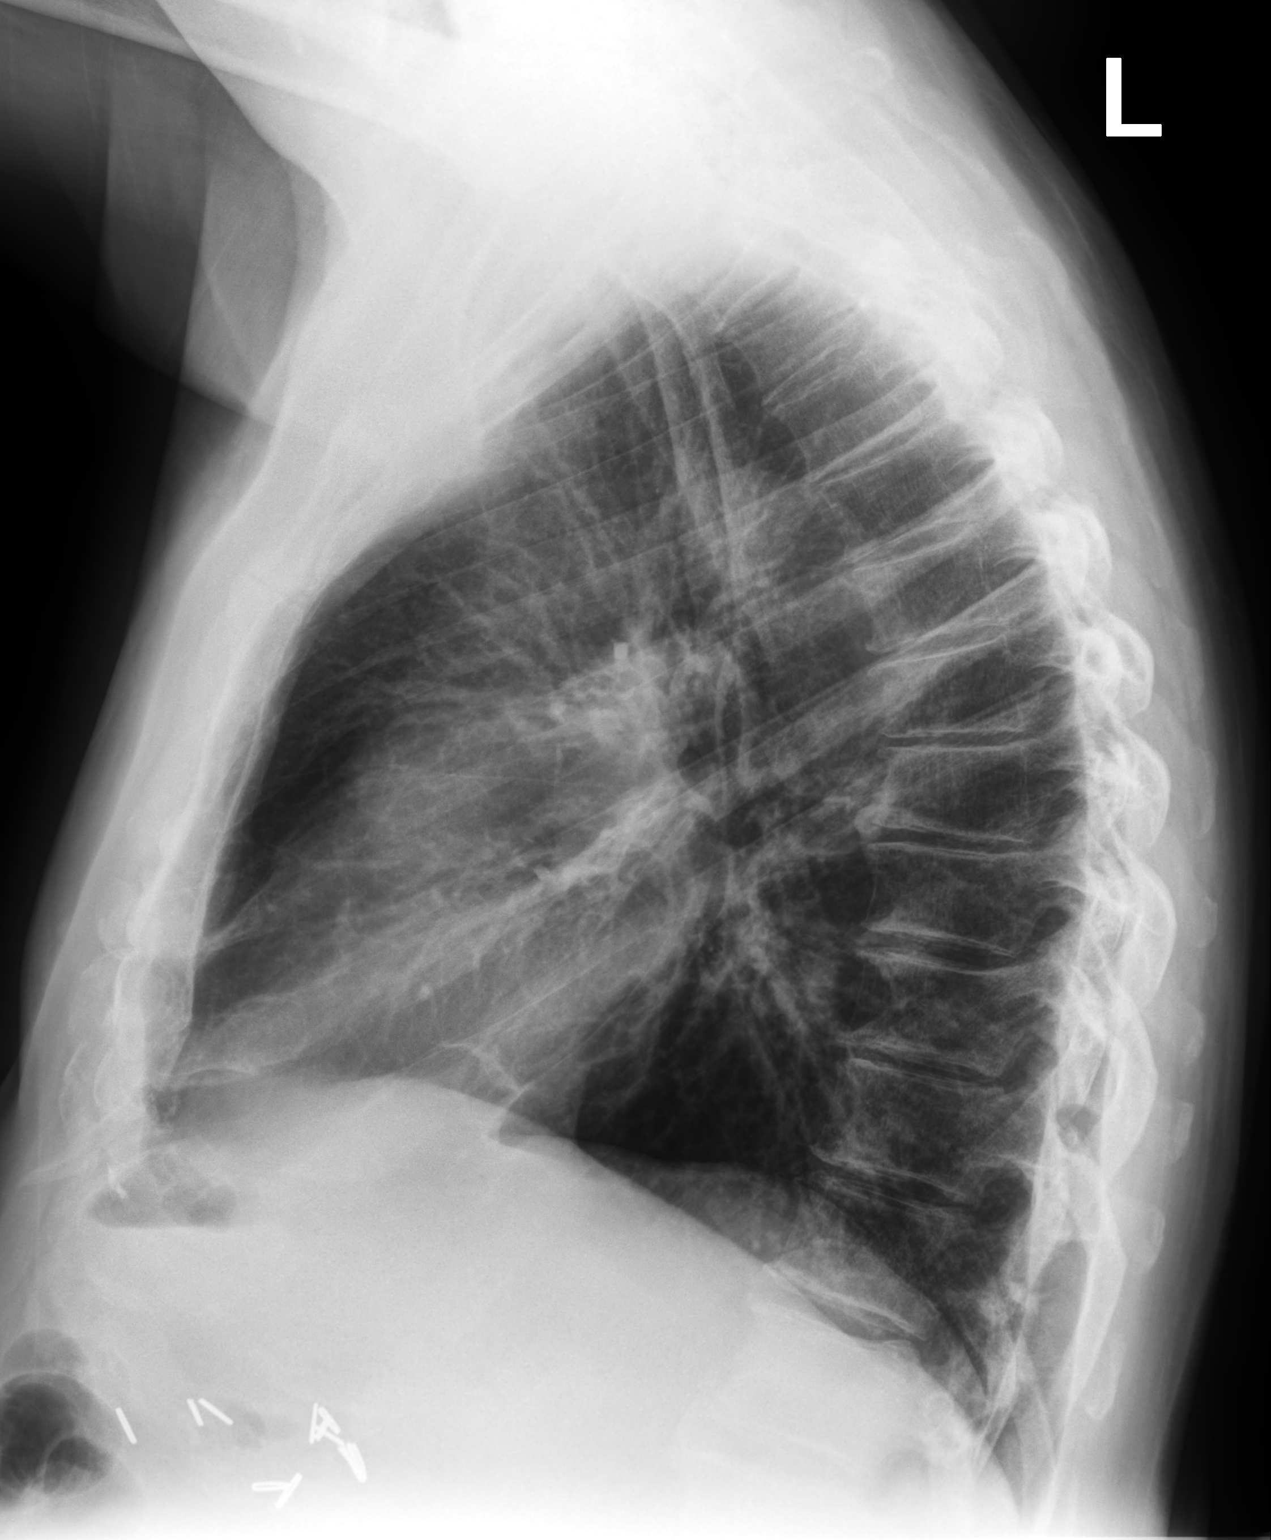

[2 of 2 positions shown; findings below may reference images not displayed]

FINDINGS: The heart size and mediastinal contours are within normal limits.
Both lungs are clear. The visualized skeletal structures are
unremarkable.
IMPRESSION: No active cardiopulmonary disease.

## 2020-08-25 ENCOUNTER — Other Ambulatory Visit: Payer: Self-pay | Admitting: Family Medicine

## 2020-08-30 DIAGNOSIS — E119 Type 2 diabetes mellitus without complications: Secondary | ICD-10-CM | POA: Diagnosis not present

## 2020-08-30 DIAGNOSIS — H2513 Age-related nuclear cataract, bilateral: Secondary | ICD-10-CM | POA: Diagnosis not present

## 2020-08-30 DIAGNOSIS — H25013 Cortical age-related cataract, bilateral: Secondary | ICD-10-CM | POA: Diagnosis not present

## 2020-08-30 DIAGNOSIS — H35033 Hypertensive retinopathy, bilateral: Secondary | ICD-10-CM | POA: Diagnosis not present

## 2020-08-30 LAB — HM DIABETES EYE EXAM

## 2020-09-17 ENCOUNTER — Telehealth: Payer: Self-pay | Admitting: Pharmacist

## 2020-09-17 NOTE — Chronic Care Management (AMB) (Signed)
Chronic Care Management Pharmacy Assistant   Name: Bobby Lopez  MRN: [id number] DOB: 1941/07/08  Reason for Encounter: Disease State/ Hypertension Adherence Call   PCP : Bobby Morale, MD  Allergies:   Allergies  Allergen Reactions  . Ace Inhibitors Cough    cough  . Hydrocodone Nausea And Vomiting    Medications: Outpatient Encounter Medications as of 09/17/2020  Medication Sig  . amLODipine (NORVASC) 5 MG tablet TAKE ONE TABLET BY MOUTH EVERY MORNING  . aspirin 81 MG tablet Take 81 mg by mouth daily.   Marland Kitchen atorvastatin (LIPITOR) 20 MG tablet TAKE ONE TABLET BY MOUTH EVERY EVENING  . diclofenac (VOLTAREN) 75 MG EC tablet Take 1 tablet (75 mg total) by mouth 2 (two) times daily. As needed for pain  . docusate sodium (COLACE) 100 MG capsule Take 1 capsule (100 mg total) by mouth 2 (two) times daily as needed (take to keep stool soft.). (Patient taking differently: Take 100 mg by mouth daily. )  . glipiZIDE (GLUCOTROL) 5 MG tablet TAKE ONE TABLET BY MOUTH TWICE A DAY BEFORE A MEAL  . hydrochlorothiazide (MICROZIDE) 12.5 MG capsule TAKE ONE CAPSULE BY MOUTH DAILY  . losartan (COZAAR) 50 MG tablet TAKE ONE TABLET BY MOUTH DAILY  . losartan-hydrochlorothiazide (HYZAAR) 50-12.5 MG tablet Take 1 tablet by mouth daily.  . metFORMIN (GLUCOPHAGE) 500 MG tablet TAKE ONE TABLET BY MOUTH TWICE A DAY WITH MEALS  . Multiple Vitamin (MULTIVITAMIN WITH MINERALS) TABS tablet Take 1 tablet by mouth daily. Centrum silver  . Omega-3 Fatty Acids (FISH OIL PO) Take by mouth daily.  . Probiotic Product (ALIGN PO) Take 1 tablet by mouth daily.    Facility-Administered Encounter Medications as of 09/17/2020  Medication  . 0.9 %  sodium chloride infusion    Current Diagnosis: Patient Active Problem List   Diagnosis Date Noted  . 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 DJD (degenerative joint disease) 05/04/2014  . Bladder stone 05/04/2014  . Hematuria 11/13/2013  . Postoperative ileus ([place])  10/22/2013  . Ventral hernia 10/19/2013  . VENTRAL HERNIA, INCISIONAL 01/01/2009  . Diabetes mellitus without complication ([place]) 123XX123  . Hyperlipidemia 06/14/2007  . Essential hypertension 05/09/2007    Goals Addressed   None    Reviewed chart prior to disease state call. Spoke with patient regarding BP  Recent Office Vitals: BP Readings from Last 3 Encounters:  01/16/20 120/62  10/12/19 140/80  10/10/18 126/72   Pulse Readings from Last 3 Encounters:  01/16/20 66  10/12/19 75  10/10/18 65    Wt Readings from Last 3 Encounters:  01/16/20 200 lb 3.2 oz (90.8 kg)  10/12/19 206 lb 12.8 oz (93.8 kg)  10/10/18 205 lb (93 kg)     Kidney Function Lab Results  Component Value Date/Time   CREATININE 1.19 10/12/2019 09:33 AM   CREATININE 1.11 10/10/2018 09:54 AM   CREATININE 1.0 12/08/2013 09:08 AM   GFR 58.99 (L) 10/12/2019 09:33 AM   GFRNONAA 73 (L) 07/21/2014 04:01 AM   GFRAA 84 (L) 07/21/2014 04:01 AM    BMP Latest Ref Rng & Units 10/12/2019 10/10/2018 10/06/2017  Glucose 70 - 99 mg/dL 187(H) 186(H) 175(H)  BUN 6 - 23 mg/dL 15 17 16   Creatinine 0.40 - 1.50 mg/dL 1.19 1.11 1.14  Sodium 135 - 145 mEq/L 139 140 142  Potassium 3.5 - 5.1 mEq/L 3.9 4.0 4.5  Chloride 96 - 112 mEq/L 102 103 103  CO2 19 - 32 mEq/L 28 28  26  Calcium 8.4 - 10.5 mg/dL 9.7 9.8 9.4    . Current antihypertensive regimen:  o Amlodipine (NORVASC) 5 MG tablet -Daily o Losartan (COZAAR) 50 mg tablet- daily o Losartan-hydrochlorothiazide 50.125 mg -Daily . How often are you checking your Blood Pressure? weekly . Current home BP readings: Taken at [facility] [employer] o 12/23 140/86 o 12/26  146/72 . What recent interventions/DTPs have been made by any provider to improve Blood Pressure control since last CPP Visit: None . Any recent hospitalizations or ED visits since last visit with CPP? No . What diet changes have been made to improve Blood Pressure Control?  o No Changes . What exercise is being  done to improve your Blood Pressure Control?  o No Changes  Adherence Review: Is the patient currently on ACE/ARB medication? Yes Does the patient have >5 day gap between last estimated fill dates? No  Follow-Up:  Pharmacist Review  I spoke with Bobby Lopez and discussed medication adherence with patient, no issues at this time with current medication. He states has been doing well. He has a follow-up with his PCP at the end of this month. He denies any side effects with her medications. Also, the patient denies any problems with his current pharmacy -[facility].  Berenice Bouton, [facility] Clinical Pharmacy Assistant [phone]

## 2020-09-26 ENCOUNTER — Encounter: Payer: Self-pay | Admitting: Family Medicine

## 2020-10-14 ENCOUNTER — Other Ambulatory Visit: Payer: Self-pay

## 2020-10-14 ENCOUNTER — Ambulatory Visit (INDEPENDENT_AMBULATORY_CARE_PROVIDER_SITE_OTHER): Payer: Medicare Other | Admitting: Family Medicine

## 2020-10-14 ENCOUNTER — Encounter: Payer: Self-pay | Admitting: Family Medicine

## 2020-10-14 VITALS — BP 119/79 | HR 65 | Temp 98.2°F | Resp 18 | Ht 72.0 in | Wt 206.6 lb

## 2020-10-14 DIAGNOSIS — M8949 Other hypertrophic osteoarthropathy, multiple sites: Secondary | ICD-10-CM

## 2020-10-14 DIAGNOSIS — N401 Enlarged prostate with lower urinary tract symptoms: Secondary | ICD-10-CM

## 2020-10-14 DIAGNOSIS — I10 Essential (primary) hypertension: Secondary | ICD-10-CM

## 2020-10-14 DIAGNOSIS — E119 Type 2 diabetes mellitus without complications: Secondary | ICD-10-CM | POA: Diagnosis not present

## 2020-10-14 DIAGNOSIS — M159 Polyosteoarthritis, unspecified: Secondary | ICD-10-CM

## 2020-10-14 DIAGNOSIS — N138 Other obstructive and reflux uropathy: Secondary | ICD-10-CM

## 2020-10-14 DIAGNOSIS — E782 Mixed hyperlipidemia: Secondary | ICD-10-CM

## 2020-10-14 LAB — LIPID PANEL
Cholesterol: 104 mg/dL (ref 0–200)
HDL: 33.5 mg/dL — ABNORMAL LOW (ref >39.00)
LDL Cholesterol: 51 mg/dL (ref 0–99)
NonHDL: 70.81
Total CHOL/HDL Ratio: 3
Triglycerides: 100 mg/dL (ref 0.0–149.0)
VLDL: 20 mg/dL (ref 0.0–40.0)

## 2020-10-14 LAB — HEPATIC FUNCTION PANEL
ALT: 24 U/L (ref 0–53)
AST: 20 U/L (ref 0–37)
Albumin: 4.2 g/dL (ref 3.5–5.2)
Alkaline Phosphatase: 41 U/L (ref 39–117)
Bilirubin, Direct: 0.2 mg/dL (ref 0.0–0.3)
Total Bilirubin: 0.8 mg/dL (ref 0.2–1.2)
Total Protein: 6.9 g/dL (ref 6.0–8.3)

## 2020-10-14 LAB — BASIC METABOLIC PANEL
BUN: 26 mg/dL — ABNORMAL HIGH (ref 6–23)
CO2: 28 mEq/L (ref 19–32)
Calcium: 9.4 mg/dL (ref 8.4–10.5)
Chloride: 109 mEq/L (ref 96–112)
Creatinine, Ser: 1.19 mg/dL (ref 0.40–1.50)
GFR: 57.93 mL/min — ABNORMAL LOW (ref >60.00)
Glucose, Bld: 133 mg/dL — ABNORMAL HIGH (ref 70–99)
Potassium: 4.8 mEq/L (ref 3.5–5.1)
Sodium: 142 mEq/L (ref 135–145)

## 2020-10-14 LAB — CBC WITH DIFFERENTIAL/PLATELET
Basophils Absolute: 0 10*3/uL (ref 0.0–0.1)
Basophils Relative: 0.7 % (ref 0.0–3.0)
Eosinophils Absolute: 0.1 10*3/uL (ref 0.0–0.7)
Eosinophils Relative: 1.3 % (ref 0.0–5.0)
HCT: 40.9 % (ref 39.0–52.0)
Hemoglobin: 14 g/dL (ref 13.0–17.0)
Lymphocytes Relative: 31.3 % (ref 12.0–46.0)
Lymphs Abs: 2.2 10*3/uL (ref 0.7–4.0)
MCHC: 34.3 g/dL (ref 30.0–36.0)
MCV: 89.5 fl (ref 78.0–100.0)
Monocytes Absolute: 0.6 10*3/uL (ref 0.1–1.0)
Monocytes Relative: 8.7 % (ref 3.0–12.0)
Neutro Abs: 4.1 10*3/uL (ref 1.4–7.7)
Neutrophils Relative %: 58 % (ref 43.0–77.0)
Platelets: 155 10*3/uL (ref 150.0–400.0)
RBC: 4.56 Mil/uL (ref 4.22–5.81)
RDW: 12.9 % (ref 11.5–15.5)
WBC: 7 10*3/uL (ref 4.0–10.5)

## 2020-10-14 LAB — PSA: PSA: 1.58 ng/mL (ref 0.10–4.00)

## 2020-10-14 LAB — HEMOGLOBIN A1C: Hgb A1c MFr Bld: 5.5 % (ref 4.6–6.5)

## 2020-10-14 LAB — TSH: TSH: 3.72 u[IU]/mL (ref 0.35–4.50)

## 2020-10-14 NOTE — Addendum Note (Signed)
Addended by: Marrion Coy on: 10/14/2020 08:35 AM   Modules accepted: Orders

## 2020-10-14 NOTE — Progress Notes (Signed)
Subjective:    Patient ID: Bobby Lopez, male    DOB: 10-07-1940, 80 y.o.   MRN: [id number]  HPI Here to follow up on issues. He feels well. His BP has been well controlled. His arthritis is stable. He watches his diet. He normally walks 5-6 miles a day, but he has had to cut back with the recent weather.    Review of Systems  Constitutional: Negative.   HENT: Negative.   Eyes: Negative.   Respiratory: Negative.   Cardiovascular: Negative.   Gastrointestinal: Negative.   Genitourinary: Negative.   Musculoskeletal: Positive for arthralgias.  Skin: Negative.   Neurological: Negative.   Psychiatric/Behavioral: Negative.        Objective:   Physical Exam Constitutional:      General: He is not in acute distress.    Appearance: He is well-developed and well-nourished. He is not diaphoretic.  HENT:     Head: Normocephalic and atraumatic.     Right Ear: External ear normal.     Left Ear: External ear normal.     Nose: Nose normal.     Mouth/Throat:     Mouth: Oropharynx is clear and moist.     Pharynx: No oropharyngeal exudate.  Eyes:     General: No scleral icterus.       Right eye: No discharge.        Left eye: No discharge.     Extraocular Movements: EOM normal.     Conjunctiva/sclera: Conjunctivae normal.     Pupils: Pupils are equal, round, and reactive to light.  Neck:     Thyroid: No thyromegaly.     Vascular: No JVD.     Trachea: No tracheal deviation.  Cardiovascular:     Rate and Rhythm: Normal rate and regular rhythm.     Pulses: Intact distal pulses.     Heart sounds: Normal heart sounds. No murmur heard. No friction rub. No gallop.   Pulmonary:     Effort: Pulmonary effort is normal. No respiratory distress.     Breath sounds: Normal breath sounds. No wheezing or rales.  Chest:     Chest wall: No tenderness.  Abdominal:     General: Bowel sounds are normal. There is no distension.     Palpations: Abdomen is soft. There is no mass.     Tenderness:  There is no abdominal tenderness. There is no guarding or rebound.  Genitourinary:    Penis: Normal. No tenderness.      Testes: Normal.     Prostate: Normal.     Rectum: Normal. Guaiac result negative.  Musculoskeletal:        General: No tenderness or edema. Normal range of motion.     Cervical back: Neck supple.  Lymphadenopathy:     Cervical: No cervical adenopathy.  Skin:    General: Skin is warm and dry.     Coloration: Skin is not pale.     Findings: No erythema or rash.  Neurological:     Mental Status: He is alert and oriented to person, place, and time.     Cranial Nerves: No cranial nerve deficit.     Motor: No abnormal muscle tone.     Coordination: Coordination normal.     Deep Tendon Reflexes: Reflexes are normal and symmetric. Reflexes normal.  Psychiatric:        Mood and Affect: Mood and affect normal.        Behavior: Behavior normal.  Thought Content: Thought content normal.        Judgment: Judgment normal.           Assessment & Plan:  His OA and HTN are stable. Get fasting labs to check an A1c, lipids, etc.  Alysia Penna, MD

## 2020-11-26 ENCOUNTER — Other Ambulatory Visit: Payer: Self-pay | Admitting: Family Medicine

## 2020-12-30 ENCOUNTER — Telehealth: Payer: Self-pay | Admitting: Pharmacist

## 2020-12-30 NOTE — Chronic Care Management (AMB) (Signed)
I spoke with the patient about his upcoming appointment on 04.19.2022 @ 9:00 with the clinical pharmacist. He stated that he had recently been seen by the office. He declined his appointment, and it was cancelled.  Maia Breslow, [place] [phone]

## 2020-12-31 ENCOUNTER — Telehealth: Payer: Medicare PPO

## 2021-02-24 ENCOUNTER — Other Ambulatory Visit: Payer: Self-pay | Admitting: Family Medicine

## 2021-03-19 ENCOUNTER — Other Ambulatory Visit: Payer: Self-pay

## 2021-03-19 ENCOUNTER — Ambulatory Visit (INDEPENDENT_AMBULATORY_CARE_PROVIDER_SITE_OTHER): Payer: Medicare Other

## 2021-03-19 DIAGNOSIS — Z0000 Encounter for general adult medical examination without abnormal findings: Secondary | ICD-10-CM

## 2021-03-19 NOTE — Patient Instructions (Addendum)
[name] , Thank you for taking time to come for your Medicare Wellness Visit. I appreciate your ongoing commitment to your health goals. Please review the following plan we discussed and let me know if I can assist you in the future.   Screening recommendations/referrals: Colonoscopy: No longer required  Recommended yearly ophthalmology/optometry visit for glaucoma screening and checkup Recommended yearly dental visit for hygiene and checkup  Vaccinations: Influenza vaccine: Due 04/14/21 Pneumococcal vaccine: Completed  Tdap vaccine: Done 04/10/16 repeat in 10 years 04/10/26 Shingles vaccine: Completed 2/5, 12/22/18   Covid-19: Completed 1/11, 2/8, 05/03/20 & 12/23/20  Advanced directives: Please bring a copy of your health care power of attorney and living will to the office at your convenience.  Conditions/risks identified: None at this time  Next appointment: Follow up in one year for your annual wellness visit.   Preventive Care 65 Years and Older, Male Preventive care refers to lifestyle choices and visits with your health care provider that can promote health and wellness. What does preventive care include? A yearly physical exam. This is also called an annual well check. Dental exams once or twice a year. Routine eye exams. Ask your health care provider how often you should have your eyes checked. Personal lifestyle choices, including: Daily care of your teeth and gums. Regular physical activity. Eating a healthy diet. Avoiding tobacco and drug use. Limiting alcohol use. Practicing safe sex. Taking low doses of aspirin every day. Taking vitamin and mineral supplements as recommended by your health care provider. What happens during an annual well check? The services and screenings done by your health care provider during your annual well check will depend on your age, overall health, lifestyle risk factors, and family history of disease. Counseling  Your health care provider  may ask you questions about your: Alcohol use. Tobacco use. Drug use. Emotional well-being. Home and relationship well-being. Sexual activity. Eating habits. History of falls. Memory and ability to understand (cognition). Work and work Statistician. Screening  You may have the following tests or measurements: Height, weight, and BMI. Blood pressure. Lipid and cholesterol levels. These may be checked every 5 years, or more frequently if you are over 70 years old. Skin check. Lung cancer screening. You may have this screening every year starting at age 58 if you have a 30-pack-year history of smoking and currently smoke or have quit within the past 15 years. Fecal occult blood test (FOBT) of the stool. You may have this test every year starting at age 73. Flexible sigmoidoscopy or colonoscopy. You may have a sigmoidoscopy every 5 years or a colonoscopy every 10 years starting at age 69. Prostate cancer screening. Recommendations will vary depending on your family history and other risks. Hepatitis C blood test. Hepatitis B blood test. Sexually transmitted disease (STD) testing. Diabetes screening. This is done by checking your blood sugar (glucose) after you have not eaten for a while (fasting). You may have this done every 1-3 years. Abdominal aortic aneurysm (AAA) screening. You may need this if you are a current or former smoker. Osteoporosis. You may be screened starting at age 106 if you are at high risk. Talk with your health care provider about your test results, treatment options, and if necessary, the need for more tests. Vaccines  Your health care provider may recommend certain vaccines, such as: Influenza vaccine. This is recommended every year. Tetanus, diphtheria, and acellular pertussis (Tdap, Td) vaccine. You may need a Td booster every 10 years. Zoster vaccine. You may  need this after age 38. Pneumococcal 13-valent conjugate (PCV13) vaccine. One dose is recommended after  age 90. Pneumococcal polysaccharide (PPSV23) vaccine. One dose is recommended after age 70. Talk to your health care provider about which screenings and vaccines you need and how often you need them. This information is not intended to replace advice given to you by your health care provider. Make sure you discuss any questions you have with your health care provider. Document Released: 09/27/2015 Document Revised: 05/20/2016 Document Reviewed: 07/02/2015 Elsevier Interactive Patient Education  2017 [place] Prevention in the Home Falls can cause injuries. They can happen to people of all ages. There are many things you can do to make your home safe and to help prevent falls. What can I do on the outside of my home? Regularly fix the edges of walkways and driveways and fix any cracks. Remove anything that might make you trip as you walk through a door, such as a raised step or threshold. Trim any bushes or trees on the path to your home. Use bright outdoor lighting. Clear any walking paths of anything that might make someone trip, such as rocks or tools. Regularly check to see if handrails are loose or broken. Make sure that both sides of any steps have handrails. Any raised decks and porches should have guardrails on the edges. Have any leaves, snow, or ice cleared regularly. Use sand or salt on walking paths during winter. Clean up any spills in your garage right away. This includes oil or grease spills. What can I do in the bathroom? Use night lights. Install grab bars by the toilet and in the tub and shower. Do not use towel bars as grab bars. Use non-skid mats or decals in the tub or shower. If you need to sit down in the shower, use a plastic, non-slip stool. Keep the floor dry. Clean up any water that spills on the floor as soon as it happens. Remove soap buildup in the tub or shower regularly. Attach bath mats securely with double-sided non-slip rug tape. Do not have  throw rugs and other things on the floor that can make you trip. What can I do in the bedroom? Use night lights. Make sure that you have a light by your bed that is easy to reach. Do not use any sheets or blankets that are too big for your bed. They should not hang down onto the floor. Have a firm chair that has side arms. You can use this for support while you get dressed. Do not have throw rugs and other things on the floor that can make you trip. What can I do in the kitchen? Clean up any spills right away. Avoid walking on wet floors. Keep items that you use a lot in easy-to-reach places. If you need to reach something above you, use a strong step stool that has a grab bar. Keep electrical cords out of the way. Do not use floor polish or wax that makes floors slippery. If you must use wax, use non-skid floor wax. Do not have throw rugs and other things on the floor that can make you trip. What can I do with my stairs? Do not leave any items on the stairs. Make sure that there are handrails on both sides of the stairs and use them. Fix handrails that are broken or loose. Make sure that handrails are as long as the stairways. Check any carpeting to make sure that it is firmly attached  to the stairs. Fix any carpet that is loose or worn. Avoid having throw rugs at the top or bottom of the stairs. If you do have throw rugs, attach them to the floor with carpet tape. Make sure that you have a light switch at the top of the stairs and the bottom of the stairs. If you do not have them, ask someone to add them for you. What else can I do to help prevent falls? Wear shoes that: Do not have high heels. Have rubber bottoms. Are comfortable and fit you well. Are closed at the toe. Do not wear sandals. If you use a stepladder: Make sure that it is fully opened. Do not climb a closed stepladder. Make sure that both sides of the stepladder are locked into place. Ask someone to hold it for you, if  possible. Clearly mark and make sure that you can see: Any grab bars or handrails. First and last steps. Where the edge of each step is. Use tools that help you move around (mobility aids) if they are needed. These include: Canes. Walkers. Scooters. Crutches. Turn on the lights when you go into a dark area. Replace any light bulbs as soon as they burn out. Set up your furniture so you have a clear path. Avoid moving your furniture around. If any of your floors are uneven, fix them. If there are any pets around you, be aware of where they are. Review your medicines with your doctor. Some medicines can make you feel dizzy. This can increase your chance of falling. Ask your doctor what other things that you can do to help prevent falls. This information is not intended to replace advice given to you by your health care provider. Make sure you discuss any questions you have with your health care provider. Document Released: 06/27/2009 Document Revised: 02/06/2016 Document Reviewed: 10/05/2014 Elsevier Interactive Patient Education  2017 [employer].

## 2021-03-19 NOTE — Progress Notes (Addendum)
Virtual Visit via Telephone Note  I connected with  GARRETH BURNSWORTH on 03/19/21 at 11:00 AM EDT by telephone and verified that I am speaking with the correct person using two identifiers.  Location: Patient: Home Provider: Office Persons participating in the virtual visit: patient/Nurse Health Advisor   I discussed the limitations, risks, security and privacy concerns of performing an evaluation and management service by telephone and the availability of in person appointments. The patient expressed understanding and agreed to proceed.  Interactive audio and video telecommunications were attempted between this nurse and patient, however failed, due to patient having technical difficulties OR patient did not have access to video capability.  We continued and completed visit with audio only.  Some vital signs may be absent or patient reported.   Willette Brace, LPN   Subjective:   AUSENCIO VADEN is a 80 y.o. male who presents for Medicare Annual/Subsequent preventive examination.  Review of Systems           Objective:    There were no vitals filed for this visit. There is no height or weight on file to calculate BMI.  Advanced Directives 10/10/2018 10/06/2017 03/10/2017 07/20/2014 07/13/2014 10/23/2013 10/19/2013  Does Patient Have a Medical Advance Directive? Yes Yes Yes Yes Yes Patient has advance directive, copy in chart Patient has advance directive, copy in chart  Type of Advance Directive Living will;Healthcare Power of [place];Living will Living will Living will - -  Does patient want to make changes to medical advance directive? No - Patient declined - - - - No No  Copy of Healthcare Power of Attorney in Chart? Yes - validated most recent copy scanned in chart (See row information) - - Yes Yes - -  Pre-existing out of facility DNR order (yellow form or pink MOST form) - - - - - - -    Current Medications (verified) Outpatient Encounter  Medications as of 03/19/2021  Medication Sig   amLODipine (NORVASC) 5 MG tablet TAKE ONE TABLET BY MOUTH EVERY MORNING   atorvastatin (LIPITOR) 20 MG tablet TAKE ONE TABLET BY MOUTH EVERY EVENING   diclofenac (VOLTAREN) 75 MG EC tablet TAKE ONE TABLET BY MOUTH TWICE A DAY AS NEEDED FOR PAIN   docusate sodium (COLACE) 100 MG capsule Take 1 capsule (100 mg total) by mouth 2 (two) times daily as needed (take to keep stool soft.). (Patient taking differently: Take 100 mg by mouth daily.)   glipiZIDE (GLUCOTROL) 5 MG tablet TAKE ONE TABLET BY MOUTH TWICE A DAY BEFORE A MEAL   loratadine (CLARITIN) 10 MG tablet Take 10 mg by mouth daily.   losartan-hydrochlorothiazide (HYZAAR) 50-12.5 MG tablet Take 1 tablet by mouth daily.   metFORMIN (GLUCOPHAGE) 500 MG tablet TAKE ONE TABLET BY MOUTH TWICE A DAY WITH MEALS   Multiple Vitamin (MULTIVITAMIN WITH MINERALS) TABS tablet Take 1 tablet by mouth daily. Centrum silver   Omega-3 Fatty Acids (FISH OIL PO) Take by mouth daily.   Probiotic Product (ALIGN PO) Take 1 tablet by mouth daily.    [DISCONTINUED] aspirin 81 MG tablet Take 81 mg by mouth daily.  (Patient not taking: No sig reported)   [DISCONTINUED] hydrochlorothiazide (MICROZIDE) 12.5 MG capsule TAKE ONE CAPSULE BY MOUTH DAILY (Patient not taking: Reported on 03/19/2021)   [DISCONTINUED] losartan (COZAAR) 50 MG tablet TAKE ONE TABLET BY MOUTH DAILY (Patient not taking: Reported on 03/19/2021)   Facility-Administered Encounter Medications as of 03/19/2021  Medication   0.9 %  sodium chloride infusion    Allergies (verified) Ace inhibitors and Hydrocodone   History: Past Medical History:  Diagnosis Date   Arthritis    Back   Bladder stones    NEEDS TO HAVE SURGERY   BPH (benign prostatic hyperplasia)    Diabetes mellitus    type  II   DVT (deep venous thrombosis) (HCC)    Eczema    Hyperlipidemia    Hypertension    Lumbar herniated disc    Past Surgical History:  Procedure Laterality Date    CHOLECYSTECTOMY     COLONOSCOPY  03/24/2017   per Dr. Silverio Decamp, adenomatous polyps, no repeats due to age    70 W/ RETROGRADES Bilateral 07/20/2014   Procedure: Nelva Nay;  Surgeon: Ardis Hughs, MD;  Location: [facility];  Service: Urology;  Laterality: Bilateral;   HOLMIUM LASER APPLICATION N/A 41/05/3789   Procedure: HOLMIUM LASER APPLICATION;  Surgeon: Ardis Hughs, MD;  Location: [facility];  Service: Urology;  Laterality: N/A;   INSERTION OF MESH N/A 10/19/2013   Procedure: INSERTION OF MESH;  Surgeon: Merrie Roof, MD;  Location: [facility];  Service: General;  Laterality: N/A;   LUMBAR LAMINECTOMY/DECOMPRESSION MICRODISCECTOMY  09/26/2012   Procedure: LUMBAR LAMINECTOMY/DECOMPRESSION MICRODISCECTOMY 1 LEVEL;  Surgeon: Elaina Hoops, MD;  Location: [facility];  Service: Neurosurgery;  Laterality: Left;  Lumbar Lamiectomy Extraforaminal Diskectomy Lumbar Three-Four Left   TRANSURETHRAL RESECTION OF PROSTATE N/A 07/20/2014   Procedure: TRANSURETHRAL RESECTION OF THE PROSTATE (TURP) WITH GYRUS;  Surgeon: Ardis Hughs, MD;  Location: [facility];  Service: Urology;  Laterality: N/A;   VENTRAL HERNIA REPAIR  10/19/2013   DR TOTH   VENTRAL HERNIA REPAIR N/A 10/19/2013   Procedure: LAPAROSCOPIC VENTRAL HERNIA;  Surgeon: Merrie Roof, MD;  Location: [facility];  Service: General;  Laterality: N/A;   Family History  Problem Relation Age of Onset   Diabetes Mother    Hypertension Mother    Esophageal cancer Sister    Colon cancer Neg Hx    Rectal cancer Neg Hx    Stomach cancer Neg Hx    Social History   Socioeconomic History   Marital status: Married    Spouse name: Not on file   Number of children: Not on file   Years of education: Not on file   Highest education level: Not on file  Occupational History    Comment: retired Building control surveyor  Tobacco Use   Smoking status: Former    Pack years: 0.00    Types: Cigarettes    Quit date: 07/13/1973    Years since quitting: 47.7    Smokeless tobacco: Never   Tobacco comments:    quit > 46 years ago  Vaping Use   Vaping Use: Not on file  Substance and Sexual Activity   Alcohol use: No    Alcohol/week: 0.0 standard drinks   Drug use: No   Sexual activity: Not Currently  Other Topics Concern   Not on file  Social History Narrative   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   Lives with wife and dog who he has had for many years   Walks dog for exercise, but walking ability limited due to back and hip pain   Social Determinants of Health   Financial Resource Strain: Low Risk    Difficulty of Paying Living Expenses: Not hard at all  Food Insecurity: No Food Insecurity   Worried About Charity fundraiser in the Last Year: Never  true   Ran Out of Food in the Last Year: Never true  Transportation Needs: No Transportation Needs   Lack of Transportation (Medical): No   Lack of Transportation (Non-Medical): No  Physical Activity: Sufficiently Active   Days of Exercise per Week: 3 days   Minutes of Exercise per Session: 60 min  Stress: No Stress Concern Present   Feeling of Stress : Not at all  Social Connections: Moderately Integrated   Frequency of Communication with Friends and Family: Twice a week   Frequency of Social Gatherings with Friends and Family: Twice a week   Attends Religious Services: 1 to 4 times per year   Active Member of [employer] or Organizations: No   Attends Music therapist: Never   Marital Status: Married    Tobacco Counseling Counseling given: Not Answered Tobacco comments: quit > 46 years ago   Clinical Intake:  Pre-visit preparation completed: Yes  Pain : No/denies pain     BMI - recorded: 28.02 Nutritional Status: BMI 25 -29 Overweight Nutritional Risks: None Diabetes: Yes CBG done?: No Did pt. bring in CBG monitor from home?: No  How often do you need to have someone help you when you read instructions, pamphlets, or other written materials from your doctor or  pharmacy?: 1 - Never  Diabetic?Nutrition Risk Assessment:  Has the patient had any N/V/D within the last 2 months?  No  Does the patient have any non-healing wounds?  No  Has the patient had any unintentional weight loss or weight gain?  No   Diabetes:  Is the patient diabetic?  Yes  If diabetic, was a CBG obtained today?  No  Did the patient bring in their glucometer from home?  No  How often do you monitor your CBG's? N/A  Are you having any financial strains with the device, your supplies or your medication? No .  Does the patient want to be seen by Chronic Care Management for management of their diabetes?  No  Would the patient like to be referred to a Nutritionist or for Diabetic Management?  No   Diabetic Exams:  Diabetic Eye Exam: Completed 08/30/20 Diabetic Foot Exam: Overdue, Pt has been advised about the importance in completing this exam. Pt is scheduled for diabetic foot exam on postponed until 10/14/21.   Interpreter Needed?: No  Information entered by :: Charlott Rakes, LPN   Activities of Daily Living No flowsheet data found.  Patient Care Team: Laurey Morale, MD as PCP - General Louis Meckel Viona Gilmore, MD as Attending Physician (Urology) Heath Lark, MD as Consulting Physician (Hematology and Oncology) Kary Kos, MD as Consulting Physician (Neurosurgery) Viona Gilmore, [facility] as Pharmacist (Pharmacist)  Indicate any recent Medical Services you may have received from other than [facility] providers in the past year (date may be approximate).     Assessment:   This is a routine wellness examination for Roxanne.  Hearing/Vision screen No results found.  Dietary issues and exercise activities discussed:     Goals Addressed             This Visit's Progress    Patient Stated       None at this time         Depression Screen PHQ 2/9 Scores 03/19/2021 10/10/2018 10/06/2017 10/06/2017 09/13/2015 09/11/2014  PHQ - 2 Score 0 2 0 0 0 0  PHQ- 9 Score - 8 - -  - -    Fall Risk Fall Risk  10/10/2018 10/06/2017  10/06/2017 09/13/2015 09/11/2014  Falls in the past year? 1 No No No No  Number falls in past yr: 0 - - - -  Injury with Fall? 1 - - - -  Risk for fall due to : History of fall(s);Impaired vision - - - -  Follow up Falls evaluation completed - - - -    FALL RISK PREVENTION PERTAINING TO THE HOME:  Any stairs in or around the home? Yes  If so, are there any without handrails? No  Home free of loose throw rugs in walkways, pet beds, electrical cords, etc? Yes  Adequate lighting in your home to reduce risk of falls? Yes   ASSISTIVE DEVICES UTILIZED TO PREVENT FALLS:  Life alert? No  Use of a cane, walker or w/c? No  Grab bars in the bathroom? No  Shower chair or bench in shower? No  Elevated toilet seat or a handicapped toilet? No   TIMED UP AND GO:  Was the test performed? No      Cognitive Function: MMSE - Mini Mental State Exam 10/06/2017  Not completed: (No Data)        Immunizations Immunization History  Administered Date(s) Administered   Fluad Quad(high Dose 65+) 07/04/2019, 07/01/2020   Influenza Split 06/20/2012, 06/01/2013   Influenza Whole 06/25/2008, 06/18/2009   Influenza, High Dose Seasonal PF 07/10/2015, 06/05/2016, 05/24/2018   Influenza,inj,quad, With Preservative 06/14/2017   Influenza-Unspecified 06/11/2014, 06/14/2017   Moderna Sars-Covid-2 Vaccination 09/25/2019, 10/23/2019, 05/03/2020, 12/23/2020   Pneumococcal Conjugate-13 10/01/2014   Pneumococcal Polysaccharide-23 07/30/2011   Tdap 04/10/2016   Zoster Recombinat (Shingrix) 10/19/2018, 12/22/2018   Zoster, Live 09/11/2014    TDAP status: Up to date  Flu Vaccine status: Up to date  Pneumococcal vaccine status: Up to date  Covid-19 vaccine status: Completed vaccines  Qualifies for Shingles Vaccine? Yes   Zostavax completed Yes   Shingrix Completed?: Yes  Screening Tests Health Maintenance  Topic Date Due   FOOT EXAM  10/14/2021  (Originally 10/11/2019)   HEMOGLOBIN A1C  04/13/2021   INFLUENZA VACCINE  04/14/2021   OPHTHALMOLOGY EXAM  08/30/2021   TETANUS/TDAP  04/10/2026   COVID-19 Vaccine  Completed   PNA vac Low Risk Adult  Completed   Zoster Vaccines- Shingrix  Completed   HPV VACCINES  Aged Out    Health Maintenance  There are no preventive care reminders to display for this patient.   Colorectal cancer screening: No longer required.    Additional Screening:   Vision Screening: Recommended annual ophthalmology exams for early detection of glaucoma and other disorders of the eye. Is the patient up to date with their annual eye exam?  Yes  Who is the provider or what is the name of the office in which the patient attends annual eye exams? Dr Herbert Deaner  If pt is not established with a provider, would they like to be referred to a provider to establish care? No .   Dental Screening: Recommended annual dental exams for proper oral hygiene  Community Resource Referral / Chronic Care Management: CRR required this visit?  No   CCM required this visit?  No      Plan:     I have personally reviewed and noted the following in the patient's chart:   Medical and social history Use of alcohol, tobacco or illicit drugs  Current medications and supplements including opioid prescriptions. Patient is not currently taking opioid prescriptions. Functional ability and status Nutritional status Physical activity Advanced directives List of other physicians  Hospitalizations, surgeries, and ER visits in previous 12 months Vitals Screenings to include cognitive, depression, and falls Referrals and appointments  In addition, I have reviewed and discussed with patient certain preventive protocols, quality metrics, and best practice recommendations. A written personalized care plan for preventive services as well as general preventive health recommendations were provided to patient.     Willette Brace,  LPN   12/18/[id number]   Nurse Notes: None

## 2021-05-03 ENCOUNTER — Other Ambulatory Visit: Payer: Self-pay | Admitting: Family Medicine

## 2021-06-23 ENCOUNTER — Telehealth: Payer: Self-pay | Admitting: Pharmacist

## 2021-06-23 NOTE — Chronic Care Management (AMB) (Signed)
Chronic Care Management Pharmacy Assistant   Name: Bobby Lopez  MRN: [id number] DOB: 1941-05-20  Reason for Encounter: Disease State / Hypertension and Diabetes Assessment Call   Conditions to be addressed/monitored: HTN and DMII  Recent office visits:  03/19/2021 Charlott Rakes LPN - Medicare annual wellness exam   Recent consult visits:  None  Hospital visits:  None in previous 6 months  Medications: Outpatient Encounter Medications as of 06/23/2021  Medication Sig   amLODipine (NORVASC) 5 MG tablet TAKE ONE TABLET BY MOUTH EVERY MORNING   atorvastatin (LIPITOR) 20 MG tablet TAKE ONE TABLET BY MOUTH EVERY EVENING   diclofenac (VOLTAREN) 75 MG EC tablet TAKE ONE TABLET BY MOUTH TWICE A DAY AS NEEDED FOR PAIN   docusate sodium (COLACE) 100 MG capsule Take 1 capsule (100 mg total) by mouth 2 (two) times daily as needed (take to keep stool soft.). (Patient taking differently: Take 100 mg by mouth daily.)   glipiZIDE (GLUCOTROL) 5 MG tablet TAKE ONE TABLET BY MOUTH TWICE A DAY BEFORE A MEAL   loratadine (CLARITIN) 10 MG tablet Take 10 mg by mouth daily.   losartan-hydrochlorothiazide (HYZAAR) 50-12.5 MG tablet TAKE ONE TABLET BY MOUTH DAILY   metFORMIN (GLUCOPHAGE) 500 MG tablet TAKE ONE TABLET BY MOUTH TWICE A DAY WITH MEALS   Multiple Vitamin (MULTIVITAMIN WITH MINERALS) TABS tablet Take 1 tablet by mouth daily. Centrum silver   Omega-3 Fatty Acids (FISH OIL PO) Take by mouth daily.   Probiotic Product (ALIGN PO) Take 1 tablet by mouth daily.    Facility-Administered Encounter Medications as of 06/23/2021  Medication   0.9 %  sodium chloride infusion   Fill History : ATORVASTATIN CALCIUM  20 MG TABS 05/05/2021 90   LOSARTAN POTASSIUM/HYDROCHLOROTHIAZ IDE 50-12.5 MG TABS 04/11/2021 90   AMLODIPINE BESYLATE  5 MG TABS 05/05/2021 90   GLIPIZIDE  5 MG TABS 05/05/2021 90   METFORMIN HYDROCHLORIDE  500 MG TABS 05/05/2021 90   Reviewed chart prior to disease state  call. Spoke with patient regarding BP  Recent Office Vitals: BP Readings from Last 3 Encounters:  10/14/20 119/79  01/16/20 120/62  10/12/19 140/80   Pulse Readings from Last 3 Encounters:  10/14/20 65  01/16/20 66  10/12/19 75    Wt Readings from Last 3 Encounters:  10/14/20 206 lb 9.6 oz (93.7 kg)  01/16/20 200 lb 3.2 oz (90.8 kg)  10/12/19 206 lb 12.8 oz (93.8 kg)     Kidney Function Lab Results  Component Value Date/Time   CREATININE 1.19 10/14/2020 08:34 AM   CREATININE 1.19 10/12/2019 09:33 AM   CREATININE 1.0 12/08/2013 09:08 AM   GFR 57.93 (L) 10/14/2020 08:34 AM   GFRNONAA 73 (L) 07/21/2014 04:01 AM   GFRAA 84 (L) 07/21/2014 04:01 AM    BMP Latest Ref Rng & Units 10/14/2020 10/12/2019 10/10/2018  Glucose 70 - 99 mg/dL 133(H) 187(H) 186(H)  BUN 6 - 23 mg/dL 26(H) 15 17  Creatinine 0.40 - 1.50 mg/dL 1.19 1.19 1.11  Sodium 135 - 145 mEq/L 142 139 140  Potassium 3.5 - 5.1 mEq/L 4.8 3.9 4.0  Chloride 96 - 112 mEq/L 109 102 103  CO2 19 - 32 mEq/L 28 28 28   Calcium 8.4 - 10.5 mg/dL 9.4 9.7 9.8    Current antihypertensive regimen:  Amlodipine 5 mg  - take 1 tablet daily Hyzaar 50/12.5 mg - take 1 tablet daily  How often are you checking your Blood Pressure? Patient is not checking blood  pressure.  Current home BP readings: Patient is not checking his blood pressures  What recent interventions/DTPs have been made by any provider to improve Blood Pressure control since last CPP Visit: None  Any recent hospitalizations or ED visits since last visit with CPP? No  What diet changes have been made to improve Blood Pressure Control?  None Patient will have a bagel and tea for breakfast, a sandwich made with a meat for lunch and he eats out for dinner, he will eat a variety, whatever he is [place] for.  What exercise is being done to improve your Blood Pressure Control?  No changes  Adherence Review: Is the patient currently on ACE/ARB medication? Yes Does the  patient have >5 day gap between last estimated fill dates? No   Recent Relevant Labs: Lab Results  Component Value Date/Time   HGBA1C 5.5 10/14/2020 08:34 AM   HGBA1C 5.4 01/16/2020 07:56 AM   HGBA1C 7.5 (H) 10/12/2019 09:33 AM   MICROALBUR 1.2 03/10/2016 08:30 AM   MICROALBUR 1.3 09/05/2015 08:03 AM    Kidney Function Lab Results  Component Value Date/Time   CREATININE 1.19 10/14/2020 08:34 AM   CREATININE 1.19 10/12/2019 09:33 AM   CREATININE 1.0 12/08/2013 09:08 AM   GFR 57.93 (L) 10/14/2020 08:34 AM   GFRNONAA 73 (L) 07/21/2014 04:01 AM   GFRAA 84 (L) 07/21/2014 04:01 AM    Current antihyperglycemic regimen:  Glipizide 5 mg - take 1 tablet twice daily Metformin 500 mg - take 1 tablet twice daily  What recent interventions/DTPs have been made to improve glycemic control:  None, he isn't checking his blood sugars  Have there been any recent hospitalizations or ED visits since last visit with CPP? No  Patient denies hypoglycemic symptoms, including None  Patient denies hyperglycemic symptoms, including none  How often are you checking your blood sugar? Patient is not checking  What are your blood sugars ranging? Patient is not checking his blood sugars.  During the week, how often does your blood glucose drop below 70? Patient is not checking his blood sugars.  Are you checking your feet daily/regularly? Yes everyday with his shower  Adherence Review: Is the patient currently on a STATIN medication? Yes Is the patient currently on ACE/ARB medication? Yes Does the patient have >5 day gap between last estimated fill dates? No   Care Gaps: AWV - completed 03/19/2021 Last BP reading  - 119/79 on 10/14/2020  Last HGA1C - 5.5 on 10/14/2020  HGA1C - overdue Flu vaccine - due  Star Rating Drugs: Atorvastatin 5 mg - last filled 05/05/2021 [facility] at [facility] Glipizide 5 mg - last filled 05/05/2021 [facility] Losartan-HCTZ 50/12.5 mg - last filled  04/11/2021 [facility] at [facility] Metformin 500 mg - last filled 05/05/2021 90[facility] at [place] Pharmacist Assistant [phone]

## 2021-06-27 ENCOUNTER — Telehealth: Payer: Self-pay | Admitting: Pharmacist

## 2021-06-27 NOTE — Chronic Care Management (AMB) (Signed)
    Chronic Care Management Pharmacy Assistant   Name: Bobby Lopez  MRN: [id number] DOB: [birth year]   Reason for Encounter: Schedule follow up visit Spoke with patient and his wife, they do not wish to continue with our program.  The benefits of following with Jeni Salles were explained in detail.  Wife states this isn't something they need assistance with at this time and wish to be unenrolled.    Care Gaps: AWV - completed 03/19/2021 Last BP reading  - 119/79 on 10/14/2020  Last HGA1C - 5.5 on 10/14/2020  HGA1C - overdue Flu vaccine - due  Star Rating Drugs: Atorvastatin 5 mg - last filled 05/05/2021 [facility] at [facility] Glipizide 5 mg - last filled 05/05/2021 [facility] Losartan-HCTZ 50/12.5 mg - last filled 04/11/2021 [facility] at [facility] Metformin 500 mg - last filled 05/05/2021 90[facility] at [place] Pharmacist Assistant [phone]

## 2021-09-18 LAB — HM DIABETES EYE EXAM

## 2021-10-21 ENCOUNTER — Encounter: Payer: Medicare HMO | Admitting: Family Medicine

## 2021-10-21 ENCOUNTER — Encounter: Payer: Medicare Other | Admitting: Family Medicine

## 2021-10-22 ENCOUNTER — Ambulatory Visit (INDEPENDENT_AMBULATORY_CARE_PROVIDER_SITE_OTHER): Payer: Medicare HMO | Admitting: Family Medicine

## 2021-10-22 ENCOUNTER — Encounter: Payer: Self-pay | Admitting: Family Medicine

## 2021-10-22 VITALS — BP 120/64 | HR 71 | Temp 98.6°F | Ht 72.0 in | Wt 211.0 lb

## 2021-10-22 DIAGNOSIS — N138 Other obstructive and reflux uropathy: Secondary | ICD-10-CM | POA: Diagnosis not present

## 2021-10-22 DIAGNOSIS — I10 Essential (primary) hypertension: Secondary | ICD-10-CM

## 2021-10-22 DIAGNOSIS — E119 Type 2 diabetes mellitus without complications: Secondary | ICD-10-CM | POA: Diagnosis not present

## 2021-10-22 DIAGNOSIS — M159 Polyosteoarthritis, unspecified: Secondary | ICD-10-CM

## 2021-10-22 DIAGNOSIS — N401 Enlarged prostate with lower urinary tract symptoms: Secondary | ICD-10-CM

## 2021-10-22 DIAGNOSIS — E782 Mixed hyperlipidemia: Secondary | ICD-10-CM

## 2021-10-22 MED ORDER — ATORVASTATIN CALCIUM 20 MG PO TABS: 20.0000 mg | ORAL_TABLET | Freq: Every evening | ORAL | 3 refills | Status: DC

## 2021-10-22 MED ORDER — DICLOFENAC SODIUM 75 MG PO TBEC: DELAYED_RELEASE_TABLET | ORAL | 3 refills | Status: DC

## 2021-10-22 MED ORDER — AMLODIPINE BESYLATE 5 MG PO TABS
5.0000 mg | ORAL_TABLET | Freq: Every morning | ORAL | 3 refills | Status: DC
Start: 2021-10-22 — End: 2022-10-20

## 2021-10-22 MED ORDER — METFORMIN HCL 500 MG PO TABS: 500.0000 mg | ORAL_TABLET | Freq: Two times a day (BID) | ORAL | 3 refills | Status: DC

## 2021-10-22 NOTE — Progress Notes (Signed)
Subjective:    Patient ID: Bobby Lopez, male    DOB: 06/23/1941, 81 y.o.   MRN: [id number]  HPI Here to follow up on issues.  His arthritis is tolerable thanks to the Diclofenac. His BP is stable. His BPH is stable with nocturia times 2. He tries to go outside and walk when the weather permits.    Review of Systems  Constitutional: Negative.   HENT: Negative.    Eyes: Negative.   Respiratory: Negative.    Cardiovascular: Negative.   Gastrointestinal: Negative.   Genitourinary: Negative.   Musculoskeletal:  Positive for arthralgias and back pain.  Skin: Negative.   Neurological: Negative.   Psychiatric/Behavioral: Negative.        Objective:   Physical Exam Constitutional:      General: He is not in acute distress.    Appearance: Normal appearance. He is well-developed. He is not diaphoretic.  HENT:     Head: Normocephalic and atraumatic.     Right Ear: External ear normal.     Left Ear: External ear normal.     Nose: Nose normal.     Mouth/Throat:     Pharynx: No oropharyngeal exudate.  Eyes:     General: No scleral icterus.       Right eye: No discharge.        Left eye: No discharge.     Conjunctiva/sclera: Conjunctivae normal.     Pupils: Pupils are equal, round, and reactive to light.  Neck:     Thyroid: No thyromegaly.     Vascular: No JVD.     Trachea: No tracheal deviation.  Cardiovascular:     Rate and Rhythm: Normal rate and regular rhythm.     Heart sounds: Normal heart sounds. No murmur heard.   No friction rub. No gallop.  Pulmonary:     Effort: Pulmonary effort is normal. No respiratory distress.     Breath sounds: Normal breath sounds. No wheezing or rales.  Chest:     Chest wall: No tenderness.  Abdominal:     General: Bowel sounds are normal. There is no distension.     Palpations: Abdomen is soft. There is no mass.     Tenderness: There is no abdominal tenderness. There is no guarding or rebound.  Genitourinary:    Penis: Normal. No  tenderness.      Testes: Normal.     Prostate: Normal.     Rectum: Normal. Guaiac result negative.  Musculoskeletal:        General: No tenderness. Normal range of motion.     Cervical back: Neck supple.  Lymphadenopathy:     Cervical: No cervical adenopathy.  Skin:    General: Skin is warm and dry.     Coloration: Skin is not pale.     Findings: No erythema or rash.  Neurological:     Mental Status: He is alert and oriented to person, place, and time.     Cranial Nerves: No cranial nerve deficit.     Motor: No abnormal muscle tone.     Coordination: Coordination normal.     Deep Tendon Reflexes: Reflexes are normal and symmetric. Reflexes normal.  Psychiatric:        Behavior: Behavior normal.        Thought Content: Thought content normal.        Judgment: Judgment normal.          Assessment & Plan:  His HTN and OA are stable. His BPH  is stable. We will get fasting labs to check his lipids and an A1c to follow the diabetes and dyslipidemia. We spent a total of ( 34  ) minutes reviewing records and discussing these issues.  Alysia Penna, MD

## 2021-10-23 LAB — LIPID PANEL
Cholesterol: 91 mg/dL (ref 0–200)
HDL: 32.8 mg/dL — ABNORMAL LOW (ref >39.00)
LDL Cholesterol: 37 mg/dL (ref 0–99)
NonHDL: 58.42
Total CHOL/HDL Ratio: 3
Triglycerides: 108 mg/dL (ref 0.0–149.0)
VLDL: 21.6 mg/dL (ref 0.0–40.0)

## 2021-10-23 LAB — BASIC METABOLIC PANEL
BUN: 28 mg/dL — ABNORMAL HIGH (ref 6–23)
CO2: 27 mEq/L (ref 19–32)
Calcium: 9.5 mg/dL (ref 8.4–10.5)
Chloride: 106 mEq/L (ref 96–112)
Creatinine, Ser: 1.31 mg/dL (ref 0.40–1.50)
GFR: 51.26 mL/min — ABNORMAL LOW (ref >60.00)
Glucose, Bld: 80 mg/dL (ref 70–99)
Potassium: 4.2 mEq/L (ref 3.5–5.1)
Sodium: 143 mEq/L (ref 135–145)

## 2021-10-23 LAB — HEPATIC FUNCTION PANEL
ALT: 22 U/L (ref 0–53)
AST: 19 U/L (ref 0–37)
Albumin: 4.1 g/dL (ref 3.5–5.2)
Alkaline Phosphatase: 41 U/L (ref 39–117)
Bilirubin, Direct: 0.2 mg/dL (ref 0.0–0.3)
Total Bilirubin: 1.2 mg/dL (ref 0.2–1.2)
Total Protein: 7.1 g/dL (ref 6.0–8.3)

## 2021-10-23 LAB — CBC WITH DIFFERENTIAL/PLATELET
Basophils Absolute: 0.1 10*3/uL (ref 0.0–0.1)
Basophils Relative: 0.9 % (ref 0.0–3.0)
Eosinophils Absolute: 0.1 10*3/uL (ref 0.0–0.7)
Eosinophils Relative: 1.4 % (ref 0.0–5.0)
HCT: 40.5 % (ref 39.0–52.0)
Hemoglobin: 13.8 g/dL (ref 13.0–17.0)
Lymphocytes Relative: 34.7 % (ref 12.0–46.0)
Lymphs Abs: 3 10*3/uL (ref 0.7–4.0)
MCHC: 34.1 g/dL (ref 30.0–36.0)
MCV: 91.2 fl (ref 78.0–100.0)
Monocytes Absolute: 0.7 10*3/uL (ref 0.1–1.0)
Monocytes Relative: 8.6 % (ref 3.0–12.0)
Neutro Abs: 4.7 10*3/uL (ref 1.4–7.7)
Neutrophils Relative %: 54.4 % (ref 43.0–77.0)
Platelets: 138 10*3/uL — ABNORMAL LOW (ref 150.0–400.0)
RBC: 4.44 Mil/uL (ref 4.22–5.81)
RDW: 12.9 % (ref 11.5–15.5)
WBC: 8.6 10*3/uL (ref 4.0–10.5)

## 2021-10-23 LAB — PSA: PSA: 1.69 ng/mL (ref 0.10–4.00)

## 2021-10-23 LAB — TSH: TSH: 3.93 u[IU]/mL (ref 0.35–5.50)

## 2021-10-23 LAB — HEMOGLOBIN A1C: Hgb A1c MFr Bld: 5.6 % (ref 4.6–6.5)

## 2021-11-17 ENCOUNTER — Ambulatory Visit (INDEPENDENT_AMBULATORY_CARE_PROVIDER_SITE_OTHER): Payer: Medicare HMO | Admitting: Family Medicine

## 2021-11-17 ENCOUNTER — Encounter: Payer: Self-pay | Admitting: Family Medicine

## 2021-11-17 VITALS — BP 110/60 | HR 67 | Temp 97.7°F | Wt 209.0 lb

## 2021-11-17 DIAGNOSIS — S39011A Strain of muscle, fascia and tendon of abdomen, initial encounter: Secondary | ICD-10-CM | POA: Diagnosis not present

## 2021-11-17 DIAGNOSIS — S20222A Contusion of left back wall of thorax, initial encounter: Secondary | ICD-10-CM

## 2021-11-17 DIAGNOSIS — S300XXA Contusion of lower back and pelvis, initial encounter: Secondary | ICD-10-CM

## 2021-11-17 NOTE — Progress Notes (Signed)
? ?  Subjective:  ? ? Patient ID: Bobby Lopez, male    DOB: Jul 09, 1941, 81 y.o.   MRN: [id number] ? ?HPI ?Here to check injuries from a fall at home 3 days ago. As he was walking down some steps (holding the hand rail) his foot missed a step and he fell backwards onto the steps. No head trauma. He hit his upper back and his lower back on the edges of 2 steps. He had a lot of pain the first 24 hours, but this has let up quite a bit since then. No SOB. He has no bowel or urinary problems. He took 2 Tylenol tablets the day it happened, but nothing since then. He has a longstanding small ventral hernia, and this has been sore since the fall.  ? ? ?Review of Systems  ?Constitutional: Negative.   ?Respiratory: Negative.    ?Cardiovascular: Negative.   ?Gastrointestinal:  Positive for abdominal pain. Negative for abdominal distention, blood in stool, constipation, diarrhea, nausea and vomiting.  ?Genitourinary: Negative.   ? ?   ?Objective:  ? Physical Exam ?Constitutional:   ?   General: He is not in acute distress. ?   Appearance: Normal appearance.  ?HENT:  ?   Head: Normocephalic and atraumatic.  ?Cardiovascular:  ?   Rate and Rhythm: Normal rate and regular rhythm.  ?   Pulses: Normal pulses.  ?   Heart sounds: Normal heart sounds.  ?Pulmonary:  ?   Effort: Pulmonary effort is normal.  ?   Breath sounds: Normal breath sounds.  ?   Comments: No rib tenderness  ?Abdominal:  ?   General: Abdomen is flat. Bowel sounds are normal. There is no distension.  ?   Palpations: Abdomen is soft. There is no mass.  ?   Tenderness: There is no right CVA tenderness, left CVA tenderness, guarding or rebound.  ?   Comments: He has a small ventral hernia which is reducible and only slightly tender   ?Musculoskeletal:  ?   Cervical back: Neck supple.  ?   Comments: Mildly tender over the left upper back and left lower back, spine has full ROM. No ecchymoses   ?Lymphadenopathy:  ?   Cervical: No cervical adenopathy.  ?Neurological:  ?    Mental Status: He is alert.  ? ? ? ? ? ?   ?Assessment & Plan:  ?Contusions to the upper and lower back., plus a mild abdominal strain. He will rest. This should resolve over the next week. Recheck as needed.  ?Alysia Penna, MD ? ? ?

## 2022-03-20 ENCOUNTER — Ambulatory Visit (INDEPENDENT_AMBULATORY_CARE_PROVIDER_SITE_OTHER): Payer: Medicare HMO

## 2022-03-20 VITALS — BP 122/62 | HR 63 | Temp 97.8°F | Ht 72.0 in | Wt 205.3 lb

## 2022-03-20 DIAGNOSIS — Z0000 Encounter for general adult medical examination without abnormal findings: Secondary | ICD-10-CM | POA: Diagnosis not present

## 2022-03-20 NOTE — Progress Notes (Signed)
Subjective:   Bobby Lopez is a 81 y.o. male who presents for Medicare Annual/Subsequent preventive examination.  Review of Systems     Cardiac Risk Factors include: advanced age (>63mn, >>23women);diabetes mellitus;hypertension;male gender     Objective:    Today's Vitals   03/20/22 0925  BP: 122/62  Pulse: 63  Temp: 97.8 F (36.6 C)  TempSrc: Oral  SpO2: 97%  Weight: 205 lb 4.8 oz (93.1 kg)  Height: 6' (1.829 m)   Body mass index is 27.84 kg/m.     03/20/2022    9:39 AM 10/10/2018    8:28 AM 10/06/2017    8:47 AM 03/10/2017    9:06 AM 07/20/2014    3:28 PM 07/13/2014   11:45 AM 10/23/2013    1:54 PM  Advanced Directives  Does Patient Have a Medical Advance Directive? Yes Yes Yes Yes Yes Yes Patient has advance directive, copy in chart  Type of Advance Directive [place] will Living will;Healthcare Power of [place] will Living will Living will   Does patient want to make changes to medical advance directive? No - Patient declined No - Patient declined     No  Copy of Healthcare Power of Attorney in Chart? Yes - validated most recent copy scanned in chart (See row information) Yes - validated most recent copy scanned in chart (See row information)   Yes Yes     Current Medications (verified) Outpatient Encounter Medications as of 03/20/2022  Medication Sig   amLODipine (NORVASC) 5 MG tablet Take 1 tablet (5 mg total) by mouth every morning.   atorvastatin (LIPITOR) 20 MG tablet Take 1 tablet (20 mg total) by mouth every evening.   diclofenac (VOLTAREN) 75 MG EC tablet TAKE ONE TABLET BY MOUTH TWICE A DAY AS NEEDED FOR PAIN   docusate sodium (COLACE) 100 MG capsule Take 1 capsule (100 mg total) by mouth 2 (two) times daily as needed (take to keep stool soft.). (Patient taking differently: Take 100 mg by mouth daily.)   glipiZIDE (GLUCOTROL) 5 MG tablet TAKE ONE TABLET BY MOUTH TWICE A DAY BEFORE A MEAL    loratadine (CLARITIN) 10 MG tablet Take 10 mg by mouth daily.   losartan-hydrochlorothiazide (HYZAAR) 50-12.5 MG tablet TAKE ONE TABLET BY MOUTH DAILY   metFORMIN (GLUCOPHAGE) 500 MG tablet Take 1 tablet (500 mg total) by mouth 2 (two) times daily with a meal.   Multiple Vitamin (MULTIVITAMIN WITH MINERALS) TABS tablet Take 1 tablet by mouth daily. Centrum silver   Omega-3 Fatty Acids (FISH OIL PO) Take by mouth daily.   Probiotic Product (ALIGN PO) Take 1 tablet by mouth daily.    Facility-Administered Encounter Medications as of 03/20/2022  Medication   0.9 %  sodium chloride infusion    Allergies (verified) Ace inhibitors and Hydrocodone   History: Past Medical History:  Diagnosis Date   Arthritis    Back   Bladder stones    NEEDS TO HAVE SURGERY   BPH (benign prostatic hyperplasia)    Diabetes mellitus    type  II   DVT (deep venous thrombosis) (HCC)    Eczema    Hyperlipidemia    Hypertension    Lumbar herniated disc    Past Surgical History:  Procedure Laterality Date   CHOLECYSTECTOMY     COLONOSCOPY  03/24/2017   per Dr. NSilverio Decamp adenomatous polyps, no repeats due to age    CGenoaBilateral 07/20/2014  Procedure: CYSTOLITHALOPAXY;  Surgeon: Ardis Hughs, MD;  Location: [facility];  Service: Urology;  Laterality: Bilateral;   HOLMIUM LASER APPLICATION N/A 89/10/1192   Procedure: HOLMIUM LASER APPLICATION;  Surgeon: Ardis Hughs, MD;  Location: [facility];  Service: Urology;  Laterality: N/A;   INSERTION OF MESH N/A 10/19/2013   Procedure: INSERTION OF MESH;  Surgeon: Merrie Roof, MD;  Location: [facility];  Service: General;  Laterality: N/A;   LUMBAR LAMINECTOMY/DECOMPRESSION MICRODISCECTOMY  09/26/2012   Procedure: LUMBAR LAMINECTOMY/DECOMPRESSION MICRODISCECTOMY 1 LEVEL;  Surgeon: Elaina Hoops, MD;  Location: [facility];  Service: Neurosurgery;  Laterality: Left;  Lumbar Lamiectomy Extraforaminal Diskectomy Lumbar Three-Four Left   TRANSURETHRAL  RESECTION OF PROSTATE N/A 07/20/2014   Procedure: TRANSURETHRAL RESECTION OF THE PROSTATE (TURP) WITH GYRUS;  Surgeon: Ardis Hughs, MD;  Location: [facility];  Service: Urology;  Laterality: N/A;   VENTRAL HERNIA REPAIR  10/19/2013   DR TOTH   VENTRAL HERNIA REPAIR N/A 10/19/2013   Procedure: LAPAROSCOPIC VENTRAL HERNIA;  Surgeon: Merrie Roof, MD;  Location: [facility];  Service: General;  Laterality: N/A;   Family History  Problem Relation Age of Onset   Diabetes Mother    Hypertension Mother    Esophageal cancer Sister    Colon cancer Neg Hx    Rectal cancer Neg Hx    Stomach cancer Neg Hx    Social History   Socioeconomic History   Marital status: Married    Spouse name: Not on file   Number of children: Not on file   Years of education: Not on file   Highest education level: Not on file  Occupational History    Comment: retired Building control surveyor  Tobacco Use   Smoking status: Former    Types: Cigarettes    Quit date: 07/13/1973    Years since quitting: 48.7   Smokeless tobacco: Never   Tobacco comments:    quit > 46 years ago  Vaping Use   Vaping Use: Not on file  Substance and Sexual Activity   Alcohol use: No    Alcohol/week: 0.0 standard drinks of alcohol   Drug use: No   Sexual activity: Not Currently  Other Topics Concern   Not on file  Social History Narrative   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   Lives with wife and dog who he has had for many years   Walks dog for exercise, but walking ability limited due to back and hip pain   Social Determinants of Health   Financial Resource Strain: Low Risk  (03/20/2022)   Overall Financial Resource Strain (CARDIA)    Difficulty of Paying Living Expenses: Not hard at all  Food Insecurity: No Food Insecurity (03/20/2022)   Hunger Vital Sign    Worried About Running Out of Food in the Last Year: Never true    [place] in the Last Year: Never true  Transportation Needs: No Transportation Needs (03/20/2022)   PRAPARE -  Hydrologist (Medical): No    Lack of Transportation (Non-Medical): No  Physical Activity: Insufficiently Active (03/20/2022)   Exercise Vital Sign    Days of Exercise per Week: 7 days    Minutes of Exercise per Session: 20 min  Stress: No Stress Concern Present (03/20/2022)   [place]    Feeling of Stress : Not at all  Social Connections: Moderately Isolated (03/20/2022)   Social Connection  and Isolation Panel [NHANES]    Frequency of Communication with Friends and Family: More than three times a week    Frequency of Social Gatherings with Friends and Family: More than three times a week    Attends Religious Services: Never    Marine scientist or Organizations: No    Attends Archivist Meetings: Never    Marital Status: Married    Tobacco Counseling Counseling given: Not Answered Tobacco comments: quit > 46 years ago   Clinical Intake:   Diabetic?  Yes Pre Diabetic  Interpreter Needed?: NoActivities of Daily Living    03/20/2022    9:36 AM  In your present state of health, do you have any difficulty performing the following activities:  Hearing? 1  Comment Wears hearing aids  Vision? 0  Difficulty concentrating or making decisions? 0  Walking or climbing stairs? 0  Dressing or bathing? 0  Doing errands, shopping? 0  Preparing Food and eating ? N  Using the Toilet? N  In the past six months, have you accidently leaked urine? N  Do you have problems with loss of bowel control? N  Managing your Medications? N  Managing your Finances? N  Housekeeping or managing your Housekeeping? N    Patient Care Team: Laurey Morale, MD as PCP - General Louis Meckel Viona Gilmore, MD as Attending Physician (Urology) Heath Lark, MD as Consulting Physician (Hematology and Oncology) Kary Kos, MD as Consulting Physician (Neurosurgery) Viona Gilmore, [facility] as Pharmacist  (Pharmacist)  Indicate any recent Medical Services you may have received from other than [facility] providers in the past year (date may be approximate).     Assessment:   This is a routine wellness examination for [place].  Hearing/Vision screen Hearing Screening - Comments:: Wears hearing aids Vision Screening - Comments:: Wears glasses. Followed by Dr Herbert Deaner  Dietary issues and exercise activities discussed: Exercise limited by: None identified   Goals Addressed               This Visit's Progress     Stay healthy (pt-stated)         Depression Screen    03/20/2022    9:34 AM 11/17/2021    2:13 PM 03/19/2021   10:52 AM 10/10/2018    8:14 AM 10/06/2017    8:48 AM 10/06/2017    8:06 AM 09/13/2015   10:37 AM  PHQ 2/9 Scores  PHQ - 2 Score 0 0 0 2 0 0 0  PHQ- 9 Score  2  8       Fall Risk    03/20/2022    9:37 AM 11/17/2021    2:12 PM 10/22/2021    3:58 PM 10/10/2018    8:14 AM 10/06/2017    8:48 AM  Fall Risk   Falls in the past year? 1 1 0 1 No  Number falls in past yr: 0 0 0 0   Injury with Fall? 0 1 0 1   Comment No injury. Followed by PCP Soreness on back/ abdomen     Risk for fall due to : No Fall Risks No Fall Risks No Fall Risks History of fall(s);Impaired vision   Follow up    Falls evaluation completed     FALL RISK PREVENTION PERTAINING TO THE HOME:  Any stairs in or around the home? Yes  If so, are there any without handrails? No  Home free of loose throw rugs in walkways, pet beds, electrical cords, etc? Yes  Adequate lighting in your home to reduce risk of falls? Yes   ASSISTIVE DEVICES UTILIZED TO PREVENT FALLS:  Life alert? No  Use of a cane, walker or w/c? No  Grab bars in the bathroom? No  Shower chair or bench in shower? No  Elevated toilet seat or a handicapped toilet? No   TIMED UP AND GO:  Was the test performed? Yes .  Length of time to ambulate 10 feet: 5 sec.   Gait steady and fast without use of assistive device  Cognitive Function:         03/20/2022    9:39 AM  6CIT Screen  What Year? 0 points  What month? 0 points  What time? 0 points  Count back from 20 0 points  Months in reverse 0 points  Repeat phrase 0 points  Total Score 0 points    Immunizations Immunization History  Administered Date(s) Administered   Fluad Quad(high Dose 65+) 07/04/2019, 07/01/2020   Influenza Split 06/20/2012, 06/01/2013   Influenza Whole 06/25/2008, 06/18/2009   Influenza, High Dose Seasonal PF 07/10/2015, 06/05/2016, 05/24/2018   Influenza,inj,quad, With Preservative 06/14/2017   Influenza-Unspecified 06/11/2014, 06/14/2017, 06/17/2021   Moderna Sars-Covid-2 Vaccination 09/25/2019, 10/23/2019, 05/03/2020, 12/23/2020   Pneumococcal Conjugate-13 10/01/2014   Pneumococcal Polysaccharide-23 07/30/2011   Tdap 04/10/2016   Zoster Recombinat (Shingrix) 10/19/2018, 12/22/2018   Zoster, Live 09/11/2014    TDAP status: Up to date  Flu Vaccine status: Up to date  Pneumococcal vaccine status: Up to date  Covid-19 vaccine status: Completed vaccines  Qualifies for Shingles Vaccine? Yes   Zostavax completed Yes   Shingrix Completed?: Yes  Screening Tests Health Maintenance  Topic Date Due   FOOT EXAM  10/11/2019   COVID-19 Vaccine (5 - Moderna series) 11/12/2022 (Originally 02/17/2021)   INFLUENZA VACCINE  04/14/2022   HEMOGLOBIN A1C  04/21/2022   OPHTHALMOLOGY EXAM  09/18/2022   TETANUS/TDAP  04/10/2026   Pneumonia Vaccine 60+ Years old  Completed   Zoster Vaccines- Shingrix  Completed   HPV VACCINES  Aged Out    Health Maintenance  Health Maintenance Due  Topic Date Due   FOOT EXAM  10/11/2019    Colorectal cancer screening: No longer required.   Lung Cancer Screening: (Low Dose CT Chest recommended if Age 55-80 years, 30 pack-year currently smoking OR have quit w/in 15years.) does not qualify.     Additional Screening:  Hepatitis C Screening: does not qualify; Completed   Vision Screening: Recommended annual  ophthalmology exams for early detection of glaucoma and other disorders of the eye. Is the patient up to date with their annual eye exam?  Yes  Who is the provider or what is the name of the office in which the patient attends annual eye exams? Dr Herbert Deaner If pt is not established with a provider, would they like to be referred to a provider to establish care? No .   Dental Screening: Recommended annual dental exams for proper oral hygiene  Community Resource Referral / Chronic Care Management:   CRR required this visit?  No   CCM required this visit?  No      Plan:     I have personally reviewed and noted the following in the patient's chart:   Medical and social history Use of alcohol, tobacco or illicit drugs  Current medications and supplements including opioid prescriptions. Patient is not currently taking opioid prescriptions. Functional ability and status Nutritional status Physical activity Advanced directives List of other physicians Hospitalizations, surgeries, and  ER visits in previous 12 months Vitals Screenings to include cognitive, depression, and falls Referrals and appointments  In addition, I have reviewed and discussed with patient certain preventive protocols, quality metrics, and best practice recommendations. A written personalized care plan for preventive services as well as general preventive health recommendations were provided to patient.     Criselda Peaches, LPN   02/13/8314   Nurse Notes: None

## 2022-03-20 NOTE — Patient Instructions (Signed)
Bobby Lopez , Thank you for taking time to come for your Medicare Wellness Visit. I appreciate your ongoing commitment to your health goals. Please review the following plan we discussed and let me know if I can assist you in the future.   These are the goals we discussed:  Goals      Patient Stated     Continue your exercise      Patient Stated     Continue walking daily as tolerated.     Patient Stated     None at this time      Pharmacy Care Plan     CARE PLAN ENTRY  Current Barriers:  Chronic Disease Management support, education, and care coordination needs related to Hypertension, Hyperlipidemia, Diabetes, and Osteoarthritis  Hypertension (high blood pressure) Current antihypertensive regimen:  Amlodipine '5mg'$ , 1 tablet every morning  Hydrochlorothiazide 12.'5mg'$ , 1 capsule once daily  Losartan '50mg'$ , 1 tablet once daily  Previous antihypertensives tried: benazepril (stopped due to cough) Last practice recorded BP readings:  BP Readings from Last 3 Encounters:  01/16/20 120/62  10/12/19 140/80  10/10/18 126/72  Current home BP readings: patient does not check at home   Hyperlipidemia (high cholesterol) Current antihyperlipidemic regimen: atorvastatin '20mg'$ , 1 tablet every evening Previous antihyperlipidemic medications tried: none Most recent lipid panel:     Component Value Date/Time   CHOL 111 10/12/2019 0933   TRIG 155.0 (H) 10/12/2019 0933   HDL 36.60 (L) 10/12/2019 0933   CHOLHDL 3 10/12/2019 0933   VLDL 31.0 10/12/2019 0933   LDLCALC 43 10/12/2019 0933  Diabetes Lab Results  Component Value Date   HGBA1C 5.4 01/16/2020  Current antihyperglycemic regimen:  metformin '500mg'$ , 1 tablet twice daily with a meal   glipizide '5mg'$ , 1 tablet twice daily before a meal  Current meal patterns: patient stated decreasing soda intake as well as daily sugars  Pharmacist Clinical Goal(s):  Hypertension Over the next 180 days, patient will maintain blood pressure;  <140/90 Hyperlipidemia Over the next 180 days, patient will continue with diet and exercise modifications to lower ASCVD risk and maintain cholesterol levels at goal: Cholesterol goals: Total Cholesterol goal under 200, Triglycerides goal under 150, HDL goal above 40 (men) or above 50 (women), LDL goal under 100.  Diabetes Over the next the 3 months patient will return for follow-up visit with Dr. Sarajane Jews for repeat A1c.  A1c goal: <7.0%  Interventions: Comprehensive medication review performed; medication list updated in electronic medical record.  Hypertension Discussed diet and exercise and affect on blood pressure.  DASH diet: following a diet emphasizing fruits and vegetables and low-fat dairy products along with whole grains, fish, poultry, and nuts. Reducing red meats and sugars.  Hyperlipidemia How to reduce cholesterol through diet/weight management and physical activity.    We discussed how a diet high in plant sterols (fruits/vegetables/nuts/whole grains/legumes) may reduce your cholesterol.  Encouraged increasing fiber to a daily intake of 10-25g/day  Diabetes Discussed importance of preventative diabetic health exams.  Patient Self Care Activities:  Patient verbalizes understanding of plan as described above, Self administers medications as prescribed, Calls pharmacy for medication refills, and Calls provider office for new concerns or questions Continue current medications as directed by providers.  Continue following up with primary care provider and/or specialists. Continue routine of healthy habits (diet/ exercise).  Please see past updates related to this goal by clicking on the "Past Updates" button in the selected goal          This is  a list of the screening recommended for you and due dates:  Health Maintenance  Topic Date Due   Complete foot exam   10/11/2019   COVID-19 Vaccine (5 - Moderna series) 11/12/2022*   Flu Shot  04/14/2022   Hemoglobin A1C  04/21/2022    Eye exam for diabetics  09/18/2022   Tetanus Vaccine  04/10/2026   Pneumonia Vaccine  Completed   Zoster (Shingles) Vaccine  Completed   HPV Vaccine  Aged Out  *Topic was postponed. The date shown is not the original due date.   Advanced directives: Yes  Conditions/risks identified: None  Next appointment: Follow up in one year for your annual wellness visit.    Preventive Care 3 Years and Older, Male Preventive care refers to lifestyle choices and visits with your health care provider that can promote health and wellness. What does preventive care include? A yearly physical exam. This is also called an annual well check. Dental exams once or twice a year. Routine eye exams. Ask your health care provider how often you should have your eyes checked. Personal lifestyle choices, including: Daily care of your teeth and gums. Regular physical activity. Eating a healthy diet. Avoiding tobacco and drug use. Limiting alcohol use. Practicing safe sex. Taking low doses of aspirin every day. Taking vitamin and mineral supplements as recommended by your health care provider. What happens during an annual well check? The services and screenings done by your health care provider during your annual well check will depend on your age, overall health, lifestyle risk factors, and family history of disease. Counseling  Your health care provider may ask you questions about your: Alcohol use. Tobacco use. Drug use. Emotional well-being. Home and relationship well-being. Sexual activity. Eating habits. History of falls. Memory and ability to understand (cognition). Work and work Statistician. Screening  You may have the following tests or measurements: Height, weight, and BMI. Blood pressure. Lipid and cholesterol levels. These may be checked every 5 years, or more frequently if you are over 17 years old. Skin check. Lung cancer screening. You may have this screening every year starting  at age 47 if you have a 30-pack-year history of smoking and currently smoke or have quit within the past 15 years. Fecal occult blood test (FOBT) of the stool. You may have this test every year starting at age 55. Flexible sigmoidoscopy or colonoscopy. You may have a sigmoidoscopy every 5 years or a colonoscopy every 10 years starting at age 15. Prostate cancer screening. Recommendations will vary depending on your family history and other risks. Hepatitis C blood test. Hepatitis B blood test. Sexually transmitted disease (STD) testing. Diabetes screening. This is done by checking your blood sugar (glucose) after you have not eaten for a while (fasting). You may have this done every 1-3 years. Abdominal aortic aneurysm (AAA) screening. You may need this if you are a current or former smoker. Osteoporosis. You may be screened starting at age 42 if you are at high risk. Talk with your health care provider about your test results, treatment options, and if necessary, the need for more tests. Vaccines  Your health care provider may recommend certain vaccines, such as: Influenza vaccine. This is recommended every year. Tetanus, diphtheria, and acellular pertussis (Tdap, Td) vaccine. You may need a Td booster every 10 years. Zoster vaccine. You may need this after age 41. Pneumococcal 13-valent conjugate (PCV13) vaccine. One dose is recommended after age 76. Pneumococcal polysaccharide (PPSV23) vaccine. One dose is recommended after age  9. Talk to your health care provider about which screenings and vaccines you need and how often you need them. This information is not intended to replace advice given to you by your health care provider. Make sure you discuss any questions you have with your health care provider. Document Released: 09/27/2015 Document Revised: 05/20/2016 Document Reviewed: 07/02/2015 Elsevier Interactive Patient Education  2017 [place] Prevention in the Home Falls can  cause injuries. They can happen to people of all ages. There are many things you can do to make your home safe and to help prevent falls. What can I do on the outside of my home? Regularly fix the edges of walkways and driveways and fix any cracks. Remove anything that might make you trip as you walk through a door, such as a raised step or threshold. Trim any bushes or trees on the path to your home. Use bright outdoor lighting. Clear any walking paths of anything that might make someone trip, such as rocks or tools. Regularly check to see if handrails are loose or broken. Make sure that both sides of any steps have handrails. Any raised decks and porches should have guardrails on the edges. Have any leaves, snow, or ice cleared regularly. Use sand or salt on walking paths during winter. Clean up any spills in your garage right away. This includes oil or grease spills. What can I do in the bathroom? Use night lights. Install grab bars by the toilet and in the tub and shower. Do not use towel bars as grab bars. Use non-skid mats or decals in the tub or shower. If you need to sit down in the shower, use a plastic, non-slip stool. Keep the floor dry. Clean up any water that spills on the floor as soon as it happens. Remove soap buildup in the tub or shower regularly. Attach bath mats securely with double-sided non-slip rug tape. Do not have throw rugs and other things on the floor that can make you trip. What can I do in the bedroom? Use night lights. Make sure that you have a light by your bed that is easy to reach. Do not use any sheets or blankets that are too big for your bed. They should not hang down onto the floor. Have a firm chair that has side arms. You can use this for support while you get dressed. Do not have throw rugs and other things on the floor that can make you trip. What can I do in the kitchen? Clean up any spills right away. Avoid walking on wet floors. Keep items  that you use a lot in easy-to-reach places. If you need to reach something above you, use a strong step stool that has a grab bar. Keep electrical cords out of the way. Do not use floor polish or wax that makes floors slippery. If you must use wax, use non-skid floor wax. Do not have throw rugs and other things on the floor that can make you trip. What can I do with my stairs? Do not leave any items on the stairs. Make sure that there are handrails on both sides of the stairs and use them. Fix handrails that are broken or loose. Make sure that handrails are as long as the stairways. Check any carpeting to make sure that it is firmly attached to the stairs. Fix any carpet that is loose or worn. Avoid having throw rugs at the top or bottom of the stairs. If you do have  throw rugs, attach them to the floor with carpet tape. Make sure that you have a light switch at the top of the stairs and the bottom of the stairs. If you do not have them, ask someone to add them for you. What else can I do to help prevent falls? Wear shoes that: Do not have high heels. Have rubber bottoms. Are comfortable and fit you well. Are closed at the toe. Do not wear sandals. If you use a stepladder: Make sure that it is fully opened. Do not climb a closed stepladder. Make sure that both sides of the stepladder are locked into place. Ask someone to hold it for you, if possible. Clearly mark and make sure that you can see: Any grab bars or handrails. First and last steps. Where the edge of each step is. Use tools that help you move around (mobility aids) if they are needed. These include: Canes. Walkers. Scooters. Crutches. Turn on the lights when you go into a dark area. Replace any light bulbs as soon as they burn out. Set up your furniture so you have a clear path. Avoid moving your furniture around. If any of your floors are uneven, fix them. If there are any pets around you, be aware of where they  are. Review your medicines with your doctor. Some medicines can make you feel dizzy. This can increase your chance of falling. Ask your doctor what other things that you can do to help prevent falls. This information is not intended to replace advice given to you by your health care provider. Make sure you discuss any questions you have with your health care provider. Document Released: 06/27/2009 Document Revised: 02/06/2016 Document Reviewed: 10/05/2014 Elsevier Interactive Patient Education  2017 [employer].

## 2022-04-01 ENCOUNTER — Ambulatory Visit: Payer: Medicare Other

## 2022-06-28 ENCOUNTER — Other Ambulatory Visit: Payer: Self-pay | Admitting: Family Medicine

## 2022-07-22 ENCOUNTER — Other Ambulatory Visit: Payer: Self-pay | Admitting: Family Medicine

## 2022-09-24 ENCOUNTER — Other Ambulatory Visit: Payer: Self-pay | Admitting: Family Medicine

## 2022-10-20 ENCOUNTER — Other Ambulatory Visit: Payer: Self-pay | Admitting: Family Medicine

## 2022-10-23 ENCOUNTER — Ambulatory Visit (INDEPENDENT_AMBULATORY_CARE_PROVIDER_SITE_OTHER): Payer: Medicare HMO | Admitting: Family Medicine

## 2022-10-23 ENCOUNTER — Encounter: Payer: Self-pay | Admitting: Family Medicine

## 2022-10-23 VITALS — BP 112/76 | HR 58 | Temp 97.7°F | Ht 71.25 in | Wt 195.0 lb

## 2022-10-23 DIAGNOSIS — E119 Type 2 diabetes mellitus without complications: Secondary | ICD-10-CM

## 2022-10-23 DIAGNOSIS — N1832 Chronic kidney disease, stage 3b: Secondary | ICD-10-CM

## 2022-10-23 DIAGNOSIS — I10 Essential (primary) hypertension: Secondary | ICD-10-CM

## 2022-10-23 DIAGNOSIS — N138 Other obstructive and reflux uropathy: Secondary | ICD-10-CM | POA: Diagnosis not present

## 2022-10-23 DIAGNOSIS — M159 Polyosteoarthritis, unspecified: Secondary | ICD-10-CM

## 2022-10-23 DIAGNOSIS — E782 Mixed hyperlipidemia: Secondary | ICD-10-CM

## 2022-10-23 DIAGNOSIS — N401 Enlarged prostate with lower urinary tract symptoms: Secondary | ICD-10-CM

## 2022-10-23 DIAGNOSIS — K432 Incisional hernia without obstruction or gangrene: Secondary | ICD-10-CM | POA: Diagnosis not present

## 2022-10-23 LAB — LIPID PANEL
Cholesterol: 103 mg/dL (ref 0–200)
HDL: 31.1 mg/dL — ABNORMAL LOW (ref >39.00)
LDL Cholesterol: 43 mg/dL (ref 0–99)
NonHDL: 71.51
Total CHOL/HDL Ratio: 3
Triglycerides: 141 mg/dL (ref 0.0–149.0)
VLDL: 28.2 mg/dL (ref 0.0–40.0)

## 2022-10-23 LAB — CBC WITH DIFFERENTIAL/PLATELET
Basophils Absolute: 0.1 10*3/uL (ref 0.0–0.1)
Basophils Relative: 0.9 % (ref 0.0–3.0)
Eosinophils Absolute: 0.1 10*3/uL (ref 0.0–0.7)
Eosinophils Relative: 1.5 % (ref 0.0–5.0)
HCT: 42.2 % (ref 39.0–52.0)
Hemoglobin: 14.5 g/dL (ref 13.0–17.0)
Lymphocytes Relative: 32.7 % (ref 12.0–46.0)
Lymphs Abs: 2.5 10*3/uL (ref 0.7–4.0)
MCHC: 34.3 g/dL (ref 30.0–36.0)
MCV: 91.2 fl (ref 78.0–100.0)
Monocytes Absolute: 0.6 10*3/uL (ref 0.1–1.0)
Monocytes Relative: 7.9 % (ref 3.0–12.0)
Neutro Abs: 4.3 10*3/uL (ref 1.4–7.7)
Neutrophils Relative %: 57 % (ref 43.0–77.0)
Platelets: 167 10*3/uL (ref 150.0–400.0)
RBC: 4.63 Mil/uL (ref 4.22–5.81)
RDW: 13.1 % (ref 11.5–15.5)
WBC: 7.6 10*3/uL (ref 4.0–10.5)

## 2022-10-23 LAB — HEPATIC FUNCTION PANEL
ALT: 24 U/L (ref 0–53)
AST: 19 U/L (ref 0–37)
Albumin: 4.2 g/dL (ref 3.5–5.2)
Alkaline Phosphatase: 46 U/L (ref 39–117)
Bilirubin, Direct: 0.2 mg/dL (ref 0.0–0.3)
Total Bilirubin: 0.7 mg/dL (ref 0.2–1.2)
Total Protein: 6.9 g/dL (ref 6.0–8.3)

## 2022-10-23 LAB — BASIC METABOLIC PANEL
BUN: 26 mg/dL — ABNORMAL HIGH (ref 6–23)
CO2: 22 mEq/L (ref 19–32)
Calcium: 9.1 mg/dL (ref 8.4–10.5)
Chloride: 108 mEq/L (ref 96–112)
Creatinine, Ser: 1.47 mg/dL (ref 0.40–1.50)
GFR: 44.32 mL/min — ABNORMAL LOW (ref >60.00)
Glucose, Bld: 97 mg/dL (ref 70–99)
Potassium: 3.9 mEq/L (ref 3.5–5.1)
Sodium: 144 mEq/L (ref 135–145)

## 2022-10-23 LAB — TSH: TSH: 4.08 u[IU]/mL (ref 0.35–5.50)

## 2022-10-23 LAB — PSA: PSA: 1.84 ng/mL (ref 0.10–4.00)

## 2022-10-23 LAB — HEMOGLOBIN A1C: Hgb A1c MFr Bld: 5.3 % (ref 4.6–6.5)

## 2022-10-23 NOTE — Addendum Note (Signed)
Addended by: Alysia Penna A on: 10/23/2022 04:07 PM   Modules accepted: Orders

## 2022-10-23 NOTE — Progress Notes (Signed)
Subjective:    Patient ID: Bobby Lopez, male    DOB: Jan 14, 1941, 82 y.o.   MRN: [id number]  HPI Here to follow up on issues. He feels well except for one concern. In 2015 he had a ventral incisional hernia repair, and about 4 weeks ago he began to notice a bulge to the side of the surgical scar, and he has had intermittent sharp pains in the are. His BM's are regular. No nausea or vomiting. His BP is stable. He watches his diet closely. His joint pains are stable, he takes Diclofenac when needed.    Review of Systems  Constitutional: Negative.   HENT: Negative.    Eyes: Negative.   Respiratory: Negative.    Cardiovascular: Negative.   Gastrointestinal:  Positive for abdominal pain.  Genitourinary: Negative.   Musculoskeletal:  Positive for arthralgias.  Skin: Negative.   Neurological: Negative.   Psychiatric/Behavioral: Negative.         Objective:   Physical Exam Constitutional:      General: He is not in acute distress.    Appearance: Normal appearance. He is well-developed. He is not diaphoretic.  HENT:     Head: Normocephalic and atraumatic.     Right Ear: External ear normal.     Left Ear: External ear normal.     Nose: Nose normal.     Mouth/Throat:     Pharynx: No oropharyngeal exudate.  Eyes:     General: No scleral icterus.       Right eye: No discharge.        Left eye: No discharge.     Conjunctiva/sclera: Conjunctivae normal.     Pupils: Pupils are equal, round, and reactive to light.  Neck:     Thyroid: No thyromegaly.     Vascular: No JVD.     Trachea: No tracheal deviation.  Cardiovascular:     Rate and Rhythm: Normal rate and regular rhythm.     Heart sounds: Normal heart sounds. No murmur heard.    No friction rub. No gallop.  Pulmonary:     Effort: Pulmonary effort is normal. No respiratory distress.     Breath sounds: Normal breath sounds. No wheezing or rales.  Chest:     Chest wall: No tenderness.  Abdominal:     General: Abdomen is  flat. Bowel sounds are normal. There is no distension.     Palpations: Abdomen is soft.     Tenderness: There is no abdominal tenderness. There is no guarding or rebound.     Comments: There is a vertical midline surgical scar, and just to the right of the middle of this is a 3 cm reducible non-tender hernia   Genitourinary:    Penis: Normal. No tenderness.      Testes: Normal.     Prostate: Normal.     Rectum: Normal. Guaiac result negative.  Musculoskeletal:        General: No tenderness. Normal range of motion.     Cervical back: Neck supple.  Lymphadenopathy:     Cervical: No cervical adenopathy.  Skin:    General: Skin is warm and dry.     Coloration: Skin is not pale.     Findings: No erythema or rash.  Neurological:     Mental Status: He is alert and oriented to person, place, and time.     Cranial Nerves: No cranial nerve deficit.     Motor: No abnormal muscle tone.     Coordination: Coordination  normal.     Deep Tendon Reflexes: Reflexes are normal and symmetric. Reflexes normal.  Psychiatric:        Behavior: Behavior normal.        Thought Content: Thought content normal.        Judgment: Judgment normal.           Assessment & Plan:  He has a recurrent incisional hernia, so we will refer him back to Surgery to evaluate. His HTN and DJD are stable. His BPH is stable. Get fasting labs for lipids, an A1c, etc. We spent a total of (34   ) minutes reviewing records and discussing these issues.  Alysia Penna, MD

## 2022-11-24 ENCOUNTER — Other Ambulatory Visit: Payer: Self-pay | Admitting: General Surgery

## 2022-11-24 DIAGNOSIS — R1013 Epigastric pain: Secondary | ICD-10-CM

## 2022-12-21 ENCOUNTER — Other Ambulatory Visit: Payer: Self-pay | Admitting: Family Medicine

## 2022-12-23 ENCOUNTER — Ambulatory Visit
Admission: RE | Admit: 2022-12-23 | Discharge: 2022-12-23 | Disposition: A | Discharge status: Home / Self Care | Payer: Medicare HMO | Source: Ambulatory Visit | Attending: General Surgery | Admitting: General Surgery

## 2022-12-23 DIAGNOSIS — R1013 Epigastric pain: Secondary | ICD-10-CM

## 2022-12-23 MED ORDER — IOPAMIDOL (ISOVUE-300) INJECTION 61%
80.0000 mL | Freq: Once | INTRAVENOUS | Status: AC | PRN
  Administered 2022-12-23: 80 mL via INTRAVENOUS

## 2023-01-18 ENCOUNTER — Other Ambulatory Visit: Payer: Self-pay | Admitting: Family Medicine

## 2023-02-22 ENCOUNTER — Telehealth (HOSPITAL_COMMUNITY): Payer: Self-pay | Admitting: *Deleted

## 2023-02-22 NOTE — Telephone Encounter (Signed)
Received fax from Dr Samson Frederic requesting temporary IVC filter placement by Dr Randie Heinz prior to R [facility] on 8/08. Will give to [facility]

## 2023-02-23 ENCOUNTER — Other Ambulatory Visit: Payer: Self-pay

## 2023-02-23 DIAGNOSIS — Z86718 Personal history of other venous thrombosis and embolism: Secondary | ICD-10-CM

## 2023-03-17 ENCOUNTER — Other Ambulatory Visit: Payer: Self-pay | Admitting: Family Medicine

## 2023-03-26 ENCOUNTER — Ambulatory Visit (INDEPENDENT_AMBULATORY_CARE_PROVIDER_SITE_OTHER): Payer: Medicare HMO

## 2023-03-26 VITALS — Ht 71.25 in | Wt 190.0 lb

## 2023-03-26 DIAGNOSIS — Z0000 Encounter for general adult medical examination without abnormal findings: Secondary | ICD-10-CM | POA: Diagnosis not present

## 2023-03-26 NOTE — Progress Notes (Signed)
Subjective:   Bobby Lopez is a 82 y.o. male who presents for Medicare Annual/Subsequent preventive examination.  Visit Complete: Virtual  I connected with  [name] on 03/26/23 by a audio enabled telemedicine application and verified that I am speaking with the correct person using two identifiers.  Patient Location: Home  Provider Location: Home Office  I discussed the limitations of evaluation and management by telemedicine. The patient expressed understanding and agreed to proceed.  Patient Medicare AWV questionnaire was completed by the patient on ; I have confirmed that all information answered by patient is correct and no changes since this date.  Review of Systems     Cardiac Risk Factors include: advanced age (>40men, >20 women);male gender;diabetes mellitus     Objective:    Today's Vitals   03/26/23 1351  Weight: 190 lb (86.2 kg)  Height: 5' 11.25" (1.81 m)   Body mass index is 26.31 kg/m.     03/26/2023    2:00 PM 03/20/2022    9:39 AM 10/10/2018    8:28 AM 10/06/2017    8:47 AM 03/10/2017    9:06 AM 07/20/2014    3:28 PM 07/13/2014   11:45 AM  Advanced Directives  Does Patient Have a Medical Advance Directive? No Yes Yes Yes Yes Yes Yes  Type of Furniture conservator/restorer;Living will Living will;Healthcare Power of [employer] Power of [place];Living will Living will Living will  Does patient want to make changes to medical advance directive?  No - Patient declined No - Patient declined      Copy of Healthcare Power of Attorney in Chart?  Yes - validated most recent copy scanned in chart (See row information) Yes - validated most recent copy scanned in chart (See row information)   Yes Yes  Would patient like information on creating a medical advance directive? No - Patient declined          Current Medications (verified) Outpatient Encounter Medications as of 03/26/2023  Medication Sig   amLODipine (NORVASC) 5 MG  tablet TAKE 1 TABLET BY MOUTH EVERY MORNING   atorvastatin (LIPITOR) 20 MG tablet TAKE ONE TABLET BY MOUTH EVERY EVENING   diclofenac (VOLTAREN) 75 MG EC tablet TAKE 1 TABLET BY MOUTH 2 TIMES A DAY AS NEEDED FOR PAIN   docusate sodium (COLACE) 100 MG capsule Take 1 capsule (100 mg total) by mouth 2 (two) times daily as needed (take to keep stool soft.). (Patient taking differently: Take 100 mg by mouth daily.)   glipiZIDE (GLUCOTROL) 5 MG tablet TAKE ONE TABLET BY MOUTH TWICE A DAY BEFORE A MEAL   loratadine (CLARITIN) 10 MG tablet Take 10 mg by mouth daily.   losartan-hydrochlorothiazide (HYZAAR) 50-12.5 MG tablet TAKE 1 TABLET BY MOUTH DAILY   metFORMIN (GLUCOPHAGE) 500 MG tablet TAKE 1 TABLET BY MOUTH TWICE A DAY WITH A MEAL   Multiple Vitamin (MULTIVITAMIN WITH MINERALS) TABS tablet Take 1 tablet by mouth daily. Centrum silver   Omega-3 Fatty Acids (FISH OIL PO) Take by mouth daily.   Probiotic Product (ALIGN PO) Take 1 tablet by mouth daily.    Facility-Administered Encounter Medications as of 03/26/2023  Medication   0.9 %  sodium chloride infusion    Allergies (verified) Ace inhibitors and Hydrocodone   History: Past Medical History:  Diagnosis Date   Arthritis    Back   Bladder stones    NEEDS TO HAVE SURGERY   BPH (benign prostatic hyperplasia)  Diabetes mellitus    type  II   DVT (deep venous thrombosis) (HCC)    Eczema    Hyperlipidemia    Hypertension    Lumbar herniated disc    Past Surgical History:  Procedure Laterality Date   CHOLECYSTECTOMY     COLONOSCOPY  03/24/2017   per Dr. Lavon Paganini, adenomatous polyps, no repeats due to age    CYSTOSCOPY W/ RETROGRADES Bilateral 07/20/2014   Procedure: Zettie Cooley;  Surgeon: Crist Fat, MD;  Location: [facility];  Service: Urology;  Laterality: Bilateral;   HOLMIUM LASER APPLICATION N/A 07/20/2014   Procedure: HOLMIUM LASER APPLICATION;  Surgeon: Crist Fat, MD;  Location: [facility];  Service: Urology;   Laterality: N/A;   INSERTION OF MESH N/A 10/19/2013   Procedure: INSERTION OF MESH;  Surgeon: Robyne Askew, MD;  Location: [facility];  Service: General;  Laterality: N/A;   LUMBAR LAMINECTOMY/DECOMPRESSION MICRODISCECTOMY  09/26/2012   Procedure: LUMBAR LAMINECTOMY/DECOMPRESSION MICRODISCECTOMY 1 LEVEL;  Surgeon: Mariam Dollar, MD;  Location: [facility];  Service: Neurosurgery;  Laterality: Left;  Lumbar Lamiectomy Extraforaminal Diskectomy Lumbar Three-Four Left   TRANSURETHRAL RESECTION OF PROSTATE N/A 07/20/2014   Procedure: TRANSURETHRAL RESECTION OF THE PROSTATE (TURP) WITH GYRUS;  Surgeon: Crist Fat, MD;  Location: [facility];  Service: Urology;  Laterality: N/A;   VENTRAL HERNIA REPAIR  10/19/2013   DR TOTH   VENTRAL HERNIA REPAIR N/A 10/19/2013   Procedure: LAPAROSCOPIC VENTRAL HERNIA;  Surgeon: Robyne Askew, MD;  Location: [facility] OR;  Service: General;  Laterality: N/A;   Family History  Problem Relation Age of Onset   Diabetes Mother    Hypertension Mother    Esophageal cancer Sister    Colon cancer Neg Hx    Rectal cancer Neg Hx    Stomach cancer Neg Hx    Social History   Socioeconomic History   Marital status: Married    Spouse name: Not on file   Number of children: Not on file   Years of education: Not on file   Highest education level: Not on file  Occupational History    Comment: retired Psychologist, occupational  Tobacco Use   Smoking status: Former    Current packs/day: 0.00    Types: Cigarettes    Quit date: 07/13/1973    Years since quitting: 49.7   Smokeless tobacco: Never   Tobacco comments:    quit > 46 years ago  Vaping Use   Vaping status: Not on file  Substance and Sexual Activity   Alcohol use: No    Alcohol/week: 0.0 standard drinks of alcohol   Drug use: No   Sexual activity: Not Currently  Other Topics Concern   Not on file  Social History Narrative   Retired Psychologist, 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   Lives with wife and dog who he has had for many years   Walks dog for  exercise, but walking ability limited due to back and hip pain   Social Determinants of Health   Financial Resource Strain: Low Risk  (03/26/2023)   Overall Financial Resource Strain (CARDIA)    Difficulty of Paying Living Expenses: Not hard at all  Food Insecurity: No Food Insecurity (03/26/2023)   Hunger Vital Sign    Worried About Running Out of Food in the Last Year: Never true    Ran Out of Food in the Last Year: Never true  Transportation Needs: No Transportation Needs (03/26/2023)   PRAPARE - Transportation    Lack  of Transportation (Medical): No    Lack of Transportation (Non-Medical): No  Physical Activity: Insufficiently Active (03/26/2023)   Exercise Vital Sign    Days of Exercise per Week: 5 days    Minutes of Exercise per Session: 20 min  Stress: No Stress Concern Present (03/26/2023)   [employer] of Occupational Health - Occupational Stress Questionnaire    Feeling of Stress : Not at all  Social Connections: Moderately Isolated (03/26/2023)   Social Connection and Isolation Panel [NHANES]    Frequency of Communication with Friends and Family: More than three times a week    Frequency of Social Gatherings with Friends and Family: More than three times a week    Attends Religious Services: Never    Database administrator or Organizations: No    Attends Engineer, structural: Never    Marital Status: Married    Tobacco Counseling Counseling given: Not Answered Tobacco comments: quit > 46 years ago   Clinical Intake:  Pre-visit preparation completed: No  Pain : No/denies pain     BMI - recorded: 26.31 Nutritional Status: BMI 25 -29 Overweight Nutritional Risks: None Diabetes: Yes CBG done?: No Did pt. bring in CBG monitor from home?: No  How often do you need to have someone help you when you read instructions, pamphlets, or other written materials from your doctor or pharmacy?: 1 - Never  Interpreter Needed?: No  Information entered by ::  Theresa Mulligan LPN   Activities of Daily Living    03/26/2023    1:58 PM  In your present state of health, do you have any difficulty performing the following activities:  Hearing? 1  Comment Wears hearing aids  Vision? 0  Difficulty concentrating or making decisions? 0  Walking or climbing stairs? 0  Dressing or bathing? 0  Doing errands, shopping? 0  Preparing Food and eating ? N  Using the Toilet? N  In the past six months, have you accidently leaked urine? N  Do you have problems with loss of bowel control? N  Managing your Medications? N  Managing your Finances? N  Housekeeping or managing your Housekeeping? N    Patient Care Team: Nelwyn Salisbury, MD as PCP - General Marlou Porch Earle Gell, MD as Attending Physician (Urology) Artis Delay, MD as Consulting Physician (Hematology and Oncology) Donalee Citrin, MD as Consulting Physician (Neurosurgery) Verner Chol, [facility] (Inactive) as Pharmacist (Pharmacist)  Indicate any recent Medical Services you may have received from other than [facility] providers in the past year (date may be approximate).     Assessment:   This is a routine wellness examination for [place].  Hearing/Vision screen Hearing Screening - Comments:: Wears hearing aids Vision Screening - Comments:: Wears rx glasses - up to date with routine eye exams with  Dr Elmer Picker   Dietary issues and exercise activities discussed:     Goals Addressed               This Visit's Progress     Patient Stated (pt-stated)        Continue walking daily as tolerated.       Depression Screen    03/26/2023    1:58 PM 10/23/2022    8:35 AM 03/20/2022    9:34 AM 11/17/2021    2:13 PM 03/19/2021   10:52 AM 10/10/2018    8:14 AM 10/06/2017    8:48 AM  PHQ 2/9 Scores  PHQ - 2 Score 0 0 0 0 0 2 0  PHQ- 9 Score  0  2  8     Fall Risk    03/26/2023    1:59 PM 10/23/2022    8:35 AM 03/20/2022    9:37 AM 11/17/2021    2:12 PM 10/22/2021    3:58 PM  Fall Risk   Falls in the past year?  1 1 1 1  0  Number falls in past yr: 0 0 0 0 0  Injury with Fall? 0 0 0 1 0  Comment Followed by medical atention  No injury. Followed by PCP Soreness on back/ abdomen   Risk for fall due to : No Fall Risks No Fall Risks No Fall Risks No Fall Risks No Fall Risks  Follow up Falls prevention discussed Falls evaluation completed       MEDICARE RISK AT HOME:  Medicare Risk at Home - 03/26/23 1404     Any stairs in or around the home? Yes    If so, are there any without handrails? No    Home free of loose throw rugs in walkways, pet beds, electrical cords, etc? Yes    Adequate lighting in your home to reduce risk of falls? Yes    Life alert? No    Use of a cane, walker or w/c? No    Grab bars in the bathroom? Yes    Shower chair or bench in shower? No    Elevated toilet seat or a handicapped toilet? No             TIMED UP AND GO:  Was the test performed?  No    Cognitive Function:    10/06/2017    8:53 AM  MMSE - Mini Mental State Exam  Not completed: --        03/26/2023    2:00 PM 03/20/2022    9:39 AM  6CIT Screen  What Year? 0 points 0 points  What month? 0 points 0 points  What time? 0 points 0 points  Count back from 20 0 points 0 points  Months in reverse 4 points 0 points  Repeat phrase 0 points 0 points  Total Score 4 points 0 points    Immunizations Immunization History  Administered Date(s) Administered   COVID-19, mRNA, vaccine(Comirnaty)12 years and older 06/24/2022   Fluad Quad(high Dose 65+) 07/04/2019, 07/01/2020   Influenza Split 06/20/2012, 06/01/2013   Influenza Whole 06/25/2008, 06/18/2009   Influenza, High Dose Seasonal PF 07/10/2015, 06/05/2016, 05/24/2018, 06/16/2022   Influenza,inj,quad, With Preservative 06/14/2017   Influenza-Unspecified 06/11/2014, 06/14/2017, 06/17/2021   Moderna Sars-Covid-2 Vaccination 09/25/2019, 10/23/2019, 05/03/2020, 12/23/2020   Pneumococcal Conjugate-13 10/01/2014   Pneumococcal Polysaccharide-23 07/30/2011    Rsv, Bivalent, Protein Subunit Rsvpref,pf Verdis Frederickson) 07/07/2022   Tdap 04/10/2016   Zoster Recombinant(Shingrix) 10/19/2018, 12/22/2018   Zoster, Live 09/11/2014    TDAP status: Up to date  Flu Vaccine status: Up to date  Pneumococcal vaccine status: Up to date  Covid-19 vaccine status: Completed vaccines  Qualifies for Shingles Vaccine? Yes   Zostavax completed Yes   Shingrix Completed?: Yes  Screening Tests Health Maintenance  Topic Date Due   Diabetic kidney evaluation - Urine ACR  03/10/2017   FOOT EXAM  10/11/2019   OPHTHALMOLOGY EXAM  09/18/2022   COVID-19 Vaccine (6 - 2023-24 season) 10/25/2022   INFLUENZA VACCINE  04/15/2023   HEMOGLOBIN A1C  04/23/2023   Diabetic kidney evaluation - eGFR measurement  10/24/2023   Medicare Annual Wellness (AWV)  03/25/2024   DTaP/Tdap/Td (2 - Td or  Tdap) 04/10/2026   Pneumonia Vaccine 30+ Years old  Completed   Zoster Vaccines- Shingrix  Completed   HPV VACCINES  Aged Out    Health Maintenance  Health Maintenance Due  Topic Date Due   Diabetic kidney evaluation - Urine ACR  03/10/2017   FOOT EXAM  10/11/2019   OPHTHALMOLOGY EXAM  09/18/2022   COVID-19 Vaccine (6 - 2023-24 season) 10/25/2022    Colorectal cancer screening: No longer required.   Lung Cancer Screening: (Low Dose CT Chest recommended if Age 22-80 years, 20 pack-year currently smoking OR have quit w/in 15years.) does not qualify.     Additional Screening:  Hepatitis C Screening: does not qualify; Completed   Vision Screening: Recommended annual ophthalmology exams for early detection of glaucoma and other disorders of the eye. Is the patient up to date with their annual eye exam?  Yes  Who is the provider or what is the name of the office in which the patient attends annual eye exams? Dr Elmer Picker If pt is not established with a provider, would they like to be referred to a provider to establish care? No .   Dental Screening: Recommended annual dental  exams for proper oral hygiene  Diabetic Foot Exam: Diabetic Foot Exam: Overdue, Pt has been advised about the importance in completing this exam. Pt is scheduled for diabetic foot exam on Followed by PCP.  Community Resource Referral / Chronic Care Management:  CRR required this visit?  No   CCM required this visit?  No     Plan:     I have personally reviewed and noted the following in the patient's chart:   Medical and social history Use of alcohol, tobacco or illicit drugs  Current medications and supplements including opioid prescriptions. Patient is not currently taking opioid prescriptions. Functional ability and status Nutritional status Physical activity Advanced directives List of other physicians Hospitalizations, surgeries, and ER visits in previous 12 months Vitals Screenings to include cognitive, depression, and falls Referrals and appointments  In addition, I have reviewed and discussed with patient certain preventive protocols, quality metrics, and best practice recommendations. A written personalized care plan for preventive services as well as general preventive health recommendations were provided to patient.     Tillie Rung, LPN   12/21/8117   After Visit Summary: (MyChart) Due to this being a telephonic visit, the after visit summary with patients personalized plan was offered to patient via MyChart   Nurse Notes: Patient due Diabetic kidney evaluation-Urine ACR

## 2023-03-26 NOTE — Patient Instructions (Addendum)
Bobby Lopez , Thank you for taking time to come for your Medicare Wellness Visit. I appreciate your ongoing commitment to your health goals. Please review the following plan we discussed and let me know if I can assist you in the future.   These are the goals we discussed:  Goals       Patient Stated      Continue your exercise       Patient Stated (pt-stated)      Continue walking daily as tolerated.      Patient Stated      None at this time       Pharmacy Care Plan      CARE PLAN ENTRY  Current Barriers:  Chronic Disease Management support, education, and care coordination needs related to Hypertension, Hyperlipidemia, Diabetes, and Osteoarthritis  Hypertension (high blood pressure) Current antihypertensive regimen:  Amlodipine 5mg , 1 tablet every morning  Hydrochlorothiazide 12.5mg , 1 capsule once daily  Losartan 50mg , 1 tablet once daily  Previous antihypertensives tried: benazepril (stopped due to cough) Last practice recorded BP readings:  BP Readings from Last 3 Encounters:  01/16/20 120/62  10/12/19 140/80  10/10/18 126/72  Current home BP readings: patient does not check at home   Hyperlipidemia (high cholesterol) Current antihyperlipidemic regimen: atorvastatin 20mg , 1 tablet every evening Previous antihyperlipidemic medications tried: none Most recent lipid panel:     Component Value Date/Time   CHOL 111 10/12/2019 0933   TRIG 155.0 (H) 10/12/2019 0933   HDL 36.60 (L) 10/12/2019 0933   CHOLHDL 3 10/12/2019 0933   VLDL 31.0 10/12/2019 0933   LDLCALC 43 10/12/2019 0933   Diabetes Lab Results  Component Value Date   HGBA1C 5.4 01/16/2020  Current antihyperglycemic regimen:  metformin 500mg , 1 tablet twice daily with a meal   glipizide 5mg , 1 tablet twice daily before a meal  Current meal patterns: patient stated decreasing soda intake as well as daily sugars  Pharmacist Clinical Goal(s):  Hypertension Over the next 180 days, patient will maintain  blood pressure; <140/90 Hyperlipidemia Over the next 180 days, patient will continue with diet and exercise modifications to lower ASCVD risk and maintain cholesterol levels at goal: Cholesterol goals: Total Cholesterol goal under 200, Triglycerides goal under 150, HDL goal above 40 (men) or above 50 (women), LDL goal under 100.  Diabetes Over the next the 3 months patient will return for follow-up visit with Dr. Clent Ridges for repeat A1c.  A1c goal: <7.0%  Interventions: Comprehensive medication review performed; medication list updated in electronic medical record.  Hypertension Discussed diet and exercise and affect on blood pressure.  DASH diet: following a diet emphasizing fruits and vegetables and low-fat dairy products along with whole grains, fish, poultry, and nuts. Reducing red meats and sugars.  Hyperlipidemia How to reduce cholesterol through diet/weight management and physical activity.    We discussed how a diet high in plant sterols (fruits/vegetables/nuts/whole grains/legumes) may reduce your cholesterol.  Encouraged increasing fiber to a daily intake of 10-25g/day  Diabetes Discussed importance of preventative diabetic health exams.  Patient Self Care Activities:  Patient verbalizes understanding of plan as described above, Self administers medications as prescribed, Calls pharmacy for medication refills, and Calls provider office for new concerns or questions Continue current medications as directed by providers.  Continue following up with primary care provider and/or specialists. Continue routine of healthy habits (diet/ exercise).  Please see past updates related to this goal by clicking on the "Past Updates" button in the selected goal  Stay healthy (pt-stated)        This is a list of the screening recommended for you and due dates:  Health Maintenance  Topic Date Due   Yearly kidney health urinalysis for diabetes  03/10/2017   Complete foot exam   10/11/2019    Eye exam for diabetics  09/18/2022   COVID-19 Vaccine (6 - 2023-24 season) 10/25/2022   Flu Shot  04/15/2023   Hemoglobin A1C  04/23/2023   Yearly kidney function blood test for diabetes  10/24/2023   Medicare Annual Wellness Visit  03/25/2024   DTaP/Tdap/Td vaccine (2 - Td or Tdap) 04/10/2026   Pneumonia Vaccine  Completed   Zoster (Shingles) Vaccine  Completed   HPV Vaccine  Aged Out    Advanced directives: Advance directive discussed with you today. Even though you declined this today, please call our office should you change your mind, and we can give you the proper paperwork for you to fill out.   Conditions/risks identified: None  Next appointment: Follow up in one year for your annual wellness visit.   Preventive Care 42 Years and Older, Male  Preventive care refers to lifestyle choices and visits with your health care provider that can promote health and wellness. What does preventive care include? A yearly physical exam. This is also called an annual well check. Dental exams once or twice a year. Routine eye exams. Ask your health care provider how often you should have your eyes checked. Personal lifestyle choices, including: Daily care of your teeth and gums. Regular physical activity. Eating a healthy diet. Avoiding tobacco and drug use. Limiting alcohol use. Practicing safe sex. Taking low doses of aspirin every day. Taking vitamin and mineral supplements as recommended by your health care provider. What happens during an annual well check? The services and screenings done by your health care provider during your annual well check will depend on your age, overall health, lifestyle risk factors, and family history of disease. Counseling  Your health care provider may ask you questions about your: Alcohol use. Tobacco use. Drug use. Emotional well-being. Home and relationship well-being. Sexual activity. Eating habits. History of falls. Memory and ability  to understand (cognition). Work and work Astronomer. Screening  You may have the following tests or measurements: Height, weight, and BMI. Blood pressure. Lipid and cholesterol levels. These may be checked every 5 years, or more frequently if you are over 54 years old. Skin check. Lung cancer screening. You may have this screening every year starting at age 81 if you have a 30-pack-year history of smoking and currently smoke or have quit within the past 15 years. Fecal occult blood test (FOBT) of the stool. You may have this test every year starting at age 2. Flexible sigmoidoscopy or colonoscopy. You may have a sigmoidoscopy every 5 years or a colonoscopy every 10 years starting at age 22. Prostate cancer screening. Recommendations will vary depending on your family history and other risks. Hepatitis C blood test. Hepatitis B blood test. Sexually transmitted disease (STD) testing. Diabetes screening. This is done by checking your blood sugar (glucose) after you have not eaten for a while (fasting). You may have this done every 1-3 years. Abdominal aortic aneurysm (AAA) screening. You may need this if you are a current or former smoker. Osteoporosis. You may be screened starting at age 56 if you are at high risk. Talk with your health care provider about your test results, treatment options, and if necessary, the need for more tests.  Vaccines  Your health care provider may recommend certain vaccines, such as: Influenza vaccine. This is recommended every year. Tetanus, diphtheria, and acellular pertussis (Tdap, Td) vaccine. You may need a Td booster every 10 years. Zoster vaccine. You may need this after age 76. Pneumococcal 13-valent conjugate (PCV13) vaccine. One dose is recommended after age 5. Pneumococcal polysaccharide (PPSV23) vaccine. One dose is recommended after age 77. Talk to your health care provider about which screenings and vaccines you need and how often you need  them. This information is not intended to replace advice given to you by your health care provider. Make sure you discuss any questions you have with your health care provider. Document Released: 09/27/2015 Document Revised: 05/20/2016 Document Reviewed: 07/02/2015 Elsevier Interactive Patient Education  2017 [employer].  Fall Prevention in the Home Falls can cause injuries. They can happen to people of all ages. There are many things you can do to make your home safe and to help prevent falls. What can I do on the outside of my home? Regularly fix the edges of walkways and driveways and fix any cracks. Remove anything that might make you trip as you walk through a door, such as a raised step or threshold. Trim any bushes or trees on the path to your home. Use bright outdoor lighting. Clear any walking paths of anything that might make someone trip, such as rocks or tools. Regularly check to see if handrails are loose or broken. Make sure that both sides of any steps have handrails. Any raised decks and porches should have guardrails on the edges. Have any leaves, snow, or ice cleared regularly. Use sand or salt on walking paths during winter. Clean up any spills in your garage right away. This includes oil or grease spills. What can I do in the bathroom? Use night lights. Install grab bars by the toilet and in the tub and shower. Do not use towel bars as grab bars. Use non-skid mats or decals in the tub or shower. If you need to sit down in the shower, use a plastic, non-slip stool. Keep the floor dry. Clean up any water that spills on the floor as soon as it happens. Remove soap buildup in the tub or shower regularly. Attach bath mats securely with double-sided non-slip rug tape. Do not have throw rugs and other things on the floor that can make you trip. What can I do in the bedroom? Use night lights. Make sure that you have a light by your bed that is easy to reach. Do not use  any sheets or blankets that are too big for your bed. They should not hang down onto the floor. Have a firm chair that has side arms. You can use this for support while you get dressed. Do not have throw rugs and other things on the floor that can make you trip. What can I do in the kitchen? Clean up any spills right away. Avoid walking on wet floors. Keep items that you use a lot in easy-to-reach places. If you need to reach something above you, use a strong step stool that has a grab bar. Keep electrical cords out of the way. Do not use floor polish or wax that makes floors slippery. If you must use wax, use non-skid floor wax. Do not have throw rugs and other things on the floor that can make you trip. What can I do with my stairs? Do not leave any items on the stairs. Make sure that  there are handrails on both sides of the stairs and use them. Fix handrails that are broken or loose. Make sure that handrails are as long as the stairways. Check any carpeting to make sure that it is firmly attached to the stairs. Fix any carpet that is loose or worn. Avoid having throw rugs at the top or bottom of the stairs. If you do have throw rugs, attach them to the floor with carpet tape. Make sure that you have a light switch at the top of the stairs and the bottom of the stairs. If you do not have them, ask someone to add them for you. What else can I do to help prevent falls? Wear shoes that: Do not have high heels. Have rubber bottoms. Are comfortable and fit you well. Are closed at the toe. Do not wear sandals. If you use a stepladder: Make sure that it is fully opened. Do not climb a closed stepladder. Make sure that both sides of the stepladder are locked into place. Ask someone to hold it for you, if possible. Clearly mark and make sure that you can see: Any grab bars or handrails. First and last steps. Where the edge of each step is. Use tools that help you move around (mobility aids)  if they are needed. These include: Canes. Walkers. Scooters. Crutches. Turn on the lights when you go into a dark area. Replace any light bulbs as soon as they burn out. Set up your furniture so you have a clear path. Avoid moving your furniture around. If any of your floors are uneven, fix them. If there are any pets around you, be aware of where they are. Review your medicines with your doctor. Some medicines can make you feel dizzy. This can increase your chance of falling. Ask your doctor what other things that you can do to help prevent falls. This information is not intended to replace advice given to you by your health care provider. Make sure you discuss any questions you have with your health care provider. Document Released: 06/27/2009 Document Revised: 02/06/2016 Document Reviewed: 10/05/2014 Elsevier Interactive Patient Education  2017 [employer].

## 2023-03-30 ENCOUNTER — Encounter: Payer: Self-pay | Admitting: Family Medicine

## 2023-03-30 ENCOUNTER — Ambulatory Visit: Payer: Medicare HMO | Admitting: Family Medicine

## 2023-03-30 VITALS — BP 110/60 | HR 64 | Temp 98.3°F | Wt 188.0 lb

## 2023-03-30 DIAGNOSIS — J069 Acute upper respiratory infection, unspecified: Secondary | ICD-10-CM

## 2023-03-30 DIAGNOSIS — R059 Cough, unspecified: Secondary | ICD-10-CM | POA: Diagnosis not present

## 2023-03-30 LAB — POC COVID19 BINAXNOW: SARS Coronavirus 2 Ag: NEGATIVE

## 2023-03-30 MED ORDER — TOBRAMYCIN-DEXAMETHASONE 0.3-0.1 % OP SUSP: 2.0000 [drp] | OPHTHALMIC | 0 refills | Status: DC

## 2023-03-30 MED ORDER — BENZONATATE 200 MG PO CAPS: 200.0000 mg | ORAL_CAPSULE | Freq: Four times a day (QID) | ORAL | 0 refills | Status: DC | PRN

## 2023-03-30 MED ORDER — METHYLPREDNISOLONE 4 MG PO TBPK: ORAL_TABLET | ORAL | 0 refills | Status: DC

## 2023-03-30 NOTE — Progress Notes (Signed)
   Subjective:    Patient ID: Bobby Lopez, male    DOB: 04-04-1941, 82 y.o.   MRN: [id number]  HPI Here for 3 days of stuffy head, PND, runny nose, and a dry cough. No fever or SOB. Then this morning he woke up with redness and crusting in the left eye. Of note his wife had the exact same symptoms 2 weeks ago. She went to an urgent care, and they diagnosed her with a viral infection. They gave her cough pills, a steroid pack, and eye drops. She now feels fine.    Review of Systems  Constitutional: Negative.   HENT:  Positive for congestion, postnasal drip and rhinorrhea. Negative for ear pain, sinus pain and sore throat.   Eyes:  Positive for discharge and redness.  Respiratory:  Positive for cough. Negative for shortness of breath and wheezing.        Objective:   Physical Exam Constitutional:      Appearance: Normal appearance.  HENT:     Right Ear: Tympanic membrane, ear canal and external ear normal.     Left Ear: Tympanic membrane, ear canal and external ear normal.     Nose: Nose normal.     Mouth/Throat:     Pharynx: Oropharynx is clear.  Eyes:     Pupils: Pupils are equal, round, and reactive to light.     Comments: Left conjunctiva is red, the left eye has yellow DC in the corners   Neurological:     Mental Status: He is alert.           Assessment & Plan:  Viral URI, likely due to an adenovirus. We will treat him with a Medrol dose pack, Benzonatate pills for the cough, and Tobradex eye drops.  Gershon Crane, MD

## 2023-04-05 ENCOUNTER — Ambulatory Visit (HOSPITAL_COMMUNITY)
Admission: RE | Admit: 2023-04-05 | Discharge: 2023-04-05 | Disposition: A | Discharge status: Home / Self Care | Payer: Medicare HMO | Attending: Vascular Surgery | Admitting: Vascular Surgery

## 2023-04-05 ENCOUNTER — Encounter (HOSPITAL_COMMUNITY)
Admission: RE | Disposition: A | Discharge status: Home / Self Care | Payer: Self-pay | Source: Home / Self Care | Attending: Vascular Surgery

## 2023-04-05 ENCOUNTER — Other Ambulatory Visit: Payer: Self-pay

## 2023-04-05 DIAGNOSIS — Z86718 Personal history of other venous thrombosis and embolism: Secondary | ICD-10-CM | POA: Diagnosis not present

## 2023-04-05 DIAGNOSIS — M25559 Pain in unspecified hip: Secondary | ICD-10-CM

## 2023-04-05 DIAGNOSIS — Z408 Encounter for other prophylactic surgery: Secondary | ICD-10-CM | POA: Diagnosis present

## 2023-04-05 HISTORY — PX: IVC FILTER INSERTION: CATH118245

## 2023-04-05 LAB — POCT I-STAT, CHEM 8
BUN: 32 mg/dL — ABNORMAL HIGH (ref 8–23)
Calcium, Ion: 1.26 mmol/L (ref 1.15–1.40)
Chloride: 106 mmol/L (ref 98–111)
Creatinine, Ser: 1.2 mg/dL (ref 0.61–1.24)
Glucose, Bld: 106 mg/dL — ABNORMAL HIGH (ref 70–99)
HCT: 40 % (ref 39.0–52.0)
Hemoglobin: 13.6 g/dL (ref 13.0–17.0)
Potassium: 3.8 mmol/L (ref 3.5–5.1)
Sodium: 142 mmol/L (ref 135–145)
TCO2: 27 mmol/L (ref 22–32)

## 2023-04-05 LAB — GLUCOSE, CAPILLARY: Glucose-Capillary: 98 mg/dL (ref 70–99)

## 2023-04-05 SURGERY — IVC FILTER INSERTION
Anesthesia: LOCAL

## 2023-04-05 MED ORDER — MIDAZOLAM HCL 2 MG/2ML IJ SOLN
INTRAMUSCULAR | Status: AC
  Filled 2023-04-05: qty 2

## 2023-04-05 MED ORDER — IODIXANOL 320 MG/ML IV SOLN
INTRAVENOUS | Status: DC | PRN
  Administered 2023-04-05: 5 mL

## 2023-04-05 MED ORDER — FENTANYL CITRATE (PF) 100 MCG/2ML IJ SOLN
INTRAMUSCULAR | Status: AC
  Filled 2023-04-05: qty 2

## 2023-04-05 MED ORDER — HEPARIN (PORCINE) IN NACL 1000-0.9 UT/500ML-% IV SOLN
INTRAVENOUS | Status: DC | PRN
  Administered 2023-04-05 (×2): 500 mL

## 2023-04-05 MED ORDER — LIDOCAINE HCL (PF) 1 % IJ SOLN
INTRAMUSCULAR | Status: AC
  Filled 2023-04-05: qty 30

## 2023-04-05 MED ORDER — FENTANYL CITRATE (PF) 100 MCG/2ML IJ SOLN
INTRAMUSCULAR | Status: DC | PRN
  Administered 2023-04-05: 50 ug via INTRAVENOUS

## 2023-04-05 MED ORDER — MIDAZOLAM HCL 2 MG/2ML IJ SOLN
INTRAMUSCULAR | Status: DC | PRN
  Administered 2023-04-05: 1 mg via INTRAVENOUS

## 2023-04-05 MED ORDER — LIDOCAINE HCL (PF) 1 % IJ SOLN
INTRAMUSCULAR | Status: DC | PRN
  Administered 2023-04-05: 10 mL

## 2023-04-05 MED ORDER — SODIUM CHLORIDE 0.9 % IV SOLN: INTRAVENOUS | Status: DC

## 2023-04-05 SURGICAL SUPPLY — 6 items
COVER DOME SNAP 22 D (MISCELLANEOUS) IMPLANT
FILTER CELECT-JUGULAR (Filter) IMPLANT
KIT MICROPUNCTURE NIT STIFF (SHEATH) IMPLANT
SET ATX-X65L (MISCELLANEOUS) IMPLANT
TRAY PV CATH (CUSTOM PROCEDURE TRAY) ×1 IMPLANT
WIRE BENTSON .035X145CM (WIRE) IMPLANT

## 2023-04-05 NOTE — H&P (Signed)
H+P  History of Present Illness: This is a 82 y.o. male multiple DVTs not currently on anticoagulation plan for total hip arthroplasty  Past Medical History:  Diagnosis Date   Arthritis    Back   Bladder stones    NEEDS TO HAVE SURGERY   BPH (benign prostatic hyperplasia)    Diabetes mellitus    type  II   DVT (deep venous thrombosis) (HCC)    Eczema    Hyperlipidemia    Hypertension    Lumbar herniated disc     Past Surgical History:  Procedure Laterality Date   CHOLECYSTECTOMY     COLONOSCOPY  03/24/2017   per Dr. Lavon Paganini, adenomatous polyps, no repeats due to age    CYSTOSCOPY W/ RETROGRADES Bilateral 07/20/2014   Procedure: Zettie Cooley;  Surgeon: Crist Fat, MD;  Location: [facility];  Service: Urology;  Laterality: Bilateral;   HOLMIUM LASER APPLICATION N/A 07/20/2014   Procedure: HOLMIUM LASER APPLICATION;  Surgeon: Crist Fat, MD;  Location: [facility];  Service: Urology;  Laterality: N/A;   INSERTION OF MESH N/A 10/19/2013   Procedure: INSERTION OF MESH;  Surgeon: Robyne Askew, MD;  Location: [facility];  Service: General;  Laterality: N/A;   LUMBAR LAMINECTOMY/DECOMPRESSION MICRODISCECTOMY  09/26/2012   Procedure: LUMBAR LAMINECTOMY/DECOMPRESSION MICRODISCECTOMY 1 LEVEL;  Surgeon: Mariam Dollar, MD;  Location: [facility];  Service: Neurosurgery;  Laterality: Left;  Lumbar Lamiectomy Extraforaminal Diskectomy Lumbar Three-Four Left   TRANSURETHRAL RESECTION OF PROSTATE N/A 07/20/2014   Procedure: TRANSURETHRAL RESECTION OF THE PROSTATE (TURP) WITH GYRUS;  Surgeon: Crist Fat, MD;  Location: [facility];  Service: Urology;  Laterality: N/A;   VENTRAL HERNIA REPAIR  10/19/2013   DR TOTH   VENTRAL HERNIA REPAIR N/A 10/19/2013   Procedure: LAPAROSCOPIC VENTRAL HERNIA;  Surgeon: Robyne Askew, MD;  Location: [facility] OR;  Service: General;  Laterality: N/A;    Allergies  Allergen Reactions   Ace Inhibitors Cough    cough   Hydrocodone Nausea And Vomiting     Prior to Admission medications   Medication Sig Start Date End Date Taking? Authorizing Provider  amLODipine (NORVASC) 5 MG tablet TAKE 1 TABLET BY MOUTH EVERY MORNING 01/18/23  Yes Nelwyn Salisbury, MD  atorvastatin (LIPITOR) 20 MG tablet TAKE ONE TABLET BY MOUTH EVERY EVENING Patient taking differently: Take 20 mg by mouth in the morning. 01/18/23  Yes Nelwyn Salisbury, MD  benzonatate (TESSALON) 200 MG capsule Take 1 capsule (200 mg total) by mouth every 6 (six) hours as needed for cough. 03/30/23  Yes Nelwyn Salisbury, MD  cetirizine (KLS ALLER-TEC) 10 MG tablet Take 10 mg by mouth daily.   Yes [provider]  docusate sodium (COLACE) 100 MG capsule Take 1 capsule (100 mg total) by mouth 2 (two) times daily as needed (take to keep stool soft.). Patient taking differently: Take 100 mg by mouth in the morning. 07/20/14  Yes Crist Fat, MD  glipiZIDE (GLUCOTROL) 5 MG tablet TAKE ONE TABLET BY MOUTH TWICE A DAY BEFORE A MEAL 07/24/22  Yes Nelwyn Salisbury, MD  losartan-hydrochlorothiazide [facility]) 50-12.5 MG tablet TAKE 1 TABLET BY MOUTH DAILY 03/17/23  Yes Nelwyn Salisbury, MD  Menthol, Topical Analgesic, (BIOFREEZE EX) Apply 1 Application topically as needed (pain/back pain.).   Yes [provider]  metFORMIN (GLUCOPHAGE) 500 MG tablet TAKE 1 TABLET BY MOUTH TWICE A DAY WITH A MEAL 01/18/23  Yes Nelwyn Salisbury, MD  methylPREDNISolone (MEDROL  DOSEPAK) 4 MG TBPK tablet As directed 03/30/23  Yes Nelwyn Salisbury, MD  Multiple Vitamin (MULTIVITAMIN WITH MINERALS) TABS tablet Take 1 tablet by mouth daily. Centrum silver   Yes [provider]  Omega-3 Fatty Acids (FISH OIL PO) Take 1 capsule by mouth daily.   Yes [provider]  Probiotic Product (ALIGN PO) Take 1 tablet by mouth in the morning.   Yes [provider]  tobramycin-dexamethasone Wallene Dales) ophthalmic solution Place 2 drops into the left eye every 4 (four) hours while awake. 03/30/23  Yes Nelwyn Salisbury, MD  Calcium Polycarbophil (FIBER-CAPS PO) Take 1 capsule by mouth in the morning.    [provider]    Social History   Socioeconomic History   Marital status: Married    Spouse name: Not on file   Number of children: Not on file   Years of education: Not on file   Highest education level: Not on file  Occupational History    Comment: retired Psychologist, occupational  Tobacco Use   Smoking status: Former    Current packs/day: 0.00    Types: Cigarettes    Quit date: 07/13/1973    Years since quitting: 49.7   Smokeless tobacco: Never   Tobacco comments:    quit > 46 years ago  Vaping Use   Vaping status: Not on file  Substance and Sexual Activity   Alcohol use: No    Alcohol/week: 0.0 standard drinks of alcohol   Drug use: No   Sexual activity: Not Currently  Other Topics Concern   Not on file  Social History Narrative   Retired Psychologist, 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   Lives with wife and dog who he has had for many years   Walks dog for exercise, but walking ability limited due to back and hip pain   Social Determinants of Health   Financial Resource Strain: Low Risk  (03/26/2023)   Overall Financial Resource Strain (CARDIA)    Difficulty of Paying Living Expenses: Not hard at all  Food Insecurity: No Food Insecurity (03/26/2023)   Hunger Vital Sign    Worried About Running Out of Food in the Last Year: Never true    Ran Out of Food in the Last Year: Never true  Transportation Needs: No Transportation Needs (03/26/2023)   PRAPARE - Administrator, Civil Service (Medical): No    Lack of Transportation (Non-Medical): No  Physical Activity: Insufficiently Active (03/26/2023)   Exercise Vital Sign    Days of Exercise per Week: 5 days    Minutes of Exercise per Session: 20 min  Stress: No Stress Concern Present (03/26/2023)   [employer] of Occupational Health - Occupational Stress Questionnaire    Feeling of Stress : Not at all  Social Connections: Moderately Isolated  (03/26/2023)   Social Connection and Isolation Panel [NHANES]    Frequency of Communication with Friends and Family: More than three times a week    Frequency of Social Gatherings with Friends and Family: More than three times a week    Attends Religious Services: Never    Database administrator or Organizations: No    Attends Banker Meetings: Never    Marital Status: Married  Catering manager Violence: Not At Risk (03/26/2023)   Humiliation, Afraid, Rape, and Kick questionnaire    Fear of Current or Ex-Partner: No    Emotionally Abused: No    Physically Abused: No    Sexually Abused: No  Family History  Problem Relation Age of Onset   Diabetes Mother    Hypertension Mother    Esophageal cancer Sister    Colon cancer Neg Hx    Rectal cancer Neg Hx    Stomach cancer Neg Hx     ROS: No complaints today  Physical Examination  Vitals:   04/05/23 0708  BP: 139/72  Pulse: (!) 59  Resp: 16  Temp: 98.4 F (36.9 C)  SpO2: 97%   Body mass index is 26.5 kg/m.  Awake alert and oriented Nonlabored respirations Bilateral extremities without edema  CBC    Component Value Date/Time   WBC 7.6 10/23/2022 0823   RBC 4.63 10/23/2022 0823   HGB 14.5 10/23/2022 0823   HGB 13.9 12/08/2013 0907   HCT 42.2 10/23/2022 0823   HCT 41.2 12/08/2013 0907   PLT 167.0 10/23/2022 0823   PLT 199 12/08/2013 0907   MCV 91.2 10/23/2022 0823   MCV 88.0 12/08/2013 0907   MCH 30.3 07/21/2014 0401   MCHC 34.3 10/23/2022 0823   RDW 13.1 10/23/2022 0823   RDW 13.8 12/08/2013 0907   LYMPHSABS 2.5 10/23/2022 0823   LYMPHSABS 2.2 12/08/2013 0907   MONOABS 0.6 10/23/2022 0823   MONOABS 0.6 12/08/2013 0907   EOSABS 0.1 10/23/2022 0823   EOSABS 0.1 12/08/2013 0907   BASOSABS 0.1 10/23/2022 0823   BASOSABS 0.0 12/08/2013 0907    BMET    Component Value Date/Time   NA 144 10/23/2022 0823   NA 142 12/08/2013 0908   K 3.9 10/23/2022 0823   K 3.6 12/08/2013 0908   CL 108  10/23/2022 0823   CO2 22 10/23/2022 0823   CO2 25 12/08/2013 0908   GLUCOSE 97 10/23/2022 0823   GLUCOSE 211 (H) 12/08/2013 0908   BUN 26 (H) 10/23/2022 0823   BUN 12.7 12/08/2013 0908   CREATININE 1.47 10/23/2022 0823   CREATININE 1.0 12/08/2013 0908   CALCIUM 9.1 10/23/2022 0823   CALCIUM 9.8 12/08/2013 0908   GFRNONAA 73 (L) 07/21/2014 0401   GFRAA 84 (L) 07/21/2014 0401    COAGS: Lab Results  Component Value Date   INR 2.60 05/04/2014   INR 2.6 05/04/2014   INR 2.20 04/20/2014   PROTIME 31.2 (H) 05/04/2014   PROTIME 26.4 (H) 04/20/2014   PROTIME 43.2 (H) 04/13/2014     Non-Invasive Vascular Imaging:  Venous flow:   +------------------+----------+-------------------------+---------+  Location         Overall   Flow properties          Comments   +------------------+----------+-------------------------+---------+  Left common       Patent    Phasic; spontaneous;     Resolved.  femoral                     compressible                        +------------------+----------+-------------------------+---------+  Left femoral      ThrombosedNoncompressible          ---------  +------------------+----------+-------------------------+---------+  Left profunda     Patent    Compressible             ---------  femoral                                                         +------------------+----------+-------------------------+---------+  Left popliteal    Patent    Phasic; spontaneous;     Resolved.                              compressible                        +------------------+----------+-------------------------+---------+  Left posterior    Patent    Compressible             ---------  tibial                                                          +------------------+----------+-------------------------+---------+  Left peroneal     Patent    Compressible             ---------   +------------------+----------+-------------------------+---------+  Left             Patent    Compressible             ---------  saphenofemoral                                                  junction                                                        +------------------+----------+-------------------------+---------+  Left greater      Patent    Compressible             ---------  saphenous                                                       +------------------+----------+-------------------------+---------+  Left lesser       Patent    Compressible             ---------  saphenous                                                       +------------------+----------+-------------------------+---------+  Right common      Patent    Phasic; spontaneous;     ---------  femoral                     compressible                        +------------------+----------+-------------------------+---------+   -------------------------------------------------------------------  Summary:   - Deep vein thrombosis remains in the left femoral vein.  - Previous thrombosis of the CVF and popliteal vein appear    resolved.  - No evidence of deep vein thrombosis of the right common femoral  vein.    ASSESSMENT/PLAN: This is a 82 y.o. male with a strong personal history of DVT not currently anticoagulated.  He is to have total hip arthroplasty and plan for IVC filter placement.   Maysun Meditz C. Randie Heinz, MD [facility] Office: [phone] Pager: [phone]

## 2023-04-05 NOTE — Op Note (Signed)
    Patient name: Bobby Lopez MRN: [id number] DOB: Feb 10, 1941 Sex: male  04/05/2023 Pre-operative Diagnosis: History of DVT Post-operative diagnosis:  Same Surgeon:  Luanna Salk. Randie Heinz, MD Procedure Performed: 1.  Ultrasound-guided cannulation right internal jugular vein 2.  IVC venogram 3.  Placement of Cook Celect infrarenal IVC filter 4.  Moderate sedation with fentanyl and Versed for 7 minutes   Indications: 82yo male with history of multiple DVTs now planned for total hip arthroplasty and we are planning placement of infrarenal IVC filter to protect from PE perioperatively.   Findings: The IVC measured less than 28 mm infrarenally and the filter was placed at the bottom of L2..  There is a slight rightward tilt of the filter at completion but the IVC also appears tilted and the hook is not in the wall of IVC              Procedure:  The patient was identified in the holding area and taken to room 8.  The patient was then placed supine on the table and prepped and draped in the usual sterile fashion.  A time out was called.  Ultrasound was used to evaluate the right internal jugular vein which was patent and compressible.  The area was anesthetized 1% lidocaine and cannulated with a micropuncture needle followed by wire and sheath.  An ultrasound image was saved to the permanent record.  We placed a Bentson wire into the IVC and then dilated the wire tract and placed the introducer sheath and performed IVC venogram.  The filter was then deployed at the bottom of L2.  The introducer sheath was removed and pressure was held until hemostasis was obtained.  He tolerated the procedure without any complication.     Contrast: 5cc   Maze Corniel C. Randie Heinz, MD [facility] [place] Office: [phone] Pager: [phone]

## 2023-04-05 NOTE — Progress Notes (Signed)
Sent message, via epic in basket, requesting orders in epic from surgeon.  

## 2023-04-06 ENCOUNTER — Encounter (HOSPITAL_COMMUNITY): Payer: Self-pay | Admitting: Vascular Surgery

## 2023-04-08 ENCOUNTER — Ambulatory Visit: Payer: Self-pay | Admitting: Student

## 2023-04-08 DIAGNOSIS — E119 Type 2 diabetes mellitus without complications: Secondary | ICD-10-CM

## 2023-04-08 NOTE — Progress Notes (Addendum)
COVID Vaccine received:  []  No [x]  Yes Date of any COVID positive Test in last 90 days:  None  PCP - Gershon Crane, MD   Clearance on chart Cardiologist - none  Chest x-ray - 10-12-2019  2v   Epic EKG - 04-05-2023  Epic Stress Test -  ECHO -  Cardiac Cath -   PCR screen: [x]  Ordered & Completed           []   No Order but Needs PROFEND           []   N/A for this surgery  Surgery Plan:  []  Ambulatory                            [x]  Outpatient in bed                            []  Admit  Anesthesia:    []  General  [x]  Spinal                           []   Choice []   MAC   Pacemaker / ICD device [x]  No []  Yes   Spinal Cord Stimulator:[x]  No []  Yes       History of Sleep Apnea? [x]  No []  Yes   CPAP used?- [x]  No []  Yes    Does the patient monitor blood sugar?          [x]  No []  Yes  []  N/A  Patient has: []  NO Hx DM   []  Pre-DM                 []  DM1  [x]   DM2 Does patient have a [employer] or Dexacom? []  No []  Yes   Fasting Blood Sugar Ranges-  Checks Blood Sugar ___0  times a day  Diabetic medications/ instructions:  Metformin 500mg  bid,  Hold DOS Glipizide  5mg  bid ac,  Day before: only take morning dose, No evening dose. DOS:   hold DOS  Blood Thinner / Instructions:None Aspirin Instructions:  None  ERAS Protocol Ordered: []  No  [x]  Yes PRE-SURGERY []  ENSURE  [x]  G2  Patient is to be NPO after: 04:30  Comments: Patient was given the 5 CHG shower / bath instructions for THA  surgery along with 2 bottles of the CHG soap. Patient will start this on:  Sunday  04-18-2023  All questions were asked and answered, Patient voiced understanding of this process.   Activity level: Patient is able to climb a flight of stairs without difficulty; [x]  No CP  but would have SOB.  Patient can / can not perform ADLs without assistance.   Anesthesia review: DM2, HTN, Hx multiple DVTs- had IVC filter placed 04-05-23 (preparation for Hip surgery), CKD3   Patient denies shortness of breath,  fever, cough and chest pain at PAT appointment.  Patient verbalized understanding and agreement to the Pre-Surgical Instructions that were given to them at this PAT appointment. Patient was also educated of the need to review these PAT instructions again prior to his surgery.I reviewed the appropriate phone numbers to call if they have any and questions or concerns.

## 2023-04-08 NOTE — Patient Instructions (Addendum)
SURGICAL WAITING ROOM VISITATION Patients having surgery or a procedure may have no more than 2 support people in the waiting area - these visitors may rotate in the visitor waiting room.   Due to an increase in RSV and influenza rates and associated hospitalizations, children ages 3 and under may not visit patients in [facility] hospitals. If the patient needs to stay at the hospital during part of their recovery, the visitor guidelines for inpatient rooms apply.  PRE-OP VISITATION  Pre-op nurse will coordinate an appropriate time for 1 support person to accompany the patient in pre-op.  This support person may not rotate.  This visitor will be contacted when the time is appropriate for the visitor to come back in the pre-op area.  Please refer to the [facility] website for the visitor guidelines for Inpatients (after your surgery is over and you are in a regular room).  You are not required to quarantine at this time prior to your surgery. However, you must do this: Hand Hygiene often Do NOT share personal items Notify your provider if you are in close contact with someone who has COVID or you develop fever 100.4 or greater, new onset of sneezing, cough, sore throat, shortness of breath or body aches.  If you test positive for Covid or have been in contact with anyone that has tested positive in the last 10 days please notify you surgeon.    Your procedure is scheduled on:  Thursday   04-22-2023  Report to [facility] Main Entrance: [address] [facility] entrance where the [employer] is available.   Report to admitting at:  0515   AM  Call this number if you have any questions or problems the morning of surgery [phone]  Do not eat food after Midnight the night prior to your surgery/procedure.  After Midnight you may have the following liquids until   04:30  AM  DAY OF SURGERY  Clear Liquid Diet Water Black Coffee (sugar ok, NO MILK/CREAM OR CREAMERS)  Tea (sugar ok, NO  MILK/CREAM OR CREAMERS) regular and decaf                             Plain Jell-O  with no fruit (NO RED)                                           Fruit ices (not with fruit pulp, NO RED)                                     Popsicles (NO RED)                                                                  Juice: NO CITRUS JUICES: only apple, WHITE grape, WHITE cranberry Sports drinks like Gatorade or Powerade (NO RED)                    The day of surgery:  Drink ONE (1) Pre-Surgery G2 at  04;30  AM the morning  of surgery. Drink in one sitting. Do not sip.  This drink was given to you during your hospital pre-op appointment visit. Nothing else to drink after completing the Pre-Surgery  G2 : No candy, chewing gum or throat lozenges.    FOLLOW ANY ADDITIONAL PRE OP INSTRUCTIONS YOU RECEIVED FROM YOUR SURGEON'S OFFICE!!!   Oral Hygiene is also important to reduce your risk of infection.        Remember - BRUSH YOUR TEETH THE MORNING OF SURGERY WITH YOUR REGULAR TOOTHPASTE  Do NOT smoke after Midnight the night before surgery.  Take ONLY these medicines the morning of surgery with A SIP OF WATER: amlodipine  Diabetic medications/ instructions:  Metformin 500mg  bid,  Hold day of surgery Glipizide  5mg  bid ac,  Day before: only take morning dose, No evening dose. DOS:   DO NOT  TAKE                  You may not have any metal on your body including  jewelry, and body piercing  Do not wear lotions, powders,  cologne, or deodorant  Men may shave face and neck.  Contacts, Hearing Aids, dentures or bridgework may not be worn into surgery. DENTURES WILL BE REMOVED PRIOR TO SURGERY PLEASE DO NOT APPLY "Poly grip" OR ADHESIVES!!!  You may bring a small overnight bag with you on the day of surgery, only pack items that are not valuable. [facility] IS NOT RESPONSIBLE   FOR VALUABLES THAT ARE LOST OR STOLEN.   Do not bring your home medications to the hospital. The Pharmacy will dispense  medications listed on your medication list to you during your admission in the Hospital.  Please read over the following fact sheets you were given: IF YOU HAVE QUESTIONS ABOUT YOUR PRE-OP INSTRUCTIONS, PLEASE CALL [phone].     Pre-operative 5 CHG Bath Instructions   You can play a key role in reducing the risk of infection after surgery. Your skin needs to be as free of germs as possible. You can reduce the number of germs on your skin by washing with CHG (chlorhexidine gluconate) soap before surgery. CHG is an antiseptic soap that kills germs and continues to kill germs even after washing.   DO NOT use if you have an allergy to chlorhexidine/CHG or antibacterial soaps. If your skin becomes reddened or irritated, stop using the CHG and notify one of our RNs at [phone]  Please shower with the CHG soap starting 4 days before surgery using the following schedule: START SHOWERS ON SUNDAY  April 18, 2023  Please keep in mind the following:  DO NOT shave, including legs and underarms, starting the day of your first shower.   You may shave your face at any point before/day of surgery.   Place clean sheets on your bed the day you start using CHG soap. Use a clean washcloth (not used since being washed) for each shower. DO NOT sleep with pets once you start using the CHG.   CHG Shower Instructions:  If you choose to wash your hair and private area, wash first with your normal shampoo/soap.  After you use shampoo/soap, rinse your hair and body thoroughly to remove shampoo/soap residue.  Turn the water OFF and apply about 3 tablespoons (45 ml) of CHG soap to a CLEAN washcloth.  Apply CHG soap ONLY FROM YOUR NECK DOWN TO YOUR TOES (washing for 3-5 minutes)  DO NOT use CHG soap on face, private areas, open  wounds, or sores.  Pay special attention to the area where your surgery is being performed.  If you are having back surgery, having someone wash your back for you may be helpful.  Wait 2 minutes after CHG soap is applied, then you may rinse off the CHG soap.  Pat dry with a clean towel  Put on clean clothes/pajamas   If you choose to wear lotion, please use ONLY the CHG-compatible lotions on the back of this paper.     Additional instructions for the day of surgery: DO NOT APPLY any lotions, deodorants, cologne, or perfumes.   Put on clean/comfortable clothes.  Brush your teeth.  Ask your nurse before applying any prescription medications to the skin.      CHG Compatible Lotions   Aveeno Moisturizing lotion  Cetaphil Moisturizing Cream  Cetaphil Moisturizing Lotion  Clairol Herbal Essence Moisturizing Lotion, Dry Skin  Clairol Herbal Essence Moisturizing Lotion, Extra Dry Skin  Clairol Herbal Essence Moisturizing Lotion, Normal Skin  Curel Age Defying Therapeutic Moisturizing Lotion with Alpha Hydroxy  Curel Extreme Care Body Lotion  Curel Soothing Hands Moisturizing Hand Lotion  Curel Therapeutic Moisturizing Cream, Fragrance-Free  Curel Therapeutic Moisturizing Lotion, Fragrance-Free  Curel Therapeutic Moisturizing Lotion, Original Formula  Eucerin Daily Replenishing Lotion  Eucerin Dry Skin Therapy Plus Alpha Hydroxy Crme  Eucerin Dry Skin Therapy Plus Alpha Hydroxy Lotion  Eucerin Original Crme  Eucerin Original Lotion  Eucerin Plus Crme Eucerin Plus Lotion  Eucerin TriLipid Replenishing Lotion  Keri Anti-Bacterial Hand Lotion  Keri Deep Conditioning Original Lotion Dry Skin Formula Softly Scented  Keri Deep Conditioning Original Lotion, Fragrance Free Sensitive Skin Formula  Keri Lotion Fast Absorbing Fragrance Free Sensitive Skin Formula  Keri Lotion Fast Absorbing Softly Scented Dry Skin Formula  Keri Original Lotion  Keri Skin Renewal Lotion Keri Silky Smooth  Lotion  Keri Silky Smooth Sensitive Skin Lotion  Nivea Body Creamy Conditioning Oil  Nivea Body Extra Enriched Lotion  Nivea Body Original Lotion  Nivea Body Sheer Moisturizing Lotion Nivea Crme  Nivea Skin Firming Lotion  NutraDerm 30 Skin Lotion  NutraDerm Skin Lotion  NutraDerm Therapeutic Skin Cream  NutraDerm Therapeutic Skin Lotion  ProShield Protective Hand Cream  Provon moisturizing lotion   FAILURE TO FOLLOW THESE INSTRUCTIONS MAY RESULT IN THE CANCELLATION OF YOUR SURGERY  PATIENT SIGNATURE_________________________________  NURSE SIGNATURE__________________________________  ________________________________________________________________________     Rogelia Mire    An incentive spirometer is a tool that can help keep your lungs clear and active. This tool measures how well you are filling your lungs with each breath. Taking long deep  breaths may help reverse or decrease the chance of developing breathing (pulmonary) problems (especially infection) following: A long period of time when you are unable to move or be active. BEFORE THE PROCEDURE  If the spirometer includes an indicator to show your best effort, your nurse or respiratory therapist will set it to a desired goal. If possible, sit up straight or lean slightly forward. Try not to slouch. Hold the incentive spirometer in an upright position. INSTRUCTIONS FOR USE  Sit on the edge of your bed if possible, or sit up as far as you can in bed or on a chair. Hold the incentive spirometer in an upright position. Breathe out normally. Place the mouthpiece in your mouth and seal your lips tightly around it. Breathe in slowly and as deeply as possible, raising the piston or the ball toward the top of the column. Hold your breath for 3-5 seconds or for as long as possible. Allow the piston or ball to fall to the bottom of the column. Remove the mouthpiece from your mouth and breathe out normally. Rest for a  few seconds and repeat Steps 1 through 7 at least 10 times every 1-2 hours when you are awake. Take your time and take a few normal breaths between deep breaths. The spirometer may include an indicator to show your best effort. Use the indicator as a goal to work toward during each repetition. After each set of 10 deep breaths, practice coughing to be sure your lungs are clear. If you have an incision (the cut made at the time of surgery), support your incision when coughing by placing a pillow or rolled up towels firmly against it. Once you are able to get out of bed, walk around indoors and cough well. You may stop using the incentive spirometer when instructed by your caregiver.  RISKS AND COMPLICATIONS Take your time so you do not get dizzy or light-headed. If you are in pain, you may need to take or ask for pain medication before doing incentive spirometry. It is harder to take a deep breath if you are having pain. AFTER USE Rest and breathe slowly and easily. It can be helpful to keep track of a log of your progress. Your caregiver can provide you with a simple table to help with this. If you are using the spirometer at home, follow these instructions: SEEK MEDICAL CARE IF:  You are having difficultly using the spirometer. You have trouble using the spirometer as often as instructed. Your pain medication is not giving enough relief while using the spirometer. You develop fever of 100.5 F (38.1 C) or higher.                                                                                                    SEEK IMMEDIATE MEDICAL CARE IF:  You cough up bloody sputum that had not been present before. You develop fever of 102 F (38.9 C) or greater. You develop worsening pain at or near the incision site. MAKE SURE YOU:  Understand these instructions. Will watch your condition. Will get help  right away if you are not doing well or get worse. Document Released: 01/11/2007 Document Revised:  11/23/2011 Document Reviewed: 03/14/2007 [facility] Patient Information 2014 [place], [place].     WHAT IS A BLOOD TRANSFUSION? Blood Transfusion Information  A transfusion is the replacement of blood or some of its parts. Blood is made up of multiple cells which provide different functions. Red blood cells carry oxygen and are used for blood loss replacement. White blood cells fight against infection. Platelets control bleeding. Plasma helps clot blood. Other blood products are available for specialized needs, such as hemophilia or other clotting disorders. BEFORE THE TRANSFUSION  Who gives blood for transfusions?  Healthy volunteers who are fully evaluated to make sure their blood is safe. This is blood bank blood. Transfusion therapy is the safest it has ever been in the practice of medicine. Before blood is taken from a donor, a complete history is taken to make sure that person has no history of diseases nor engages in risky social behavior (examples are intravenous drug use or sexual activity with multiple partners). The donor's travel history is screened to minimize risk of transmitting infections, such as malaria. The donated blood is tested for signs of infectious diseases, such as HIV and hepatitis. The blood is then tested to be sure it is compatible with you in order to minimize the chance of a transfusion reaction. If you or a relative donates blood, this is often done in anticipation of surgery and is not appropriate for emergency situations. It takes many days to process the donated blood. RISKS AND COMPLICATIONS Although transfusion therapy is very safe and saves many lives, the main dangers of transfusion include:  Getting an infectious disease. Developing a transfusion reaction. This is an allergic reaction to something in the blood you were given. Every precaution is taken to prevent this. The decision to have a blood transfusion has been considered carefully by your caregiver  before blood is given. Blood is not given unless the benefits outweigh the risks. AFTER THE TRANSFUSION Right after receiving a blood transfusion, you will usually feel much better and more energetic. This is especially true if your red blood cells have gotten low (anemic). The transfusion raises the level of the red blood cells which carry oxygen, and this usually causes an energy increase. The nurse administering the transfusion will monitor you carefully for complications. HOME CARE INSTRUCTIONS  No special instructions are needed after a transfusion. You may find your energy is better. Speak with your caregiver about any limitations on activity for underlying diseases you may have. SEEK MEDICAL CARE IF:  Your condition is not improving after your transfusion. You develop redness or irritation at the intravenous (IV) site. SEEK IMMEDIATE MEDICAL CARE IF:  Any of the following symptoms occur over the next 12 hours: Shaking chills. You have a temperature by mouth above 102 F (38.9 C), not controlled by medicine. Chest, back, or muscle pain. People around you feel you are not acting correctly or are confused. Shortness of breath or difficulty breathing. Dizziness and fainting. You get a rash or develop hives. You have a decrease in urine output. Your urine turns a dark color or changes to pink, red, or brown. Any of the following symptoms occur over the next 10 days: You have a temperature by mouth above 102 F (38.9 C), not controlled by medicine. Shortness of breath. Weakness after normal activity. The white part of the eye turns yellow (jaundice). You have a decrease in the amount  of urine or are urinating less often. Your urine turns a dark color or changes to pink, red, or brown. Document Released: 08/28/2000 Document Revised: 11/23/2011 Document Reviewed: 04/16/2008 [facility] Patient Information 2014 [place],  [place].  _______________________________________________________________________

## 2023-04-09 ENCOUNTER — Encounter (HOSPITAL_COMMUNITY)
Admission: RE | Admit: 2023-04-09 | Discharge: 2023-04-09 | Disposition: A | Discharge status: Home / Self Care | Payer: Medicare HMO | Source: Ambulatory Visit | Attending: Orthopedic Surgery | Admitting: Orthopedic Surgery

## 2023-04-09 ENCOUNTER — Encounter (HOSPITAL_COMMUNITY): Payer: Self-pay

## 2023-04-09 ENCOUNTER — Other Ambulatory Visit: Payer: Self-pay

## 2023-04-09 VITALS — BP 108/65 | HR 80 | Temp 98.2°F | Resp 16 | Ht 71.0 in | Wt 187.0 lb

## 2023-04-09 DIAGNOSIS — I10 Essential (primary) hypertension: Secondary | ICD-10-CM | POA: Diagnosis not present

## 2023-04-09 DIAGNOSIS — E119 Type 2 diabetes mellitus without complications: Secondary | ICD-10-CM | POA: Diagnosis not present

## 2023-04-09 DIAGNOSIS — Z01818 Encounter for other preprocedural examination: Secondary | ICD-10-CM

## 2023-04-09 DIAGNOSIS — Z01812 Encounter for preprocedural laboratory examination: Secondary | ICD-10-CM | POA: Insufficient documentation

## 2023-04-09 HISTORY — DX: Personal history of urinary calculi: Z87.442

## 2023-04-09 LAB — TYPE AND SCREEN
ABO/RH(D): A POS
Antibody Screen: NEGATIVE

## 2023-04-09 LAB — BASIC METABOLIC PANEL
Anion gap: 9 (ref 5–15)
BUN: 25 mg/dL — ABNORMAL HIGH (ref 8–23)
CO2: 24 mmol/L (ref 22–32)
Calcium: 9.2 mg/dL (ref 8.9–10.3)
Chloride: 106 mmol/L (ref 98–111)
Creatinine, Ser: 1.26 mg/dL — ABNORMAL HIGH (ref 0.61–1.24)
GFR, Estimated: 57 mL/min — ABNORMAL LOW (ref >60)
Glucose, Bld: 178 mg/dL — ABNORMAL HIGH (ref 70–99)
Potassium: 4 mmol/L (ref 3.5–5.1)
Sodium: 139 mmol/L (ref 135–145)

## 2023-04-09 LAB — CBC
HCT: 39.9 % (ref 39.0–52.0)
Hemoglobin: 13.1 g/dL (ref 13.0–17.0)
MCH: 30.4 pg (ref 26.0–34.0)
MCHC: 32.8 g/dL (ref 30.0–36.0)
MCV: 92.6 fL (ref 80.0–100.0)
Platelets: 181 10*3/uL (ref 150–400)
RBC: 4.31 MIL/uL (ref 4.22–5.81)
RDW: 12.4 % (ref 11.5–15.5)
WBC: 11.3 10*3/uL — ABNORMAL HIGH (ref 4.0–10.5)
nRBC: 0 % (ref 0.0–0.2)

## 2023-04-09 LAB — SURGICAL PCR SCREEN
MRSA, PCR: NEGATIVE
Staphylococcus aureus: NEGATIVE

## 2023-04-09 LAB — GLUCOSE, CAPILLARY: Glucose-Capillary: 171 mg/dL — ABNORMAL HIGH (ref 70–99)

## 2023-04-12 ENCOUNTER — Telehealth: Payer: Self-pay

## 2023-04-12 ENCOUNTER — Other Ambulatory Visit (HOSPITAL_COMMUNITY): Payer: Self-pay | Admitting: Physician Assistant

## 2023-04-12 DIAGNOSIS — R609 Edema, unspecified: Secondary | ICD-10-CM

## 2023-04-12 LAB — HM DIABETES EYE EXAM

## 2023-04-12 NOTE — Telephone Encounter (Signed)
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  The patient has no swelling or discoloration that they can see.  He cannot turn his head left or right or look up or down. She called the [facility] doctor on call on Saturday and was advised to apply heat.  Neither heat nor Tylenol are helping.  Spoke with Aggie Moats PA who advised the patient should be scheduled for an appointment with the PA on Wednesday.  Bobby Lopez was scheduled for a PA appointment on Tuesday, 7/30 at 9 am in a spot that was made available.  After consult with Dr. Lenell Antu, [facility] ordered a limited Rt uE venous ultrasound to be completed before his visit on Tuesday. Patient aware of both appointments and voiced understanding. JLA

## 2023-04-13 ENCOUNTER — Encounter: Payer: Self-pay | Admitting: Physician Assistant

## 2023-04-13 ENCOUNTER — Emergency Department (HOSPITAL_COMMUNITY): Payer: Medicare HMO

## 2023-04-13 ENCOUNTER — Ambulatory Visit (INDEPENDENT_AMBULATORY_CARE_PROVIDER_SITE_OTHER): Payer: Medicare HMO | Admitting: Physician Assistant

## 2023-04-13 ENCOUNTER — Other Ambulatory Visit: Payer: Self-pay

## 2023-04-13 ENCOUNTER — Emergency Department (HOSPITAL_COMMUNITY)
Admission: EM | Admit: 2023-04-13 | Discharge: 2023-04-13 | Disposition: A | Discharge status: Home / Self Care | Payer: Medicare HMO | Attending: Emergency Medicine | Admitting: Emergency Medicine

## 2023-04-13 ENCOUNTER — Ambulatory Visit (INDEPENDENT_AMBULATORY_CARE_PROVIDER_SITE_OTHER)
Admission: RE | Admit: 2023-04-13 | Discharge: 2023-04-13 | Disposition: A | Discharge status: Home / Self Care | Payer: Medicare HMO | Source: Ambulatory Visit | Attending: Vascular Surgery | Admitting: Vascular Surgery

## 2023-04-13 VITALS — BP 116/67 | HR 78 | Temp 98.3°F | Resp 18 | Ht 71.0 in | Wt 188.8 lb

## 2023-04-13 DIAGNOSIS — M436 Torticollis: Secondary | ICD-10-CM | POA: Diagnosis not present

## 2023-04-13 DIAGNOSIS — Z7984 Long term (current) use of oral hypoglycemic drugs: Secondary | ICD-10-CM | POA: Diagnosis not present

## 2023-04-13 DIAGNOSIS — R609 Edema, unspecified: Secondary | ICD-10-CM | POA: Insufficient documentation

## 2023-04-13 DIAGNOSIS — I82B21 Chronic embolism and thrombosis of right subclavian vein: Secondary | ICD-10-CM | POA: Diagnosis not present

## 2023-04-13 DIAGNOSIS — E119 Type 2 diabetes mellitus without complications: Secondary | ICD-10-CM | POA: Diagnosis not present

## 2023-04-13 DIAGNOSIS — M542 Cervicalgia: Secondary | ICD-10-CM

## 2023-04-13 LAB — CBC WITH DIFFERENTIAL/PLATELET
Abs Immature Granulocytes: 0.04 10*3/uL (ref 0.00–0.07)
Basophils Absolute: 0 10*3/uL (ref 0.0–0.1)
Basophils Relative: 0 %
Eosinophils Absolute: 0.1 10*3/uL (ref 0.0–0.5)
Eosinophils Relative: 1 %
HCT: 37.8 % — ABNORMAL LOW (ref 39.0–52.0)
Hemoglobin: 12.4 g/dL — ABNORMAL LOW (ref 13.0–17.0)
Immature Granulocytes: 0 %
Lymphocytes Relative: 18 %
Lymphs Abs: 2 10*3/uL (ref 0.7–4.0)
MCH: 30.3 pg (ref 26.0–34.0)
MCHC: 32.8 g/dL (ref 30.0–36.0)
MCV: 92.4 fL (ref 80.0–100.0)
Monocytes Absolute: 1.1 10*3/uL — ABNORMAL HIGH (ref 0.1–1.0)
Monocytes Relative: 9 %
Neutro Abs: 8.3 10*3/uL — ABNORMAL HIGH (ref 1.7–7.7)
Neutrophils Relative %: 72 %
Platelets: 155 10*3/uL (ref 150–400)
RBC: 4.09 MIL/uL — ABNORMAL LOW (ref 4.22–5.81)
RDW: 12.6 % (ref 11.5–15.5)
WBC: 11.6 10*3/uL — ABNORMAL HIGH (ref 4.0–10.5)
nRBC: 0 % (ref 0.0–0.2)

## 2023-04-13 LAB — BASIC METABOLIC PANEL
Anion gap: 8 (ref 5–15)
BUN: 24 mg/dL — ABNORMAL HIGH (ref 8–23)
CO2: 25 mmol/L (ref 22–32)
Calcium: 8.8 mg/dL — ABNORMAL LOW (ref 8.9–10.3)
Chloride: 104 mmol/L (ref 98–111)
Creatinine, Ser: 1.06 mg/dL (ref 0.61–1.24)
GFR, Estimated: >60 mL/min (ref >60)
Glucose, Bld: 144 mg/dL — ABNORMAL HIGH (ref 70–99)
Potassium: 3.6 mmol/L (ref 3.5–5.1)
Sodium: 137 mmol/L (ref 135–145)

## 2023-04-13 LAB — SEDIMENTATION RATE: Sed Rate: 21 mm/hr — ABNORMAL HIGH (ref 0–16)

## 2023-04-13 LAB — C-REACTIVE PROTEIN: CRP: 0.8 mg/dL (ref <1.0)

## 2023-04-13 MED ORDER — SODIUM CHLORIDE (PF) 0.9 % IJ SOLN
INTRAMUSCULAR | Status: AC
  Filled 2023-04-13: qty 50

## 2023-04-13 MED ORDER — PREDNISONE 10 MG PO TABS: 50.0000 mg | ORAL_TABLET | Freq: Every day | ORAL | 0 refills | Status: DC

## 2023-04-13 MED ORDER — DIAZEPAM 5 MG PO TABS
5.0000 mg | ORAL_TABLET | Freq: Once | ORAL | Status: AC
  Administered 2023-04-13: 5 mg via ORAL
  Filled 2023-04-13: qty 1

## 2023-04-13 MED ORDER — KETOROLAC TROMETHAMINE 30 MG/ML IJ SOLN
15.0000 mg | Freq: Once | INTRAMUSCULAR | Status: AC
  Administered 2023-04-13: 15 mg via INTRAVENOUS
  Filled 2023-04-13: qty 1

## 2023-04-13 MED ORDER — IOHEXOL 350 MG/ML SOLN
75.0000 mL | Freq: Once | INTRAVENOUS | Status: AC | PRN
  Administered 2023-04-13: 75 mL via INTRAVENOUS

## 2023-04-13 MED ORDER — IBUPROFEN 400 MG PO TABS: 400.0000 mg | ORAL_TABLET | Freq: Three times a day (TID) | ORAL | 0 refills | Status: DC | PRN

## 2023-04-13 MED ORDER — DEXAMETHASONE SODIUM PHOSPHATE 10 MG/ML IJ SOLN
10.0000 mg | Freq: Once | INTRAMUSCULAR | Status: AC
  Administered 2023-04-13: 10 mg via INTRAVENOUS
  Filled 2023-04-13: qty 1

## 2023-04-13 MED ORDER — DIAZEPAM 5 MG PO TABS: 5.0000 mg | ORAL_TABLET | Freq: Two times a day (BID) | ORAL | 0 refills | Status: DC

## 2023-04-13 MED ORDER — TIZANIDINE HCL 4 MG PO CAPS: 4.0000 mg | ORAL_CAPSULE | Freq: Three times a day (TID) | ORAL | 0 refills | Status: DC | PRN

## 2023-04-13 NOTE — ED Provider Notes (Signed)
[place] EMERGENCY DEPARTMENT AT [facility] Provider Note   CSN: [id number] Arrival date & time: 04/13/23  6387     History  Chief Complaint  Patient presents with   rule out meningitis post IVC filter    Bobby Lopez is a 82 y.o. male.  HPI    82 year old male comes in with chief complaint of neck stiffness.  She was sent to the emergency room by vascular service team for further evaluation and to evaluate for possible meningitis.  Patient has past medical history of diabetes.  Patient has history of multiple thromboses in the past postoperatively, he is supposed to get a hip replacement done and electively had IVC filter placed last Monday.  Patient states that he woke up 2 days later with discomfort in his neck.  Over the course of the last 5 days, his symptoms have progressed.  Now he has difficulty with any movement of the head and discomfort over the neck.  Patient denies any headaches, nausea, vomiting, fevers, generalized weakness, appetite issues.  There has not been any confusion, falls/trauma, chiropractic manipulation.  Patient also denies any balance issues, change in vision or blurry vision, fainting or near fainting.  Home Medications Prior to Admission medications   Medication Sig Start Date End Date Taking? Authorizing Provider  diazepam (VALIUM) 5 MG tablet Take 1 tablet (5 mg total) by mouth 2 (two) times daily. 04/13/23  Yes Derwood Kaplan, MD  ibuprofen (ADVIL) 400 MG tablet Take 1 tablet (400 mg total) by mouth every 8 (eight) hours as needed. 04/13/23  Yes Derwood Kaplan, MD  predniSONE (DELTASONE) 10 MG tablet Take 5 tablets (50 mg total) by mouth daily. 04/15/23  Yes Derwood Kaplan, MD  tiZANidine (ZANAFLEX) 4 MG capsule Take 1 capsule (4 mg total) by mouth 3 (three) times daily as needed for muscle spasms. 04/13/23  Yes Diamante Rubin, MD  amLODipine (NORVASC) 5 MG tablet TAKE 1 TABLET BY MOUTH EVERY MORNING 01/18/23   Nelwyn Salisbury, MD   atorvastatin (LIPITOR) 20 MG tablet TAKE ONE TABLET BY MOUTH EVERY EVENING Patient taking differently: Take 20 mg by mouth in the morning. 01/18/23   Nelwyn Salisbury, MD  benzonatate (TESSALON) 200 MG capsule Take 1 capsule (200 mg total) by mouth every 6 (six) hours as needed for cough. 03/30/23   Nelwyn Salisbury, MD  Calcium Polycarbophil (FIBER-CAPS PO) Take 1 capsule by mouth in the morning.    [provider]  cetirizine (KLS ALLER-TEC) 10 MG tablet Take 10 mg by mouth daily.    [provider]  docusate sodium (COLACE) 100 MG capsule Take 1 capsule (100 mg total) by mouth 2 (two) times daily as needed (take to keep stool soft.). Patient taking differently: Take 100 mg by mouth in the morning. 07/20/14   Crist Fat, MD  glipiZIDE (GLUCOTROL) 5 MG tablet TAKE ONE TABLET BY MOUTH TWICE A DAY BEFORE A MEAL 07/24/22   Nelwyn Salisbury, MD  losartan-hydrochlorothiazide [facility]) 50-12.5 MG tablet TAKE 1 TABLET BY MOUTH DAILY 03/17/23   Nelwyn Salisbury, MD  Menthol, Topical Analgesic, (BIOFREEZE EX) Apply 1 Application topically daily.    [provider]  metFORMIN (GLUCOPHAGE) 500 MG tablet TAKE 1 TABLET BY MOUTH TWICE A DAY WITH A MEAL 01/18/23   Nelwyn Salisbury, MD  methylPREDNISolone (MEDROL DOSEPAK) 4 MG TBPK tablet As directed 03/30/23   Nelwyn Salisbury, MD  Multiple Vitamin (MULTIVITAMIN WITH MINERALS) TABS tablet Take 1 tablet  by mouth daily. Centrum silver    [provider]  Omega-3 Fatty Acids (FISH OIL PO) Take 1 capsule by mouth daily.    [provider]  Probiotic Product (ALIGN PO) Take 1 tablet by mouth in the morning.    [provider]  tobramycin-dexamethasone [facility]) ophthalmic solution Place 2 drops into the left eye every 4 (four) hours while awake. 03/30/23   Nelwyn Salisbury, MD      Allergies    Ace inhibitors, Hydrocodone, and Wound dressing adhesive    Review of Systems   Review of Systems  All other systems reviewed and  are negative.   Physical Exam Updated Vital Signs BP 139/84   Pulse 84   Temp 98.1 F (36.7 C) (Oral)   Resp 16   Ht 5\' 11"  (1.803 m)   Wt 85.3 kg   SpO2 96%   BMI 26.22 kg/m  Physical Exam Vitals and nursing note reviewed.  Constitutional:      Appearance: He is well-developed.  HENT:     Head: Atraumatic.  Eyes:     Extraocular Movements: Extraocular movements intact.     Pupils: Pupils are equal, round, and reactive to light.  Cardiovascular:     Rate and Rhythm: Normal rate.  Pulmonary:     Effort: Pulmonary effort is normal.  Musculoskeletal:     Cervical back: Neck supple.     Comments: Patient has difficulty with neck range of motion beyond returning about 15 degrees either way.  Patient states that he is able to tolerate movement until he gets to the extremes -when he starts experiencing discomfort.  Flexion and extension is also limited.  Negative Kernig's and Brudzinski  Skin:    General: Skin is warm.     Findings: No rash.  Neurological:     Mental Status: He is alert and oriented to person, place, and time.     ED Results / Procedures / Treatments   Labs (all labs ordered are listed, but only abnormal results are displayed) Labs Reviewed  BASIC METABOLIC PANEL - Abnormal; Notable for the following components:      Result Value   Glucose, Bld 144 (*)    BUN 24 (*)    Calcium 8.8 (*)    All other components within normal limits  CBC WITH DIFFERENTIAL/PLATELET - Abnormal; Notable for the following components:   WBC 11.6 (*)    RBC 4.09 (*)    Hemoglobin 12.4 (*)    HCT 37.8 (*)    Neutro Abs 8.3 (*)    Monocytes Absolute 1.1 (*)    All other components within normal limits  SEDIMENTATION RATE - Abnormal; Notable for the following components:   Sed Rate 21 (*)    All other components within normal limits  C-REACTIVE PROTEIN    EKG None  Radiology CT ANGIO HEAD NECK W WO CM  Result Date: 04/13/2023 CLINICAL DATA:  headache/neck pain EXAM:  CT ANGIOGRAPHY HEAD AND NECK WITH AND WITHOUT CONTRAST TECHNIQUE: Multidetector CT imaging of the head and neck was performed using the standard protocol during bolus administration of intravenous contrast. Multiplanar CT image reconstructions and MIPs were obtained to evaluate the vascular anatomy. Carotid stenosis measurements (when applicable) are obtained utilizing NASCET criteria, using the distal internal carotid diameter as the denominator. RADIATION DOSE REDUCTION: This exam was performed according to the departmental dose-optimization program which includes automated exposure control, adjustment of the mA and/or kV according to patient size and/or use of  iterative reconstruction technique. CONTRAST:  75mL OMNIPAQUE IOHEXOL 350 MG/ML SOLN COMPARISON:  None Available. FINDINGS: CT HEAD FINDINGS Brain: No hemorrhage. No extra-axial fluid collection. No CT evidence of an acute cortical infarct. No hydrocephalus. Assessment of the cerebellum is markedly limited due to artifact 2. Vascular: No hyperdense vessel or unexpected calcification. Skull: Normal. Negative for fracture or focal lesion. Sinuses/Orbits: No middle ear or mastoid effusion. Paranasal sinuses are clear. Orbits are unremarkable. Other: None. Review of the MIP images confirms the above findings CTA NECK FINDINGS Aortic arch: Standard branching. Imaged portion shows no evidence of aneurysm or dissection. No significant stenosis of the major arch vessel origins. Right carotid system: No evidence of dissection, stenosis (50% or greater), or occlusion. Left carotid system: No evidence of dissection, stenosis (50% or greater), or occlusion. Vertebral arteries: Right-dominant. No evidence of dissection, stenosis (50% or greater), or occlusion. Skeleton: Negative. Other neck: Negative. Upper chest: Negative. Review of the MIP images confirms the above findings CTA HEAD FINDINGS Anterior circulation: No significant stenosis, proximal occlusion, aneurysm,  or vascular malformation. Posterior circulation: No significant stenosis, proximal occlusion, aneurysm, or vascular malformation. Venous sinuses: As permitted by contrast timing, patent. Anatomic variants: None Review of the MIP images confirms the above findings IMPRESSION: 1. No acute intracranial process. No specific etiology for headaches identified. 2. No intracranial large vessel occlusion or significant stenosis. 3. No hemodynamically significant stenosis in the neck. Electronically Signed   By: Lorenza Cambridge M.D.   On: 04/13/2023 12:36   VAS [place] UPPER EXTREMITY VENOUS DUPLEX  Result Date: 04/13/2023 UPPER VENOUS STUDY  Patient Name:  KASIE BOMGARDNER  Date of Exam:   04/13/2023 Medical Rec #: [id number]        Accession #:    [id number] Date of Birth: Oct 12, 1940         Patient Gender: M Patient Age:   46 years Exam Location:  [facility] Procedure:      VAS [place] UPPER EXTREMITY VENOUS DUPLEX Referring Phys: Emilie Rutter --------------------------------------------------------------------------------  Indications: Limited evaluation for right-sided neck pain s/p IVC filter placement via right IJV Comparison Study: No prior study Performing Technologist: Gertie Fey MHA, RDMS, RVT, RDCS  Examination Guidelines: A complete evaluation includes B-mode imaging, spectral Doppler, color Doppler, and power Doppler as needed of all accessible portions of each vessel. Bilateral testing is considered an integral part of a complete examination. Limited examinations for reoccurring indications may be performed as noted.  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  *See table(s) above for measurements and observations.  Diagnosing physician: Sherald Hess MD Electronically signed by Sherald Hess MD on 04/13/2023 at 8:59:51 AM.    Final     Procedures Procedures    Medications Ordered in ED Medications  diazepam (VALIUM) tablet 5 mg (5 mg Oral Given 04/13/23 1029)  iohexol (OMNIPAQUE) 350 MG/ML injection 75 mL (75 mLs Intravenous Contrast Given 04/13/23 1149)  ketorolac (TORADOL) 30 MG/ML injection 15 mg (15 mg Intravenous Given 04/13/23 1327)  dexamethasone (DECADRON) injection 10 mg (10 mg Intravenous Given 04/13/23 1327)    ED Course/ Medical Decision Making/ A&P                                 Medical Decision Making Amount and/or Complexity of Data Reviewed Labs: ordered. Radiology: ordered.  Risk Prescription drug management.  82 year old male comes in with chief complaint of neck pain.  He has history of hypercoagulability and thrombosis and underwent IVC filter placement prior to his elective hip replacement surgery last Monday.  Patient appears to be having neck pain for the last 4 to 5 days now.  Range of motion of the neck is limited because of the neck pain and stiffness.  However, patient denies any severe headaches, confusion, nausea, vision changes, fevers, chills, sweats, rashes.  He is not toxic-appearing and I doubt that this is acute bacterial meningitis -especially given that he is on day 5 of neck stiffness today.  Vital signs are stable and within normal limits, patient has no SIRS criteria.  I reviewed patient's ultrasound from today, he had a chronic right subclavian DVT.  It does not appear that it is the cause of his symptoms.  I discussed with the patient that our clinical suspicion for meningitis is  extremely low.  After significant deliberation, we agreed to getting basic labs and CT angiogram head and neck to make sure that there is no vascular injury.  He already had an ultrasound earlier today that was positive for subclavian DVT, which I do not think is contributing.  Differential diagnosis considered for this encounter does include meningitis, vertebral dissection, severe aneurysm, brain bleed.  As mentioned, pretest probability for all of these conditions is low.  Torticollis suspected to be more likely because, but patient is not on any psych medications.  Plan is to get a CT angiogram, give Valium and then reassess the patient.   1:46 PM I have independently interpreted CT angiogram.  There is no clear evidence of dissection. Patient does not have leukocytosis.  Sed rate is only slightly elevated at 21.  Results of the ER workup shared with the patient.  We took the opportunity to decide if patient wants me to proceed with LP or not.  Patient is comfortable now not proceeding with LP, in the setting of him not having any systemic symptoms, patient being nontoxic, not having high risk factors, and normal vital signs and white count.  Patient states that the Valium did help and he is moving his neck better.  Although he is still limited on my exam.  Therefore he has agreed to taking Valium with extreme precautions given his age and risk for falls.  We will give him a urinal.  I have put in a referral to neurology.  The hope would be that neurology can diagnose him with torticollis if it is indeed the case and also provide PT or botulinum injections.  Return precautions discussed with the patient and family.  They are comfortable with the plan.  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

## 2023-04-13 NOTE — Discharge Instructions (Addendum)
You are seen in the emergency for neck pain and stiffness. You are comfortable with the ED assessment that most likely this is not meningitis, and have opted not to proceed with lumbar puncture.  We would like you to continue with warm compresses to the neck aggressively. We would also like for you to continue through the stretching exercises frequently throughout the day. We would also like you to take the medications that are prescribed for symptom management.  Be very careful when taking Valium to prevent any falls.  If Valium is too strong for you, then switch to Zanaflex immediately.  I have placed a referral to neurology team so that they can see you and treat further.  You might need physical therapy or Botox injections based on neurology assessment.

## 2023-04-13 NOTE — Progress Notes (Signed)
POST OPERATIVE OFFICE NOTE    CC:  F/u for surgery  HPI:  This is a 82 y.o. male who is s/p Placement Cook infrarenal IVC filter  via right internal jugular on 04/05/2023 by Dr. Randie Heinz.  He called over the weekend with c/o a stiff neck and spoke to on call MD and was told to try heat and Tylenol but he did not get any relief.  He called yesterday and pt was scheduled for upper extremity venous duplex and see provider.   Pt returns today for follow up & here with his wife.  Pt states after his procedure on Monday 7/22, he was fine until Friday when he started having a little bit of stiffness in his neck.  He states that day he was still able to move his neck.  On Saturday, he and his wife woke up to go to breakfast and he was not able to move his neck.  He states that the pain is 10/10 and nothing has relieved his pain. He does describe headache as well.    Since he had the IVC filter on Monday, they called and asked to be seen.  On 03/30/2023, pt did take a steroid dose pack after having bronchitis.  His wife states that she had it and passed it to him.  He has not had any recent fever or chills or weakness.      Allergies  Allergen Reactions   Ace Inhibitors Cough   Hydrocodone Nausea And Vomiting   Wound Dressing Adhesive Rash    Some dressings break him out    Current Outpatient Medications  Medication Sig Dispense Refill   amLODipine (NORVASC) 5 MG tablet TAKE 1 TABLET BY MOUTH EVERY MORNING 90 tablet 0   atorvastatin (LIPITOR) 20 MG tablet TAKE ONE TABLET BY MOUTH EVERY EVENING (Patient taking differently: Take 20 mg by mouth in the morning.) 90 tablet 0   benzonatate (TESSALON) 200 MG capsule Take 1 capsule (200 mg total) by mouth every 6 (six) hours as needed for cough. 60 capsule 0   Calcium Polycarbophil (FIBER-CAPS PO) Take 1 capsule by mouth in the morning.     cetirizine (KLS ALLER-TEC) 10 MG tablet Take 10 mg by mouth daily.     docusate sodium (COLACE) 100 MG capsule Take 1  capsule (100 mg total) by mouth 2 (two) times daily as needed (take to keep stool soft.). (Patient taking differently: Take 100 mg by mouth in the morning.) 60 capsule 0   glipiZIDE (GLUCOTROL) 5 MG tablet TAKE ONE TABLET BY MOUTH TWICE A DAY BEFORE A MEAL 180 tablet 4   losartan-hydrochlorothiazide (HYZAAR) 50-12.5 MG tablet TAKE 1 TABLET BY MOUTH DAILY 90 tablet 0   Menthol, Topical Analgesic, (BIOFREEZE EX) Apply 1 Application topically daily.     metFORMIN (GLUCOPHAGE) 500 MG tablet TAKE 1 TABLET BY MOUTH TWICE A DAY WITH A MEAL 180 tablet 0   methylPREDNISolone (MEDROL DOSEPAK) 4 MG TBPK tablet As directed 21 tablet 0   Multiple Vitamin (MULTIVITAMIN WITH MINERALS) TABS tablet Take 1 tablet by mouth daily. Centrum silver     Omega-3 Fatty Acids (FISH OIL PO) Take 1 capsule by mouth daily.     Probiotic Product (ALIGN PO) Take 1 tablet by mouth in the morning.     tobramycin-dexamethasone (TOBRADEX) ophthalmic solution Place 2 drops into the left eye every 4 (four) hours while awake. 5 mL 0   Current Facility-Administered Medications  Medication Dose Route Frequency Provider Last Rate Last  Admin   0.9 %  sodium chloride infusion  500 mL Intravenous Continuous Nandigam, Kavitha V, MD         ROS:  See HPI  Physical Exam:  Today's Vitals   04/13/23 0828 04/13/23 0831  BP: 116/67   Pulse: 78   Resp: 18   Temp: 98.3 F (36.8 C)   TempSrc: Temporal   SpO2: 94%   Weight: 188 lb 12.8 oz (85.6 kg)   Height: 5\' 11"  (1.803 m)   PainSc: 10-Worst pain ever 10-Worst pain ever  PainLoc: Neck    Body mass index is 26.33 kg/m.   Incision:  right neck is soft without hematoma.   Head/neck:  extremely rigid and unable to move his head from side to side or up and down.  He is not tender to palpation around his neck on either side or posteriorly.   Extremities:  moving all extremities equally; he has palpable radial pulses bilaterally Neuro: in tact   RUE venous duplex 04/13/2023: Right  Findings:  +----------+------------+---------+-----------+----------+-------+  RIGHT    CompressiblePhasicitySpontaneousPropertiesSummary  +----------+------------+---------+-----------+----------+-------+  IJV          Full       Yes       Yes                       +----------+------------+---------+-----------+----------+-------+  Subclavian Partial      Yes       Yes              Chronic  +----------+------------+---------+-----------+----------+-------+  Summary:  Right:  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    Assessment/Plan:  This is a 82 y.o. male who is s/p: Placement Cook infrarenal IVC filter via right internal jugular 04/05/2023 by Dr. Randie Heinz  -pt venous duplex reveals a chronic DVT in the subclavian vein distally but no reason for his neck pain.   -pt seen with Dr. Lenell Antu and given the extreme rigidity of his neck and recent URI, recommend going to ER for evaluation as there is some concern for meningitis. Pt and his wife expressed understanding and going to the [employer] [facility].  I have called and spoken with the charge nurse aobut his arrival.     Doreatha Massed, [facility] [phone]   Clinic MD:  Lenell Antu

## 2023-04-13 NOTE — ED Triage Notes (Signed)
Pt reports inability to move his neck x4 days after having an IVC filter placed on 7/22. Denies injury. Denies other s/s.

## 2023-04-15 ENCOUNTER — Encounter: Payer: Self-pay | Admitting: Neurology

## 2023-04-15 ENCOUNTER — Ambulatory Visit (INDEPENDENT_AMBULATORY_CARE_PROVIDER_SITE_OTHER): Payer: Medicare HMO | Admitting: Neurology

## 2023-04-15 VITALS — BP 111/63 | HR 81 | Ht 71.0 in | Wt 192.5 lb

## 2023-04-15 DIAGNOSIS — M542 Cervicalgia: Secondary | ICD-10-CM | POA: Diagnosis not present

## 2023-04-15 DIAGNOSIS — M436 Torticollis: Secondary | ICD-10-CM

## 2023-04-15 NOTE — Progress Notes (Signed)
Chief Complaint  Patient presents with   Hospitalization Follow-up    Rm14, wife present (pat) ED referral for torticollis: limited neck movements      ASSESSMENT AND PLAN  Bobby Lopez is a 82 y.o. male   Neck pain, limited range of motion, Right internal jugular vein IVC filter placement on April 05, 2023,  Degenerative changes on CT scan cervical spine,  MRI of cervical spine to rule out structural abnormality, likely due to musculoskeletal etiology, suggested heating pad, as needed NSAIDs,  DIAGNOSTIC DATA (LABS, IMAGING, TESTING) - I reviewed patient records, labs, notes, testing and imaging myself where available.   MEDICAL HISTORY:  Bobby Lopez is a 82 year old male, seen in request by Dr. Clent Ridges, Tera Mater, MD for evaluation of difficulty moving his neck, neck pain, he is accompanied by his wife at today's visit April 15, 2023    I reviewed and summarized the referring note. PMHX Hx of DVT DM HTN HLD History of lumbar decompression surgery for left lumbar radiculopathy.  Patient reported due to previous episode of DVT following his operation, that was treated with 6 months of Coumadin, he is not on any anticoagulation treatment, he is planning on to have right hip replacement, this is known history of previous DVT, he underwent IVC filter placement by vascular surgeon Dr. Randie Heinz on April 05, 2023, I reviewed operation record, ultrasound-guided right internal jugular vein access, and placement of IVC filter  Initially he was doing well, on July 26 he had mild neck stiffness, neck pain, woke up July 27, noticed increased neck pain, difficulty moving his neck, he described difficulty moving his neck in any direction, then eventually presented to emergency room on April 13, 2023, he denies fever, or any other signs of infection, personally reviewed CT angiogram of head and neck April 13, 2023, there was no evidence of large vessel disease, evidence of cervical spine  degenerative changes  Laboratory evaluation showed normal C-reactive protein, elevated ESR 21, CBC with elevated WBC of 11.6,  He was given prednisone tapering dose, his symptoms gradually improved over the past few days, can move his neck better, but still with limitations, still has neck stiffness, pain  He denies headache, no visual change, no arm or leg weakness  PHYSICAL EXAM:   Vitals:   04/15/23 0858  BP: 111/63  Pulse: 81  Weight: 192 lb 8 oz (87.3 kg)  Height: 5\' 11"  (1.803 m)    Body mass index is 26.85 kg/m.  PHYSICAL EXAMNIATION:  Gen: NAD, conversant, well nourised, well groomed                     Cardiovascular: Regular rate rhythm, no peripheral edema, warm, nontender. Eyes: Conjunctivae clear without exudates or hemorrhage Neck: Supple, no carotid bruits. Pulmonary: Clear to auscultation bilaterally   NEUROLOGICAL EXAM:  MENTAL STATUS: Speech/cognition: Awake, alert, oriented to history taking and casual conversation CRANIAL NERVES: CN II: Visual fields are full to confrontation. Pupils are round equal and briskly reactive to light. CN III, IV, VI: extraocular movement are normal. No ptosis. CN V: Facial sensation is intact to light touch CN VII: Face is symmetric with normal eye closure  CN VIII: Hearing is normal to causal conversation. CN IX, X: Phonation is normal. CN XI: Head turning and shoulder shrug are intact  MOTOR: Mild limitation on neck movement, involving extension, flexion, especially turning to the left or right, complains of right-sided neck pain when turning his neck There  is no pronator drift of out-stretched arms. Muscle bulk and tone are normal. Muscle strength is normal.  REFLEXES: Reflexes are 2+ and symmetric at the biceps, triceps, knees, and ankles. Plantar responses are flexor.  SENSORY: Intact to light touch, pinprick and vibratory sensation are intact in fingers and toes.  COORDINATION: There is no trunk or limb  dysmetria noted.  GAIT/STANCE: Need push-up to get up from seated position, cautious  REVIEW OF SYSTEMS:  Full 14 system review of systems performed and notable only for as above All other review of systems were negative.   ALLERGIES: Allergies  Allergen Reactions   Ace Inhibitors Cough   Hydrocodone Nausea And Vomiting   Wound Dressing Adhesive Rash    Some dressings break him out    HOME MEDICATIONS: Current Outpatient Medications  Medication Sig Dispense Refill   amLODipine (NORVASC) 5 MG tablet TAKE 1 TABLET BY MOUTH EVERY MORNING 90 tablet 0   atorvastatin (LIPITOR) 20 MG tablet TAKE ONE TABLET BY MOUTH EVERY EVENING (Patient taking differently: Take 20 mg by mouth in the morning.) 90 tablet 0   benzonatate (TESSALON) 200 MG capsule Take 1 capsule (200 mg total) by mouth every 6 (six) hours as needed for cough. 60 capsule 0   Calcium Polycarbophil (FIBER-CAPS PO) Take 1 capsule by mouth in the morning.     cetirizine (KLS ALLER-TEC) 10 MG tablet Take 10 mg by mouth daily.     diazepam (VALIUM) 5 MG tablet Take 1 tablet (5 mg total) by mouth 2 (two) times daily. 6 tablet 0   docusate sodium (COLACE) 100 MG capsule Take 1 capsule (100 mg total) by mouth 2 (two) times daily as needed (take to keep stool soft.). (Patient taking differently: Take 100 mg by mouth in the morning.) 60 capsule 0   glipiZIDE (GLUCOTROL) 5 MG tablet TAKE ONE TABLET BY MOUTH TWICE A DAY BEFORE A MEAL 180 tablet 4   ibuprofen (ADVIL) 400 MG tablet Take 1 tablet (400 mg total) by mouth every 8 (eight) hours as needed. 15 tablet 0   losartan-hydrochlorothiazide (HYZAAR) 50-12.5 MG tablet TAKE 1 TABLET BY MOUTH DAILY 90 tablet 0   Menthol, Topical Analgesic, (BIOFREEZE EX) Apply 1 Application topically daily.     metFORMIN (GLUCOPHAGE) 500 MG tablet TAKE 1 TABLET BY MOUTH TWICE A DAY WITH A MEAL 180 tablet 0   methylPREDNISolone (MEDROL DOSEPAK) 4 MG TBPK tablet As directed 21 tablet 0   Multiple Vitamin  (MULTIVITAMIN WITH MINERALS) TABS tablet Take 1 tablet by mouth daily. Centrum silver     Omega-3 Fatty Acids (FISH OIL PO) Take 1 capsule by mouth daily.     predniSONE (DELTASONE) 10 MG tablet Take 5 tablets (50 mg total) by mouth daily. 15 tablet 0   Probiotic Product (ALIGN PO) Take 1 tablet by mouth in the morning.     tiZANidine (ZANAFLEX) 4 MG capsule Take 1 capsule (4 mg total) by mouth 3 (three) times daily as needed for muscle spasms. 15 capsule 0   Current Facility-Administered Medications  Medication Dose Route Frequency Provider Last Rate Last Admin   0.9 %  sodium chloride infusion  500 mL Intravenous Continuous Nandigam, Eleonore Chiquito, MD        PAST MEDICAL HISTORY: Past Medical History:  Diagnosis Date   Arthritis    Back   Bladder stones    NEEDS TO HAVE SURGERY   BPH (benign prostatic hyperplasia)    Diabetes mellitus    type  II  DVT (deep venous thrombosis) (HCC)    Eczema    History of kidney stones    Hyperlipidemia    Hypertension    Lumbar herniated disc     PAST SURGICAL HISTORY: Past Surgical History:  Procedure Laterality Date   CHOLECYSTECTOMY     COLONOSCOPY  03/24/2017   per Dr. Lavon Paganini, adenomatous polyps, no repeats due to age    CYSTOSCOPY W/ RETROGRADES Bilateral 07/20/2014   Procedure: Zettie Cooley;  Surgeon: Crist Fat, MD;  Location: [facility];  Service: Urology;  Laterality: Bilateral;   HOLMIUM LASER APPLICATION N/A 07/20/2014   Procedure: HOLMIUM LASER APPLICATION;  Surgeon: Crist Fat, MD;  Location: [facility];  Service: Urology;  Laterality: N/A;   INSERTION OF MESH N/A 10/19/2013   Procedure: INSERTION OF MESH;  Surgeon: Robyne Askew, MD;  Location: [facility] OR;  Service: General;  Laterality: N/A;   IVC FILTER INSERTION N/A 04/05/2023   Procedure: IVC FILTER INSERTION;  Surgeon: Maeola Harman, MD;  Location: [facility] INVASIVE CV LAB;  Service: Cardiovascular;  Laterality: N/A;   LUMBAR LAMINECTOMY/DECOMPRESSION  MICRODISCECTOMY  09/26/2012   Procedure: LUMBAR LAMINECTOMY/DECOMPRESSION MICRODISCECTOMY 1 LEVEL;  Surgeon: Mariam Dollar, MD;  Location: [facility];  Service: Neurosurgery;  Laterality: Left;  Lumbar Lamiectomy Extraforaminal Diskectomy Lumbar Three-Four Left   TRANSURETHRAL RESECTION OF PROSTATE N/A 07/20/2014   Procedure: TRANSURETHRAL RESECTION OF THE PROSTATE (TURP) WITH GYRUS;  Surgeon: Crist Fat, MD;  Location: [facility];  Service: Urology;  Laterality: N/A;   VENTRAL HERNIA REPAIR  10/19/2013   DR TOTH   VENTRAL HERNIA REPAIR N/A 10/19/2013   Procedure: LAPAROSCOPIC VENTRAL HERNIA;  Surgeon: Robyne Askew, MD;  Location: [facility] OR;  Service: General;  Laterality: N/A;    FAMILY HISTORY: Family History  Problem Relation Age of Onset   Diabetes Mother    Hypertension Mother    Esophageal cancer Sister    Colon cancer Neg Hx    Rectal cancer Neg Hx    Stomach cancer Neg Hx     SOCIAL HISTORY: Social History   Socioeconomic History   Marital status: Married    Spouse name: pat   Number of children: 1   Years of education: Not on file   Highest education level: Some college, no degree  Occupational History    Comment: retired Psychologist, occupational  Tobacco Use   Smoking status: Former    Current packs/day: 0.00    Types: Cigarettes    Quit date: 07/13/1973    Years since quitting: 49.7   Smokeless tobacco: Never   Tobacco comments:    quit > 46 years ago  Vaping Use   Vaping status: Never Used  Substance and Sexual Activity   Alcohol use: No    Alcohol/week: 0.0 standard drinks of alcohol   Drug use: No   Sexual activity: Not Currently  Other Topics Concern   Not on file  Social History Narrative   Retired Psychologist, 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   Lives with wife and dog who he has had for many years   Walks dog for exercise, but walking ability limited due to back and hip pain   Social Determinants of Health   Financial Resource Strain: Low Risk  (03/26/2023)   Overall Financial  Resource Strain (CARDIA)    Difficulty of Paying Living Expenses: Not hard at all  Food Insecurity: No Food Insecurity (03/26/2023)   Hunger Vital Sign    Worried About Programme researcher, broadcasting/film/video in  the Last Year: Never true    Ran Out of Food in the Last Year: Never true  Transportation Needs: No Transportation Needs (03/26/2023)   PRAPARE - Administrator, Civil Service (Medical): No    Lack of Transportation (Non-Medical): No  Physical Activity: Insufficiently Active (03/26/2023)   Exercise Vital Sign    Days of Exercise per Week: 5 days    Minutes of Exercise per Session: 20 min  Stress: No Stress Concern Present (03/26/2023)   [employer] of Occupational Health - Occupational Stress Questionnaire    Feeling of Stress : Not at all  Social Connections: Moderately Isolated (03/26/2023)   Social Connection and Isolation Panel [NHANES]    Frequency of Communication with Friends and Family: More than three times a week    Frequency of Social Gatherings with Friends and Family: More than three times a week    Attends Religious Services: Never    Database administrator or Organizations: No    Attends Banker Meetings: Never    Marital Status: Married  Catering manager Violence: Not At Risk (03/26/2023)   Humiliation, Afraid, Rape, and Kick questionnaire    Fear of Current or Ex-Partner: No    Emotionally Abused: No    Physically Abused: No    Sexually Abused: No      Levert Feinstein, M.D. Ph.D.  [facility] [address], [address] [place], [place] [address] Ph: [phone] Fax: [phone]  CC:  Derwood Kaplan, MD [address] [place],  [place] [address]  Nelwyn Salisbury, MD

## 2023-04-17 ENCOUNTER — Ambulatory Visit: Payer: Self-pay | Admitting: Student

## 2023-04-17 NOTE — H&P (Signed)
TOTAL HIP ADMISSION H&P  Patient is admitted for right total hip arthroplasty.  Subjective:  Chief Complaint: right hip pain  HPI: Bobby Lopez, 82 y.o. male, has a history of pain and functional disability in the right hip(s) due to arthritis and patient has failed non-surgical conservative treatments for greater than 12 weeks to include NSAID's and/or analgesics, corticosteriod injections, flexibility and strengthening excercises, use of assistive devices, and activity modification.  Onset of symptoms was gradual starting 10 years ago with rapidlly worsening course since that time.The patient noted no past surgery on the right hip(s).  Patient currently rates pain in the right hip at 10 out of 10 with activity. Patient has night pain, worsening of pain with activity and weight bearing, trendelenberg gait, pain that interfers with activities of daily living, and pain with passive range of motion. Patient has evidence of subchondral cysts, subchondral sclerosis, periarticular osteophytes, and joint space narrowing by imaging studies. This condition presents safety issues increasing the risk of falls.  There is no current active infection.  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   DJD (degenerative joint disease) 05/04/2014   Bladder stone 05/04/2014   Hematuria 11/13/2013   Postoperative ileus (HCC) 10/22/2013   Incisional hernia 01/01/2009   Diabetes mellitus without complication (HCC) 06/14/2007   Hyperlipidemia 06/14/2007   Essential hypertension 05/09/2007   Past Medical History:  Diagnosis Date   Arthritis    Back   Bladder stones    NEEDS TO HAVE SURGERY   BPH (benign prostatic hyperplasia)    Diabetes mellitus    type  II   DVT (deep venous thrombosis) (HCC)    Eczema    History of kidney stones    Hyperlipidemia    Hypertension    Lumbar herniated disc     Past  Surgical History:  Procedure Laterality Date   CHOLECYSTECTOMY     COLONOSCOPY  03/24/2017   per Dr. Lavon Paganini, adenomatous polyps, no repeats due to age    CYSTOSCOPY W/ RETROGRADES Bilateral 07/20/2014   Procedure: Zettie Cooley;  Surgeon: Crist Fat, MD;  Location: [facility];  Service: Urology;  Laterality: Bilateral;   HOLMIUM LASER APPLICATION N/A 07/20/2014   Procedure: HOLMIUM LASER APPLICATION;  Surgeon: Crist Fat, MD;  Location: [facility];  Service: Urology;  Laterality: N/A;   INSERTION OF MESH N/A 10/19/2013   Procedure: INSERTION OF MESH;  Surgeon: Robyne Askew, MD;  Location: [facility] OR;  Service: General;  Laterality: N/A;   IVC FILTER INSERTION N/A 04/05/2023   Procedure: IVC FILTER INSERTION;  Surgeon: Maeola Harman, MD;  Location: [facility] INVASIVE CV LAB;  Service: Cardiovascular;  Laterality: N/A;   LUMBAR LAMINECTOMY/DECOMPRESSION MICRODISCECTOMY  09/26/2012   Procedure: LUMBAR LAMINECTOMY/DECOMPRESSION MICRODISCECTOMY 1 LEVEL;  Surgeon: Mariam Dollar, MD;  Location: [facility];  Service: Neurosurgery;  Laterality: Left;  Lumbar Lamiectomy Extraforaminal Diskectomy Lumbar Three-Four Left   TRANSURETHRAL RESECTION OF PROSTATE N/A 07/20/2014   Procedure: TRANSURETHRAL RESECTION OF THE PROSTATE (TURP) WITH GYRUS;  Surgeon: Crist Fat, MD;  Location: [facility];  Service: Urology;  Laterality: N/A;   VENTRAL HERNIA REPAIR  10/19/2013   DR TOTH   VENTRAL HERNIA REPAIR N/A 10/19/2013   Procedure: LAPAROSCOPIC VENTRAL HERNIA;  Surgeon: Robyne Askew, MD;  Location: [facility] OR;  Service: General;  Laterality: N/A;    Current Outpatient Medications  Medication Sig Dispense Refill Last Dose   amLODipine (NORVASC) 5 MG tablet TAKE 1 TABLET BY MOUTH EVERY MORNING 90 tablet 0    atorvastatin (LIPITOR) 20 MG tablet TAKE ONE TABLET BY MOUTH EVERY EVENING (Patient taking differently: Take 20 mg by mouth in the morning.) 90 tablet 0    benzonatate (TESSALON) 200 MG capsule Take  1 capsule (200 mg total) by mouth every 6 (six) hours as needed for cough. 60 capsule 0    Calcium Polycarbophil (FIBER-CAPS PO) Take 1 capsule by mouth in the morning.      cetirizine (KLS ALLER-TEC) 10 MG tablet Take 10 mg by mouth daily.      diazepam (VALIUM) 5 MG tablet Take 1 tablet (5 mg total) by mouth 2 (two) times daily. 6 tablet 0    docusate sodium (COLACE) 100 MG capsule Take 1 capsule (100 mg total) by mouth 2 (two) times daily as needed (take to keep stool soft.). (Patient taking differently: Take 100 mg by mouth in the morning.) 60 capsule 0    glipiZIDE (GLUCOTROL) 5 MG tablet TAKE ONE TABLET BY MOUTH TWICE A DAY BEFORE A MEAL 180 tablet 4    ibuprofen (ADVIL) 400 MG tablet Take 1 tablet (400 mg total) by mouth every 8 (eight) hours as needed. 15 tablet 0    losartan-hydrochlorothiazide (HYZAAR) 50-12.5 MG tablet TAKE 1 TABLET BY MOUTH DAILY 90 tablet 0    Menthol, Topical Analgesic, (BIOFREEZE EX) Apply 1 Application topically daily.      metFORMIN (GLUCOPHAGE) 500 MG tablet TAKE 1 TABLET BY MOUTH TWICE A DAY WITH A MEAL 180 tablet 0    methylPREDNISolone (MEDROL DOSEPAK) 4 MG TBPK tablet As directed 21 tablet 0    Multiple Vitamin (MULTIVITAMIN WITH MINERALS) TABS tablet Take 1 tablet by mouth daily. Centrum silver      Omega-3 Fatty Acids (FISH OIL PO) Take 1 capsule by mouth daily.      predniSONE (DELTASONE) 10 MG tablet Take 5 tablets (50 mg total) by mouth daily. 15 tablet 0    Probiotic Product (ALIGN PO) Take 1 tablet by mouth in the morning.      tiZANidine (ZANAFLEX) 4 MG capsule Take 1 capsule (4 mg total) by mouth 3 (three) times daily as needed for muscle spasms. 15 capsule 0    Current Facility-Administered Medications  Medication Dose Route Frequency Provider Last Rate Last Admin   0.9 %  sodium chloride infusion  500 mL Intravenous Continuous Nandigam, Eleonore Chiquito, MD       Allergies  Allergen Reactions   Ace Inhibitors Cough   Hydrocodone Nausea And Vomiting    Wound Dressing Adhesive Rash    Some dressings break him out    Social History   Tobacco Use   Smoking status: Former    Current packs/day: 0.00    Types: Cigarettes    Quit date: 07/13/1973    Years since quitting: 49.7   Smokeless tobacco: Never   Tobacco comments:    quit > 46 years ago  Substance Use Topics   Alcohol use: No    Alcohol/week: 0.0 standard drinks of alcohol    Family History  Problem Relation Age of Onset   Diabetes Mother    Hypertension Mother    Esophageal cancer Sister    Colon cancer Neg Hx    Rectal cancer Neg Hx    Stomach cancer Neg Hx      Review of Systems  Musculoskeletal:  Positive for arthralgias  and gait problem.  All other systems reviewed and are negative.   Objective:  Physical Exam Constitutional:      Appearance: Normal appearance.  HENT:     Head: Normocephalic and atraumatic.     Nose: Nose normal.     Mouth/Throat:     Mouth: Mucous membranes are moist.     Pharynx: Oropharynx is clear.  Eyes:     Conjunctiva/sclera: Conjunctivae normal.  Cardiovascular:     Rate and Rhythm: Normal rate and regular rhythm.     Pulses: Normal pulses.     Heart sounds: Normal heart sounds.  Pulmonary:     Effort: Pulmonary effort is normal.     Breath sounds: Normal breath sounds.  Abdominal:     General: Abdomen is flat.     Palpations: Abdomen is soft.  Genitourinary:    Comments: deferred Musculoskeletal:     Cervical back: Normal range of motion and neck supple.     Comments: Examination of the right hip reveals no skin wounds or lesions. Pain with flexion and rotation of the hip. Mild trochanteric tenderness palpation. He does have restricted range of motion of the right hip. He has a 15 flexion contracture, and I can flex him up to 90. He internally rotates 5, externally rotates 20. Positive Stinchfield. He ambulates with a mildly antalgic gait.  Neurovascularly intact distally.  Skin:    General: Skin is warm and dry.      Capillary Refill: Capillary refill takes less than 2 seconds.  Neurological:     General: No focal deficit present.     Mental Status: He is alert and oriented to person, place, and time.  Psychiatric:        Mood and Affect: Mood normal.        Behavior: Behavior normal.        Thought Content: Thought content normal.        Judgment: Judgment normal.     Vital signs in last 24 hours: @VSRANGES @  Labs:   Estimated body mass index is 26.85 kg/m as calculated from the following:   Height as of 04/15/23: 5\' 11"  (1.803 m).   Weight as of 04/15/23: 87.3 kg.   Imaging Review Plain radiographs demonstrate severe degenerative joint disease of the right hip(s). The bone quality appears to be adequate for age and reported activity level.      Assessment/Plan:  End stage arthritis, right hip(s)  The patient history, physical examination, clinical judgement of the provider and imaging studies are consistent with end stage degenerative joint disease of the right hip(s) and total hip arthroplasty is deemed medically necessary. The treatment options including medical management, injection therapy, arthroscopy and arthroplasty were discussed at length. The risks and benefits of total hip arthroplasty were presented and reviewed. The risks due to aseptic loosening, infection, stiffness, dislocation/subluxation,  thromboembolic complications and other imponderables were discussed.  The patient acknowledged the explanation, agreed to proceed with the plan and consent was signed. Patient is being admitted for inpatient treatment for surgery, pain control, PT, OT, prophylactic antibiotics, VTE prophylaxis, progressive ambulation and ADL's and discharge planning.The patient is planning to be discharged home with HEP after an overnight stay.    Therapy Plans: HEP.  Disposition: Home with wife Planned DVT Prophylaxis: Eliquis 2.5mg  BID.  DME needed: walker.  PCP: Cleared. TXA: IV Allergies:  -  hydrocodone - facial swelling.  - oxycodone - vomiting, unsure.  - Adhesive allergy - rash.  - Seasonal allergies.  Anesthesia Concerns: None.  BMI: 26.3 Last HgbA1c: 5.3 Other: - IVC filter placement, last Monday with Dr. Randie Heinz, having neck pain and stiffness, appt tomorrow with Dr. Randie Heinz. Patient and wife will keep [place] updated.  - T2DM, glipizide, metformin - History of DVT x2 left leg - Tramadol, zofran.  - NO NSAIDs - 04/09/23: Hgb 13.1, K+ 4.0, Cr. 1.26.   Patient's anticipated LOS is less than 2 midnights, meeting these requirements: - Younger than 61 - Lives within 1 hour of care - Has a competent adult at home to recover with post-op recover - NO history of  - Chronic pain requiring opiods  - Diabetes  - Coronary Artery Disease  - Heart failure  - Heart attack  - Stroke  - DVT/VTE  - Cardiac arrhythmia  - Respiratory Failure/COPD  - Renal failure  - Anemia  - Advanced Liver disease

## 2023-04-18 ENCOUNTER — Other Ambulatory Visit: Payer: Self-pay | Admitting: Family Medicine

## 2023-04-19 ENCOUNTER — Telehealth: Payer: Self-pay | Admitting: Neurology

## 2023-04-19 NOTE — Telephone Encounter (Signed)
sent to GI they obtain [employer] medicare auth [id number] 

## 2023-04-22 ENCOUNTER — Encounter (HOSPITAL_COMMUNITY): Admission: RE | Payer: Self-pay | Source: Ambulatory Visit

## 2023-04-22 ENCOUNTER — Ambulatory Visit (HOSPITAL_COMMUNITY): Admission: RE | Admit: 2023-04-22 | Payer: Medicare HMO | Source: Ambulatory Visit | Admitting: Orthopedic Surgery

## 2023-04-22 SURGERY — ARTHROPLASTY, HIP, TOTAL, ANTERIOR APPROACH
Anesthesia: Spinal | Site: Hip | Laterality: Right

## 2023-05-09 ENCOUNTER — Other Ambulatory Visit: Payer: Medicare HMO

## 2023-05-20 ENCOUNTER — Telehealth: Payer: Self-pay

## 2023-05-20 NOTE — Telephone Encounter (Signed)
MRI was denied by insurance stating: Imaging requires six weeks of provider directed treatment to be completed. This must have been  completed in the past three months without improved symptoms.

## 2023-05-20 NOTE — Telephone Encounter (Signed)
Medical clearance letter faxed

## 2023-06-07 ENCOUNTER — Ambulatory Visit: Payer: Self-pay | Admitting: Student

## 2023-06-07 NOTE — Progress Notes (Signed)
Sent message, via epic in basket, requesting orders in epic from surgeon.  

## 2023-06-11 NOTE — Progress Notes (Signed)
Anesthesia Review:  PCP: Gershon Crane- clearance- 02/18/23 on chart  LOV 03/30/23  Cardiologist : Hematology- DR Terrace Arabia- LOV 04/25/23  Vasc- Samantha Rhyne,PAC LOV 04/13/2023  Chest x-ray : EKG : 04/05/23  Peripheral Vasc Cath- 04/05/23  Echo : Stress test: Cardiac Cath :  Activity level:  Sleep Study/ CPAP : Fasting Blood Sugar :      / Checks Blood Sugar -- times a day:   Blood Thinner/ Instructions /Last Dose: ASA / Instructions/ Last Dose :    DM- type  Hgba1c-  Glucotrol-  Metformin-    04/05/23- IVC filter

## 2023-06-11 NOTE — Patient Instructions (Addendum)
SURGICAL WAITING ROOM VISITATION  Patients having surgery or a procedure may have no more than 2 support people in the waiting area - these visitors may rotate.    Children under the age of 54 must have an adult with them who is not the patient.  Due to an increase in RSV and influenza rates and associated hospitalizations, children ages 75 and under may not visit patients in [facility] hospitals.  If the patient needs to stay at the hospital during part of their recovery, the visitor guidelines for inpatient rooms apply. Pre-op nurse will coordinate an appropriate time for 1 support person to accompany patient in pre-op.  This support person may not rotate.    Please refer to the [facility] website for the visitor guidelines for Inpatients (after your surgery is over and you are in a regular room).       Your procedure is scheduled on:  06/24/2023    Report to [facility] Main Entrance    Report to admitting at   0800AM   Call this number if you have problems the morning of surgery [phone]   Do not eat food :After Midnight.   After Midnight you may have the following liquids until ___ 0730___ am  DAY OF SURGERY  Water Non-Citrus Juices (without pulp, NO RED-Apple, White grape, White cranberry) Black Coffee (NO MILK/CREAM OR CREAMERS, sugar ok)  Clear Tea (NO MILK/CREAM OR CREAMERS, sugar ok) regular and decaf                             Plain Jell-O (NO RED)                                           Fruit ices (not with fruit pulp, NO RED)                                     Popsicles (NO RED)                                                               Sports drinks like Gatorade (NO RED)                    The day of surgery:  Drink ONE (1) Pre-Surgery Clear Ensure or G2 at  0730AM ( have completed by )  the morning of surgery. Drink in one sitting. Do not sip.  This drink was given to you during your hospital  pre-op appointment visit. Nothing else to  drink after completing the  Pre-Surgery Clear Ensure or G2.          If you have questions, please contact your surgeon's office.       Oral Hygiene is also important to reduce your risk of infection.                                    Remember - BRUSH YOUR TEETH THE MORNING OF SURGERY WITH  YOUR REGULAR TOOTHPASTE  DENTURES WILL BE REMOVED PRIOR TO SURGERY PLEASE DO NOT APPLY "Poly grip" OR ADHESIVES!!!   Do NOT smoke after Midnight   Stop all vitamins and herbal supplements 7 days before surgery.   Take these medicines the morning of surgery with A SIP OF WATER:  amlodipine             Glucotrol-              Metformin- none day of surgery.   DO NOT TAKE ANY ORAL DIABETIC MEDICATIONS DAY OF YOUR SURGERY  Bring CPAP mask and tubing day of surgery.                              You may not have any metal on your body including hair pins, jewelry, and body piercing             Do not wear make-up, lotions, powders, perfumes/cologne, or deodorant  Do not wear nail polish including gel and S&S, artificial/acrylic nails, or any other type of covering on natural nails including finger and toenails. If you have artificial nails, gel coating, etc. that needs to be removed by a nail salon please have this removed prior to surgery or surgery may need to be canceled/ delayed if the surgeon/ anesthesia feels like they are unable to be safely monitored.   Do not shave  48 hours prior to surgery.               Men may shave face and neck.   Do not bring valuables to the hospital. [facility] IS NOT             RESPONSIBLE   FOR VALUABLES.   Contacts, glasses, dentures or bridgework may not be worn into surgery.   Bring small overnight bag day of surgery.   DO NOT BRING YOUR HOME MEDICATIONS TO THE HOSPITAL. PHARMACY WILL DISPENSE MEDICATIONS LISTED ON YOUR MEDICATION LIST TO YOU DURING YOUR ADMISSION IN THE HOSPITAL!    Patients discharged on the day of surgery will not be allowed to  drive home.  Someone NEEDS to stay with you for the first 24 hours after anesthesia.   Special Instructions: Bring a copy of your healthcare power of attorney and living will documents the day of surgery if you haven't scanned them before.              Please read over the following fact sheets you were given: IF YOU HAVE QUESTIONS ABOUT YOUR PRE-OP INSTRUCTIONS PLEASE CALL [phone]   If you received a COVID test during your pre-op visit  it is requested that you wear a mask when out in public, stay away from anyone that may not be feeling well and notify your surgeon if you develop symptoms. If you test positive for Covid or have been in contact with anyone that has tested positive in the last 10 days please notify you surgeon.      Pre-operative 5 CHG Bath Instructions   You can play a key role in reducing the risk of infection after surgery. Your skin needs to be as free of germs as possible. You can reduce the number of germs on your skin by washing with CHG (chlorhexidine gluconate) soap before surgery. CHG is an antiseptic soap that kills germs and continues to kill germs even after washing.   DO NOT use if you have an allergy to chlorhexidine/CHG  or antibacterial soaps. If your skin becomes reddened or irritated, stop using the CHG and notify one of our RNs at [phone].   Please shower with the CHG soap starting 4 days before surgery using the following schedule:     Please keep in mind the following:  DO NOT shave, including legs and underarms, starting the day of your first shower.   You may shave your face at any point before/day of surgery.  Place clean sheets on your bed the day you start using CHG soap. Use a clean washcloth (not used since being washed) for each shower. DO NOT sleep with pets once you start using the CHG.   CHG Shower Instructions:  If you choose to wash your hair and private area, wash first with your normal shampoo/soap.  After you use  shampoo/soap, rinse your hair and body thoroughly to remove shampoo/soap residue.  Turn the water OFF and apply about 3 tablespoons (45 ml) of CHG soap to a CLEAN washcloth.  Apply CHG soap ONLY FROM YOUR NECK DOWN TO YOUR TOES (washing for 3-5 minutes)  DO NOT use CHG soap on face, private areas, open wounds, or sores.  Pay special attention to the area where your surgery is being performed.  If you are having back surgery, having someone wash your back for you may be helpful. Wait 2 minutes after CHG soap is applied, then you may rinse off the CHG soap.  Pat dry with a clean towel  Put on clean clothes/pajamas   If you choose to wear lotion, please use ONLY the CHG-compatible lotions on the back of this paper.     Additional instructions for the day of surgery: DO NOT APPLY any lotions, deodorants, cologne, or perfumes.   Put on clean/comfortable clothes.  Brush your teeth.  Ask your nurse before applying any prescription medications to the skin.      CHG Compatible Lotions   Aveeno Moisturizing lotion  Cetaphil Moisturizing Cream  Cetaphil Moisturizing Lotion  Clairol Herbal Essence Moisturizing Lotion, Dry Skin  Clairol Herbal Essence Moisturizing Lotion, Extra Dry Skin  Clairol Herbal Essence Moisturizing Lotion, Normal Skin  Curel Age Defying Therapeutic Moisturizing Lotion with Alpha Hydroxy  Curel Extreme Care Body Lotion  Curel Soothing Hands Moisturizing Hand Lotion  Curel Therapeutic Moisturizing Cream, Fragrance-Free  Curel Therapeutic Moisturizing Lotion, Fragrance-Free  Curel Therapeutic Moisturizing Lotion, Original Formula  Eucerin Daily Replenishing Lotion  Eucerin Dry Skin Therapy Plus Alpha Hydroxy Crme  Eucerin Dry Skin Therapy Plus Alpha Hydroxy Lotion  Eucerin Original Crme  Eucerin Original Lotion  Eucerin Plus Crme Eucerin Plus Lotion  Eucerin TriLipid Replenishing Lotion  Keri Anti-Bacterial Hand Lotion  Keri Deep Conditioning Original Lotion Dry  Skin Formula Softly Scented  Keri Deep Conditioning Original Lotion, Fragrance Free Sensitive Skin Formula  Keri Lotion Fast Absorbing Fragrance Free Sensitive Skin Formula  Keri Lotion Fast Absorbing Softly Scented Dry Skin Formula  Keri Original Lotion  Keri Skin Renewal Lotion Keri Silky Smooth Lotion  Keri Silky Smooth Sensitive Skin Lotion  Nivea Body Creamy Conditioning Oil  Nivea Body Extra Enriched Teacher, adult education Moisturizing Lotion Nivea Crme  Nivea Skin Firming Lotion  NutraDerm 30 Skin Lotion  NutraDerm Skin Lotion  NutraDerm Therapeutic Skin Cream  NutraDerm Therapeutic Skin Lotion  ProShield Protective Hand Cream  Provon moisturizing lotion

## 2023-06-13 ENCOUNTER — Other Ambulatory Visit: Payer: Self-pay | Admitting: Family Medicine

## 2023-06-14 ENCOUNTER — Encounter (HOSPITAL_COMMUNITY): Payer: Self-pay

## 2023-06-14 ENCOUNTER — Encounter (HOSPITAL_COMMUNITY)
Admission: RE | Admit: 2023-06-14 | Discharge: 2023-06-14 | Disposition: A | Discharge status: Home / Self Care | Payer: Medicare HMO | Source: Ambulatory Visit | Attending: Orthopedic Surgery

## 2023-06-14 ENCOUNTER — Other Ambulatory Visit: Payer: Self-pay

## 2023-06-14 VITALS — BP 109/58 | HR 83 | Temp 98.3°F | Resp 16 | Ht 72.0 in | Wt 183.0 lb

## 2023-06-14 DIAGNOSIS — Z01812 Encounter for preprocedural laboratory examination: Secondary | ICD-10-CM | POA: Insufficient documentation

## 2023-06-14 DIAGNOSIS — Z01818 Encounter for other preprocedural examination: Secondary | ICD-10-CM

## 2023-06-14 LAB — BASIC METABOLIC PANEL
Anion gap: 8 (ref 5–15)
BUN: 21 mg/dL (ref 8–23)
CO2: 21 mmol/L — ABNORMAL LOW (ref 22–32)
Calcium: 8.9 mg/dL (ref 8.9–10.3)
Chloride: 109 mmol/L (ref 98–111)
Creatinine, Ser: 1.26 mg/dL — ABNORMAL HIGH (ref 0.61–1.24)
GFR, Estimated: 57 mL/min — ABNORMAL LOW (ref >60)
Glucose, Bld: 158 mg/dL — ABNORMAL HIGH (ref 70–99)
Potassium: 3.6 mmol/L (ref 3.5–5.1)
Sodium: 138 mmol/L (ref 135–145)

## 2023-06-14 LAB — HEMOGLOBIN A1C
Hgb A1c MFr Bld: 5.5 % (ref 4.8–5.6)
Mean Plasma Glucose: 111.15 mg/dL

## 2023-06-14 LAB — CBC
HCT: 40.3 % (ref 39.0–52.0)
Hemoglobin: 13.4 g/dL (ref 13.0–17.0)
MCH: 31.2 pg (ref 26.0–34.0)
MCHC: 33.3 g/dL (ref 30.0–36.0)
MCV: 93.7 fL (ref 80.0–100.0)
Platelets: 163 10*3/uL (ref 150–400)
RBC: 4.3 MIL/uL (ref 4.22–5.81)
RDW: 13.2 % (ref 11.5–15.5)
WBC: 9 10*3/uL (ref 4.0–10.5)
nRBC: 0 % (ref 0.0–0.2)

## 2023-06-14 LAB — GLUCOSE, CAPILLARY: Glucose-Capillary: 157 mg/dL — ABNORMAL HIGH (ref 70–99)

## 2023-06-14 LAB — SURGICAL PCR SCREEN
MRSA, PCR: NEGATIVE
Staphylococcus aureus: NEGATIVE

## 2023-06-23 ENCOUNTER — Ambulatory Visit: Payer: Self-pay | Admitting: Student

## 2023-06-23 NOTE — Anesthesia Preprocedure Evaluation (Signed)
Anesthesia Evaluation  Patient identified by MRN, date of birth, ID band Patient awake    Reviewed: Allergy & Precautions, NPO status , Patient's Chart, lab work & pertinent test results  Airway Mallampati: II  TM Distance: >3 FB Neck ROM: Full    Dental no notable dental hx.    Pulmonary former smoker   Pulmonary exam normal        Cardiovascular hypertension, Pt. on medications + DVT  Normal cardiovascular exam     Neuro/Psych negative neurological ROS  negative psych ROS   GI/Hepatic negative GI ROS, Neg liver ROS,,,  Endo/Other  diabetes, Oral Hypoglycemic Agents    Renal/GU Renal InsufficiencyRenal disease     Musculoskeletal  (+) Arthritis ,    Abdominal   Peds  Hematology negative hematology ROS (+)   Anesthesia Other Findings  Right hip osteoarthritis  Reproductive/Obstetrics                              Anesthesia Physical Anesthesia Plan  ASA: 3  Anesthesia Plan: Spinal   Post-op Pain Management:    Induction: Intravenous  PONV Risk Score and Plan: 1 and Ondansetron, Dexamethasone, Propofol infusion and Treatment may vary due to age or medical condition  Airway Management Planned: Simple Face Mask  Additional Equipment:   Intra-op Plan:   Post-operative Plan:   Informed Consent: I have reviewed the patients History and Physical, chart, labs and discussed the procedure including the risks, benefits and alternatives for the proposed anesthesia with the patient or authorized representative who has indicated his/her understanding and acceptance.     Dental advisory given  Plan Discussed with: CRNA  Anesthesia Plan Comments:          Anesthesia Quick Evaluation

## 2023-06-23 NOTE — H&P (Signed)
TOTAL HIP ADMISSION H&P  Patient is admitted for right total hip arthroplasty.  Subjective:  Chief Complaint: right hip pain  HPI: Bobby Lopez, 82 y.o. male, has a history of pain and functional disability in the right hip(s) due to arthritis and patient has failed non-surgical conservative treatments for greater than 12 weeks to include NSAID's and/or analgesics, corticosteriod injections, flexibility and strengthening excercises, use of assistive devices, and activity modification.  Onset of symptoms was gradual starting 10 years ago with rapidlly worsening course since that time.The patient noted no past surgery on the right hip(s).  Patient currently rates pain in the right hip at 10 out of 10 with activity. Patient has night pain, worsening of pain with activity and weight bearing, trendelenberg gait, pain that interfers with activities of daily living, and pain with passive range of motion. Patient has evidence of subchondral cysts, subchondral sclerosis, periarticular osteophytes, and joint space narrowing by imaging studies. This condition presents safety issues increasing the risk of falls.  There is no current active infection.  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   DJD (degenerative joint disease) 05/04/2014   Bladder stone 05/04/2014   Hematuria 11/13/2013   Postoperative ileus (HCC) 10/22/2013   Incisional hernia 01/01/2009   Diabetes mellitus without complication (HCC) 06/14/2007   Hyperlipidemia 06/14/2007   Essential hypertension 05/09/2007   Past Medical History:  Diagnosis Date   Arthritis    Back   Bladder stones    NEEDS TO HAVE SURGERY   BPH (benign prostatic hyperplasia)    Diabetes mellitus    type  II   DVT (deep venous thrombosis) (HCC)    Eczema    History of kidney stones    Hyperlipidemia    Hypertension    Lumbar herniated disc     Past  Surgical History:  Procedure Laterality Date   CHOLECYSTECTOMY     COLONOSCOPY  03/24/2017   per Dr. Lavon Paganini, adenomatous polyps, no repeats due to age    CYSTOSCOPY W/ RETROGRADES Bilateral 07/20/2014   Procedure: Zettie Cooley;  Surgeon: Crist Fat, MD;  Location: [facility] ORS;  Service: Urology;  Laterality: Bilateral;   HOLMIUM LASER APPLICATION N/A 07/20/2014   Procedure: HOLMIUM LASER APPLICATION;  Surgeon: Crist Fat, MD;  Location: [facility] ORS;  Service: Urology;  Laterality: N/A;   INSERTION OF MESH N/A 10/19/2013   Procedure: INSERTION OF MESH;  Surgeon: Robyne Askew, MD;  Location: [facility] OR;  Service: General;  Laterality: N/A;   IVC FILTER INSERTION N/A 04/05/2023   Procedure: IVC FILTER INSERTION;  Surgeon: Maeola Harman, MD;  Location: [facility] INVASIVE CV LAB;  Service: Cardiovascular;  Laterality: N/A;   LUMBAR LAMINECTOMY/DECOMPRESSION MICRODISCECTOMY  09/26/2012   Procedure: LUMBAR LAMINECTOMY/DECOMPRESSION MICRODISCECTOMY 1 LEVEL;  Surgeon: Mariam Dollar, MD;  Location: [facility] NEURO ORS;  Service: Neurosurgery;  Laterality: Left;  Lumbar Lamiectomy Extraforaminal Diskectomy Lumbar Three-Four Left   TRANSURETHRAL RESECTION OF PROSTATE N/A 07/20/2014   Procedure: TRANSURETHRAL RESECTION OF THE PROSTATE (TURP) WITH GYRUS;  Surgeon: Crist Fat, MD;  Location: [facility] OR[facility];  Service: Urology;  Laterality: N/A;   VENTRAL HERNIA REPAIR  10/19/2013   DR TOTH   VENTRAL HERNIA REPAIR N/A 10/19/2013   Procedure: LAPAROSCOPIC VENTRAL HERNIA;  Surgeon: Robyne Askew, MD;  Location: [facility] OR;  Service: General;  Laterality: N/A;    Current Outpatient Medications  Medication Sig Dispense Refill Last Dose   amLODipine (NORVASC) 5 MG tablet TAKE 1 TABLET BY MOUTH EVERY MORNING 90 tablet 0    atorvastatin (LIPITOR) 20 MG tablet TAKE ONE TABLET BY MOUTH EVERY EVENING 90 tablet 0    benzonatate (TESSALON) 200 MG capsule Take 1 capsule (200 mg total) by mouth every 6 (six) hours as needed  for cough. (Patient not taking: Reported on 06/07/2023) 60 capsule 0    Calcium Polycarbophil (FIBER-CAPS PO) Take 1 capsule by mouth in the morning.      cetirizine (KLS ALLER-TEC) 10 MG tablet Take 10 mg by mouth daily.      diazepam (VALIUM) 5 MG tablet Take 1 tablet (5 mg total) by mouth 2 (two) times daily. (Patient not taking: Reported on 06/07/2023) 6 tablet 0    docusate sodium (COLACE) 100 MG capsule Take 1 capsule (100 mg total) by mouth 2 (two) times daily as needed (take to keep stool soft.). (Patient taking differently: Take 100 mg by mouth in the morning.) 60 capsule 0    glipiZIDE (GLUCOTROL) 5 MG tablet TAKE ONE TABLET BY MOUTH TWICE A DAY BEFORE A MEAL 180 tablet 4    ibuprofen (ADVIL) 400 MG tablet Take 1 tablet (400 mg total) by mouth every 8 (eight) hours as needed. (Patient not taking: Reported on 06/07/2023) 15 tablet 0    losartan-hydrochlorothiazide (HYZAAR) 50-12.5 MG tablet TAKE 1 TABLET BY MOUTH DAILY 90 tablet 0    Menthol, Topical Analgesic, (BIOFREEZE EX) Apply 1 Application topically daily.      metFORMIN (GLUCOPHAGE) 500 MG tablet TAKE 1 TABLET BY MOUTH TWICE A DAY WITH A MEAL 180 tablet 0    methylPREDNISolone (MEDROL DOSEPAK) 4 MG TBPK tablet As directed 21 tablet 0    Multiple Vitamin (MULTIVITAMIN WITH MINERALS) TABS tablet Take 1 tablet by mouth daily. Centrum silver      Omega-3 Fatty Acids (FISH OIL PO) Take 1 capsule by mouth daily.      predniSONE (DELTASONE) 10 MG tablet Take 5 tablets (50 mg total) by mouth daily. (Patient not taking: Reported on 06/07/2023) 15 tablet 0    Probiotic Product (ALIGN PO) Take 1 tablet by mouth in the morning.      tiZANidine (ZANAFLEX) 4 MG capsule Take 1 capsule (4 mg total) by mouth 3 (three) times daily as needed for muscle spasms. (Patient not taking: Reported on 06/07/2023) 15 capsule 0    Current Facility-Administered Medications  Medication Dose Route Frequency Provider Last Rate Last Admin   0.9 %  sodium chloride  infusion  500 mL Intravenous Continuous Nandigam, Kavitha V, MD       Allergies  Allergen Reactions   Ace Inhibitors Cough   Hydrocodone Nausea And Vomiting   Wound Dressing Adhesive Rash    Some dressings break him out    Social History   Tobacco Use   Smoking status: Former    Current packs/day: 0.00    Types: Cigarettes    Quit date: 07/13/1973    Years since quitting: 49.9   Smokeless tobacco: Never   Tobacco comments:    quit > 46 years ago  Substance Use Topics   Alcohol use: No    Alcohol/week: 0.0 standard drinks of alcohol    Family History  Problem Relation Age of Onset   Diabetes Mother    Hypertension Mother    Esophageal cancer Sister    Colon cancer Neg Hx    Rectal cancer Neg Hx  Stomach cancer Neg Hx      Review of Systems  Musculoskeletal:  Positive for arthralgias and gait problem.  All other systems reviewed and are negative.   Objective:  Physical Exam Constitutional:      Appearance: Normal appearance.  HENT:     Head: Normocephalic and atraumatic.     Nose: Nose normal.     Mouth/Throat:     Mouth: Mucous membranes are moist.     Pharynx: Oropharynx is clear.  Eyes:     Conjunctiva/sclera: Conjunctivae normal.  Cardiovascular:     Rate and Rhythm: Normal rate and regular rhythm.     Pulses: Normal pulses.     Heart sounds: Normal heart sounds.  Pulmonary:     Effort: Pulmonary effort is normal.     Breath sounds: Normal breath sounds.  Abdominal:     General: Abdomen is flat.     Palpations: Abdomen is soft.  Genitourinary:    Comments: Deferred.  Musculoskeletal:     Cervical back: Normal range of motion and neck supple.     Comments: Examination of the right hip reveals no skin wounds or lesions. Pain with flexion and rotation of the hip. Mild trochanteric tenderness palpation. He does have restricted range of motion of the right hip. He has a 15 flexion contracture, and I can flex him up to 90. He internally rotates 5,  externally rotates 20. Positive Stinchfield. He ambulates with a mildly antalgic gait.  Neurovascularly intact distally.  Skin:    General: Skin is warm and dry.     Capillary Refill: Capillary refill takes less than 2 seconds.  Neurological:     General: No focal deficit present.     Mental Status: He is alert and oriented to person, place, and time.  Psychiatric:        Mood and Affect: Mood normal.        Behavior: Behavior normal.        Thought Content: Thought content normal.        Judgment: Judgment normal.     Vital signs in last 24 hours: @VSRANGES @  Labs:   Estimated body mass index is 24.82 kg/m as calculated from the following:   Height as of 06/14/23: 6' (1.829 m).   Weight as of 06/14/23: 83 kg.   Imaging Review Plain radiographs demonstrate severe degenerative joint disease of the right hip(s). The bone quality appears to be adequate for age and reported activity level.      Assessment/Plan:  End stage arthritis, right hip(s)  The patient history, physical examination, clinical judgement of the provider and imaging studies are consistent with end stage degenerative joint disease of the right hip(s) and total hip arthroplasty is deemed medically necessary. The treatment options including medical management, injection therapy, arthroscopy and arthroplasty were discussed at length. The risks and benefits of total hip arthroplasty were presented and reviewed. The risks due to aseptic loosening, infection, stiffness, dislocation/subluxation,  thromboembolic complications and other imponderables were discussed.  The patient acknowledged the explanation, agreed to proceed with the plan and consent was signed. Patient is being admitted for inpatient treatment for surgery, pain control, PT, OT, prophylactic antibiotics, VTE prophylaxis, progressive ambulation and ADL's and discharge planning.The patient is planning to be discharged home with HEP after an overnight stay.    Therapy Plans: HEP.  Disposition: Home with wife  Planned DVT Prophylaxis: Eliquis 2.5mg  BID.  DME needed: walker.  PCP: Cleared. Neurology Cleared.  Vascular: Dr. Randie Heinz. IVC  filter placed.  TXA: IV  Allergies:  - hydrocodone - facial swelling.  - oxycodone - vomiting, unsure.  - Adhesive allergy - rash.  - Seasonal allergies.  Anesthesia Concerns: None.  BMI: 26.3  Last HgbA1c: 5.3  Other:  - IVC filter placed with Dr. Randie Heinz. - T2DM, glipizide, metformin  - History of DVT x2 left leg  - Tramadol, zofran.  - NO NSAIDs  - 06/14/23: Hgb 13.4, Cr. 1.26, K+ 3.6.    Patient's anticipated LOS is less than 2 midnights, meeting these requirements: - Younger than 30 - Lives within 1 hour of care - Has a competent adult at home to recover with post-op recover - NO history of  - Chronic pain requiring opiods  - Diabetes  - Coronary Artery Disease  - Heart failure  - Heart attack  - Stroke  - DVT/VTE  - Cardiac arrhythmia  - Respiratory Failure/COPD  - Renal failure  - Anemia  - Advanced Liver disease

## 2023-06-23 NOTE — H&P (View-Only) (Signed)
TOTAL HIP ADMISSION H&P  Patient is admitted for right total hip arthroplasty.  Subjective:  Chief Complaint: right hip pain  HPI: Bobby Lopez, 82 y.o. male, has a history of pain and functional disability in the right hip(s) due to arthritis and patient has failed non-surgical conservative treatments for greater than 12 weeks to include NSAID's and/or analgesics, corticosteriod injections, flexibility and strengthening excercises, use of assistive devices, and activity modification.  Onset of symptoms was gradual starting 10 years ago with rapidlly worsening course since that time.The patient noted no past surgery on the right hip(s).  Patient currently rates pain in the right hip at 10 out of 10 with activity. Patient has night pain, worsening of pain with activity and weight bearing, trendelenberg gait, pain that interfers with activities of daily living, and pain with passive range of motion. Patient has evidence of subchondral cysts, subchondral sclerosis, periarticular osteophytes, and joint space narrowing by imaging studies. This condition presents safety issues increasing the risk of falls.  There is no current active infection.  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   DJD (degenerative joint disease) 05/04/2014   Bladder stone 05/04/2014   Hematuria 11/13/2013   Postoperative ileus (HCC) 10/22/2013   Incisional hernia 01/01/2009   Diabetes mellitus without complication (HCC) 06/14/2007   Hyperlipidemia 06/14/2007   Essential hypertension 05/09/2007   Past Medical History:  Diagnosis Date   Arthritis    Back   Bladder stones    NEEDS TO HAVE SURGERY   BPH (benign prostatic hyperplasia)    Diabetes mellitus    type  II   DVT (deep venous thrombosis) (HCC)    Eczema    History of kidney stones    Hyperlipidemia    Hypertension    Lumbar herniated disc     Past  Surgical History:  Procedure Laterality Date   CHOLECYSTECTOMY     COLONOSCOPY  03/24/2017   per Dr. Lavon Paganini, adenomatous polyps, no repeats due to age    CYSTOSCOPY W/ RETROGRADES Bilateral 07/20/2014   Procedure: Zettie Cooley;  Surgeon: Crist Fat, MD;  Location: [facility];  Service: Urology;  Laterality: Bilateral;   HOLMIUM LASER APPLICATION N/A 07/20/2014   Procedure: HOLMIUM LASER APPLICATION;  Surgeon: Crist Fat, MD;  Location: [facility];  Service: Urology;  Laterality: N/A;   INSERTION OF MESH N/A 10/19/2013   Procedure: INSERTION OF MESH;  Surgeon: Robyne Askew, MD;  Location: [facility] OR;  Service: General;  Laterality: N/A;   IVC FILTER INSERTION N/A 04/05/2023   Procedure: IVC FILTER INSERTION;  Surgeon: Maeola Harman, MD;  Location: [facility] INVASIVE CV LAB;  Service: Cardiovascular;  Laterality: N/A;   LUMBAR LAMINECTOMY/DECOMPRESSION MICRODISCECTOMY  09/26/2012   Procedure: LUMBAR LAMINECTOMY/DECOMPRESSION MICRODISCECTOMY 1 LEVEL;  Surgeon: Mariam Dollar, MD;  Location: [facility];  Service: Neurosurgery;  Laterality: Left;  Lumbar Lamiectomy Extraforaminal Diskectomy Lumbar Three-Four Left   TRANSURETHRAL RESECTION OF PROSTATE N/A 07/20/2014   Procedure: TRANSURETHRAL RESECTION OF THE PROSTATE (TURP) WITH GYRUS;  Surgeon: Crist Fat, MD;  Location: [facility];  Service: Urology;  Laterality: N/A;   VENTRAL HERNIA REPAIR  10/19/2013   DR TOTH   VENTRAL HERNIA REPAIR N/A 10/19/2013   Procedure: LAPAROSCOPIC VENTRAL HERNIA;  Surgeon: Robyne Askew, MD;  Location: [facility] OR;  Service: General;  Laterality: N/A;    Current Outpatient Medications  Medication Sig Dispense Refill Last Dose   amLODipine (NORVASC) 5 MG tablet TAKE 1 TABLET BY MOUTH EVERY MORNING 90 tablet 0    atorvastatin (LIPITOR) 20 MG tablet TAKE ONE TABLET BY MOUTH EVERY EVENING 90 tablet 0    benzonatate (TESSALON) 200 MG capsule Take 1 capsule (200 mg total) by mouth every 6 (six) hours as needed  for cough. (Patient not taking: Reported on 06/07/2023) 60 capsule 0    Calcium Polycarbophil (FIBER-CAPS PO) Take 1 capsule by mouth in the morning.      cetirizine (KLS ALLER-TEC) 10 MG tablet Take 10 mg by mouth daily.      diazepam (VALIUM) 5 MG tablet Take 1 tablet (5 mg total) by mouth 2 (two) times daily. (Patient not taking: Reported on 06/07/2023) 6 tablet 0    docusate sodium (COLACE) 100 MG capsule Take 1 capsule (100 mg total) by mouth 2 (two) times daily as needed (take to keep stool soft.). (Patient taking differently: Take 100 mg by mouth in the morning.) 60 capsule 0    glipiZIDE (GLUCOTROL) 5 MG tablet TAKE ONE TABLET BY MOUTH TWICE A DAY BEFORE A MEAL 180 tablet 4    ibuprofen (ADVIL) 400 MG tablet Take 1 tablet (400 mg total) by mouth every 8 (eight) hours as needed. (Patient not taking: Reported on 06/07/2023) 15 tablet 0    losartan-hydrochlorothiazide (HYZAAR) 50-12.5 MG tablet TAKE 1 TABLET BY MOUTH DAILY 90 tablet 0    Menthol, Topical Analgesic, (BIOFREEZE EX) Apply 1 Application topically daily.      metFORMIN (GLUCOPHAGE) 500 MG tablet TAKE 1 TABLET BY MOUTH TWICE A DAY WITH A MEAL 180 tablet 0    methylPREDNISolone (MEDROL DOSEPAK) 4 MG TBPK tablet As directed 21 tablet 0    Multiple Vitamin (MULTIVITAMIN WITH MINERALS) TABS tablet Take 1 tablet by mouth daily. Centrum silver      Omega-3 Fatty Acids (FISH OIL PO) Take 1 capsule by mouth daily.      predniSONE (DELTASONE) 10 MG tablet Take 5 tablets (50 mg total) by mouth daily. (Patient not taking: Reported on 06/07/2023) 15 tablet 0    Probiotic Product (ALIGN PO) Take 1 tablet by mouth in the morning.      tiZANidine (ZANAFLEX) 4 MG capsule Take 1 capsule (4 mg total) by mouth 3 (three) times daily as needed for muscle spasms. (Patient not taking: Reported on 06/07/2023) 15 capsule 0    Current Facility-Administered Medications  Medication Dose Route Frequency Provider Last Rate Last Admin   0.9 %  sodium chloride  infusion  500 mL Intravenous Continuous Nandigam, Kavitha V, MD       Allergies  Allergen Reactions   Ace Inhibitors Cough   Hydrocodone Nausea And Vomiting   Wound Dressing Adhesive Rash    Some dressings break him out    Social History   Tobacco Use   Smoking status: Former    Current packs/day: 0.00    Types: Cigarettes    Quit date: 07/13/1973    Years since quitting: 49.9   Smokeless tobacco: Never   Tobacco comments:    quit > 46 years ago  Substance Use Topics   Alcohol use: No    Alcohol/week: 0.0 standard drinks of alcohol    Family History  Problem Relation Age of Onset   Diabetes Mother    Hypertension Mother    Esophageal cancer Sister    Colon cancer Neg Hx    Rectal cancer Neg Hx  Stomach cancer Neg Hx      Review of Systems  Musculoskeletal:  Positive for arthralgias and gait problem.  All other systems reviewed and are negative.   Objective:  Physical Exam Constitutional:      Appearance: Normal appearance.  HENT:     Head: Normocephalic and atraumatic.     Nose: Nose normal.     Mouth/Throat:     Mouth: Mucous membranes are moist.     Pharynx: Oropharynx is clear.  Eyes:     Conjunctiva/sclera: Conjunctivae normal.  Cardiovascular:     Rate and Rhythm: Normal rate and regular rhythm.     Pulses: Normal pulses.     Heart sounds: Normal heart sounds.  Pulmonary:     Effort: Pulmonary effort is normal.     Breath sounds: Normal breath sounds.  Abdominal:     General: Abdomen is flat.     Palpations: Abdomen is soft.  Genitourinary:    Comments: Deferred.  Musculoskeletal:     Cervical back: Normal range of motion and neck supple.     Comments: Examination of the right hip reveals no skin wounds or lesions. Pain with flexion and rotation of the hip. Mild trochanteric tenderness palpation. He does have restricted range of motion of the right hip. He has a 15 flexion contracture, and I can flex him up to 90. He internally rotates 5,  externally rotates 20. Positive Stinchfield. He ambulates with a mildly antalgic gait.  Neurovascularly intact distally.  Skin:    General: Skin is warm and dry.     Capillary Refill: Capillary refill takes less than 2 seconds.  Neurological:     General: No focal deficit present.     Mental Status: He is alert and oriented to person, place, and time.  Psychiatric:        Mood and Affect: Mood normal.        Behavior: Behavior normal.        Thought Content: Thought content normal.        Judgment: Judgment normal.     Vital signs in last 24 hours: @VSRANGES @  Labs:   Estimated body mass index is 24.82 kg/m as calculated from the following:   Height as of 06/14/23: 6' (1.829 m).   Weight as of 06/14/23: 83 kg.   Imaging Review Plain radiographs demonstrate severe degenerative joint disease of the right hip(s). The bone quality appears to be adequate for age and reported activity level.      Assessment/Plan:  End stage arthritis, right hip(s)  The patient history, physical examination, clinical judgement of the provider and imaging studies are consistent with end stage degenerative joint disease of the right hip(s) and total hip arthroplasty is deemed medically necessary. The treatment options including medical management, injection therapy, arthroscopy and arthroplasty were discussed at length. The risks and benefits of total hip arthroplasty were presented and reviewed. The risks due to aseptic loosening, infection, stiffness, dislocation/subluxation,  thromboembolic complications and other imponderables were discussed.  The patient acknowledged the explanation, agreed to proceed with the plan and consent was signed. Patient is being admitted for inpatient treatment for surgery, pain control, PT, OT, prophylactic antibiotics, VTE prophylaxis, progressive ambulation and ADL's and discharge planning.The patient is planning to be discharged home with HEP after an overnight stay.    Therapy Plans: HEP.  Disposition: Home with wife  Planned DVT Prophylaxis: Eliquis 2.5mg  BID.  DME needed: walker.  PCP: Cleared. Neurology Cleared.  Vascular: Dr. Randie Heinz. IVC  filter placed.  TXA: IV  Allergies:  - hydrocodone - facial swelling.  - oxycodone - vomiting, unsure.  - Adhesive allergy - rash.  - Seasonal allergies.  Anesthesia Concerns: None.  BMI: 26.3  Last HgbA1c: 5.3  Other:  - IVC filter placed with Dr. Randie Heinz. - T2DM, glipizide, metformin  - History of DVT x2 left leg  - Tramadol, zofran.  - NO NSAIDs  - 06/14/23: Hgb 13.4, Cr. 1.26, K+ 3.6.    Patient's anticipated LOS is less than 2 midnights, meeting these requirements: - Younger than 30 - Lives within 1 hour of care - Has a competent adult at home to recover with post-op recover - NO history of  - Chronic pain requiring opiods  - Diabetes  - Coronary Artery Disease  - Heart failure  - Heart attack  - Stroke  - DVT/VTE  - Cardiac arrhythmia  - Respiratory Failure/COPD  - Renal failure  - Anemia  - Advanced Liver disease

## 2023-06-24 ENCOUNTER — Other Ambulatory Visit: Payer: Self-pay

## 2023-06-24 ENCOUNTER — Ambulatory Visit (HOSPITAL_COMMUNITY): Payer: Medicare HMO | Admitting: Physician Assistant

## 2023-06-24 ENCOUNTER — Ambulatory Visit (HOSPITAL_BASED_OUTPATIENT_CLINIC_OR_DEPARTMENT_OTHER): Payer: Self-pay | Admitting: Certified Registered Nurse Anesthetist

## 2023-06-24 ENCOUNTER — Ambulatory Visit (HOSPITAL_COMMUNITY): Payer: Medicare HMO

## 2023-06-24 ENCOUNTER — Encounter (HOSPITAL_COMMUNITY)
Admission: RE | Disposition: A | Discharge status: Home / Self Care | Payer: Self-pay | Source: Ambulatory Visit | Attending: Orthopedic Surgery

## 2023-06-24 ENCOUNTER — Encounter (HOSPITAL_COMMUNITY): Payer: Self-pay | Admitting: Orthopedic Surgery

## 2023-06-24 ENCOUNTER — Ambulatory Visit (HOSPITAL_COMMUNITY)
Admission: RE | Admit: 2023-06-24 | Discharge: 2023-06-25 | Disposition: A | Discharge status: Home / Self Care | Payer: Medicare HMO | Source: Ambulatory Visit | Attending: Orthopedic Surgery | Admitting: Orthopedic Surgery

## 2023-06-24 DIAGNOSIS — Z86718 Personal history of other venous thrombosis and embolism: Secondary | ICD-10-CM | POA: Insufficient documentation

## 2023-06-24 DIAGNOSIS — M1611 Unilateral primary osteoarthritis, right hip: Secondary | ICD-10-CM | POA: Diagnosis present

## 2023-06-24 DIAGNOSIS — R339 Retention of urine, unspecified: Secondary | ICD-10-CM | POA: Insufficient documentation

## 2023-06-24 DIAGNOSIS — Z7984 Long term (current) use of oral hypoglycemic drugs: Secondary | ICD-10-CM | POA: Diagnosis not present

## 2023-06-24 DIAGNOSIS — E1122 Type 2 diabetes mellitus with diabetic chronic kidney disease: Secondary | ICD-10-CM | POA: Diagnosis not present

## 2023-06-24 DIAGNOSIS — N189 Chronic kidney disease, unspecified: Secondary | ICD-10-CM | POA: Diagnosis not present

## 2023-06-24 DIAGNOSIS — I129 Hypertensive chronic kidney disease with stage 1 through stage 4 chronic kidney disease, or unspecified chronic kidney disease: Secondary | ICD-10-CM | POA: Diagnosis not present

## 2023-06-24 DIAGNOSIS — E119 Type 2 diabetes mellitus without complications: Secondary | ICD-10-CM | POA: Diagnosis not present

## 2023-06-24 DIAGNOSIS — N365 Urethral false passage: Secondary | ICD-10-CM | POA: Insufficient documentation

## 2023-06-24 DIAGNOSIS — Z01818 Encounter for other preprocedural examination: Secondary | ICD-10-CM

## 2023-06-24 DIAGNOSIS — I10 Essential (primary) hypertension: Secondary | ICD-10-CM | POA: Insufficient documentation

## 2023-06-24 DIAGNOSIS — Z87891 Personal history of nicotine dependence: Secondary | ICD-10-CM | POA: Diagnosis not present

## 2023-06-24 DIAGNOSIS — Z79899 Other long term (current) drug therapy: Secondary | ICD-10-CM | POA: Diagnosis not present

## 2023-06-24 DIAGNOSIS — Z96641 Presence of right artificial hip joint: Secondary | ICD-10-CM | POA: Diagnosis present

## 2023-06-24 HISTORY — PX: TOTAL HIP ARTHROPLASTY: SHX124

## 2023-06-24 HISTORY — PX: CYSTOSCOPY: SUR368

## 2023-06-24 LAB — TYPE AND SCREEN
ABO/RH(D): A POS
Antibody Screen: NEGATIVE

## 2023-06-24 LAB — GLUCOSE, CAPILLARY
Glucose-Capillary: 155 mg/dL — ABNORMAL HIGH (ref 70–99)
Glucose-Capillary: 171 mg/dL — ABNORMAL HIGH (ref 70–99)
Glucose-Capillary: 240 mg/dL — ABNORMAL HIGH (ref 70–99)
Glucose-Capillary: 299 mg/dL — ABNORMAL HIGH (ref 70–99)

## 2023-06-24 LAB — ABO/RH: ABO/RH(D): A POS

## 2023-06-24 SURGERY — ARTHROPLASTY, HIP, TOTAL, ANTERIOR APPROACH
Anesthesia: Spinal | Site: Hip | Laterality: Right

## 2023-06-24 MED ORDER — STERILE WATER FOR IRRIGATION IR SOLN
Status: DC | PRN
Start: 2023-06-24 — End: 2023-06-24
  Administered 2023-06-24: 2000 mL

## 2023-06-24 MED ORDER — DEXAMETHASONE SODIUM PHOSPHATE 10 MG/ML IJ SOLN
INTRAMUSCULAR | Status: AC
  Filled 2023-06-24: qty 1

## 2023-06-24 MED ORDER — AMISULPRIDE (ANTIEMETIC) 5 MG/2ML IV SOLN: 10.0000 mg | Freq: Once | INTRAVENOUS | Status: DC | PRN

## 2023-06-24 MED ORDER — TRANEXAMIC ACID-NACL 1000-0.7 MG/100ML-% IV SOLN
1000.0000 mg | Freq: Once | INTRAVENOUS | Status: AC
  Administered 2023-06-24: 1000 mg via INTRAVENOUS
  Filled 2023-06-24: qty 100

## 2023-06-24 MED ORDER — WATER FOR IRRIGATION, STERILE IR SOLN: Status: DC | PRN

## 2023-06-24 MED ORDER — TRAMADOL HCL 50 MG PO TABS
50.0000 mg | ORAL_TABLET | Freq: Four times a day (QID) | ORAL | Status: DC
  Administered 2023-06-25 (×4): 50 mg via ORAL
  Filled 2023-06-24 (×4): qty 1

## 2023-06-24 MED ORDER — ATORVASTATIN CALCIUM 20 MG PO TABS
20.0000 mg | ORAL_TABLET | Freq: Every evening | ORAL | Status: DC
  Administered 2023-06-24 – 2023-06-25 (×2): 20 mg via ORAL
  Filled 2023-06-24 (×2): qty 1

## 2023-06-24 MED ORDER — BUPIVACAINE-EPINEPHRINE 0.25% -1:200000 IJ SOLN
INTRAMUSCULAR | Status: AC
  Filled 2023-06-24: qty 1

## 2023-06-24 MED ORDER — PROPOFOL 500 MG/50ML IV EMUL
INTRAVENOUS | Status: AC
  Filled 2023-06-24: qty 50

## 2023-06-24 MED ORDER — TRANEXAMIC ACID-NACL 1000-0.7 MG/100ML-% IV SOLN
1000.0000 mg | INTRAVENOUS | Status: AC
  Administered 2023-06-24: 1000 mg via INTRAVENOUS
  Filled 2023-06-24: qty 100

## 2023-06-24 MED ORDER — PHENOL 1.4 % MT LIQD: 1.0000 | OROMUCOSAL | Status: DC | PRN

## 2023-06-24 MED ORDER — CEFAZOLIN SODIUM-DEXTROSE 2-4 GM/100ML-% IV SOLN
2.0000 g | INTRAVENOUS | Status: AC
  Administered 2023-06-24: 2 g via INTRAVENOUS
  Filled 2023-06-24: qty 100

## 2023-06-24 MED ORDER — CEFAZOLIN SODIUM-DEXTROSE 2-4 GM/100ML-% IV SOLN
2.0000 g | Freq: Four times a day (QID) | INTRAVENOUS | Status: AC
  Administered 2023-06-24 (×2): 2 g via INTRAVENOUS
  Filled 2023-06-24 (×2): qty 100

## 2023-06-24 MED ORDER — PROPOFOL 1000 MG/100ML IV EMUL
INTRAVENOUS | Status: AC
  Filled 2023-06-24: qty 100

## 2023-06-24 MED ORDER — METOCLOPRAMIDE HCL 5 MG/ML IJ SOLN: 5.0000 mg | Freq: Three times a day (TID) | INTRAMUSCULAR | Status: DC | PRN

## 2023-06-24 MED ORDER — ALUM & MAG HYDROXIDE-SIMETH 200-200-20 MG/5ML PO SUSP: 30.0000 mL | ORAL | Status: DC | PRN

## 2023-06-24 MED ORDER — LACTATED RINGERS IV SOLN: INTRAVENOUS | Status: DC

## 2023-06-24 MED ORDER — PHENYLEPHRINE HCL-NACL 20-0.9 MG/250ML-% IV SOLN
INTRAVENOUS | Status: DC | PRN
  Administered 2023-06-24: 40 ug/min via INTRAVENOUS

## 2023-06-24 MED ORDER — ONDANSETRON HCL 4 MG/2ML IJ SOLN
INTRAMUSCULAR | Status: DC | PRN
  Administered 2023-06-24: 4 mg via INTRAVENOUS

## 2023-06-24 MED ORDER — ACETAMINOPHEN 500 MG PO TABS
1000.0000 mg | ORAL_TABLET | Freq: Once | ORAL | Status: AC
  Administered 2023-06-24: 1000 mg via ORAL
  Filled 2023-06-24: qty 2

## 2023-06-24 MED ORDER — FENTANYL CITRATE (PF) 100 MCG/2ML IJ SOLN
INTRAMUSCULAR | Status: DC | PRN
  Administered 2023-06-24 (×2): 50 ug via INTRAVENOUS

## 2023-06-24 MED ORDER — ORAL CARE MOUTH RINSE: 15.0000 mL | Freq: Once | OROMUCOSAL | Status: AC

## 2023-06-24 MED ORDER — DIPHENHYDRAMINE HCL 12.5 MG/5ML PO ELIX: 12.5000 mg | ORAL_SOLUTION | ORAL | Status: DC | PRN

## 2023-06-24 MED ORDER — SENNA 8.6 MG PO TABS
1.0000 | ORAL_TABLET | Freq: Two times a day (BID) | ORAL | Status: DC
  Administered 2023-06-24 – 2023-06-25 (×3): 8.6 mg via ORAL
  Filled 2023-06-24 (×3): qty 1

## 2023-06-24 MED ORDER — POVIDONE-IODINE 10 % EX SWAB
2.0000 | Freq: Once | CUTANEOUS | Status: AC
  Administered 2023-06-24: 2 via TOPICAL

## 2023-06-24 MED ORDER — PANTOPRAZOLE SODIUM 40 MG PO TBEC
40.0000 mg | DELAYED_RELEASE_TABLET | Freq: Every day | ORAL | Status: DC
  Administered 2023-06-25: 40 mg via ORAL
  Filled 2023-06-24 (×2): qty 1

## 2023-06-24 MED ORDER — PROPOFOL 10 MG/ML IV BOLUS
INTRAVENOUS | Status: DC | PRN
  Administered 2023-06-24: 30 mg via INTRAVENOUS

## 2023-06-24 MED ORDER — SODIUM CHLORIDE 0.9 % IV SOLN: INTRAVENOUS | Status: DC

## 2023-06-24 MED ORDER — SODIUM CHLORIDE 0.9 % IR SOLN
Status: DC | PRN
  Administered 2023-06-24 (×2): 1000 mL

## 2023-06-24 MED ORDER — ISOPROPYL ALCOHOL 70 % SOLN
Status: DC | PRN
  Administered 2023-06-24: 1 via TOPICAL

## 2023-06-24 MED ORDER — BUPIVACAINE IN DEXTROSE 0.75-8.25 % IT SOLN
INTRATHECAL | Status: DC | PRN
Start: 2023-06-24 — End: 2023-06-24
  Administered 2023-06-24: 2 mL via INTRATHECAL

## 2023-06-24 MED ORDER — FENTANYL CITRATE (PF) 100 MCG/2ML IJ SOLN
INTRAMUSCULAR | Status: AC
  Filled 2023-06-24: qty 2

## 2023-06-24 MED ORDER — FERROUS SULFATE 325 (65 FE) MG PO TABS
325.0000 mg | ORAL_TABLET | Freq: Three times a day (TID) | ORAL | Status: DC
  Administered 2023-06-24 – 2023-06-25 (×2): 325 mg via ORAL
  Filled 2023-06-24 (×2): qty 1

## 2023-06-24 MED ORDER — DEXAMETHASONE SODIUM PHOSPHATE 10 MG/ML IJ SOLN
INTRAMUSCULAR | Status: DC | PRN
  Administered 2023-06-24: 5 mg via INTRAVENOUS

## 2023-06-24 MED ORDER — ALBUTEROL SULFATE (2.5 MG/3ML) 0.083% IN NEBU
INHALATION_SOLUTION | RESPIRATORY_TRACT | Status: AC
  Filled 2023-06-24: qty 3

## 2023-06-24 MED ORDER — METOCLOPRAMIDE HCL 5 MG PO TABS: 5.0000 mg | ORAL_TABLET | Freq: Three times a day (TID) | ORAL | Status: DC | PRN

## 2023-06-24 MED ORDER — SODIUM CHLORIDE (PF) 0.9 % IJ SOLN
INTRAMUSCULAR | Status: DC | PRN
  Administered 2023-06-24: 61 mL

## 2023-06-24 MED ORDER — ONDANSETRON HCL 4 MG/2ML IJ SOLN
INTRAMUSCULAR | Status: AC
  Filled 2023-06-24: qty 2

## 2023-06-24 MED ORDER — INSULIN ASPART 100 UNIT/ML IJ SOLN
0.0000 [IU] | Freq: Every day | INTRAMUSCULAR | Status: DC
  Administered 2023-06-24: 3 [IU] via SUBCUTANEOUS

## 2023-06-24 MED ORDER — MENTHOL 3 MG MT LOZG: 1.0000 | LOZENGE | OROMUCOSAL | Status: DC | PRN

## 2023-06-24 MED ORDER — SODIUM CHLORIDE (PF) 0.9 % IJ SOLN
INTRAMUSCULAR | Status: AC
  Filled 2023-06-24: qty 30

## 2023-06-24 MED ORDER — ACETAMINOPHEN 500 MG PO TABS
500.0000 mg | ORAL_TABLET | Freq: Four times a day (QID) | ORAL | Status: AC
  Administered 2023-06-24 – 2023-06-25 (×4): 500 mg via ORAL
  Filled 2023-06-24 (×4): qty 1

## 2023-06-24 MED ORDER — CHLORHEXIDINE GLUCONATE 0.12 % MT SOLN
15.0000 mL | Freq: Once | OROMUCOSAL | Status: AC
  Administered 2023-06-24: 15 mL via OROMUCOSAL

## 2023-06-24 MED ORDER — POVIDONE-IODINE 10 % EX SWAB: 2.0000 | Freq: Once | CUTANEOUS | Status: DC

## 2023-06-24 MED ORDER — INSULIN ASPART 100 UNIT/ML IJ SOLN
0.0000 [IU] | Freq: Three times a day (TID) | INTRAMUSCULAR | Status: DC
  Administered 2023-06-24: 3 [IU] via SUBCUTANEOUS
  Administered 2023-06-25 (×2): 2 [IU] via SUBCUTANEOUS

## 2023-06-24 MED ORDER — FENTANYL CITRATE PF 50 MCG/ML IJ SOSY: 25.0000 ug | PREFILLED_SYRINGE | INTRAMUSCULAR | Status: DC | PRN

## 2023-06-24 MED ORDER — POLYETHYLENE GLYCOL 3350 17 G PO PACK: 17.0000 g | PACK | Freq: Every day | ORAL | Status: DC | PRN

## 2023-06-24 MED ORDER — METHOCARBAMOL 500 MG PO TABS: 500.0000 mg | ORAL_TABLET | Freq: Four times a day (QID) | ORAL | Status: DC | PRN

## 2023-06-24 MED ORDER — ONDANSETRON HCL 4 MG/2ML IJ SOLN: 4.0000 mg | Freq: Four times a day (QID) | INTRAMUSCULAR | Status: DC | PRN

## 2023-06-24 MED ORDER — LORATADINE 10 MG PO TABS
10.0000 mg | ORAL_TABLET | Freq: Every day | ORAL | Status: DC
  Administered 2023-06-25: 10 mg via ORAL
  Filled 2023-06-24: qty 1

## 2023-06-24 MED ORDER — KETOROLAC TROMETHAMINE 30 MG/ML IJ SOLN
INTRAMUSCULAR | Status: AC
  Filled 2023-06-24: qty 1

## 2023-06-24 MED ORDER — BISACODYL 10 MG RE SUPP: 10.0000 mg | Freq: Every day | RECTAL | Status: DC | PRN

## 2023-06-24 MED ORDER — ONDANSETRON HCL 4 MG PO TABS: 4.0000 mg | ORAL_TABLET | Freq: Four times a day (QID) | ORAL | Status: DC | PRN

## 2023-06-24 MED ORDER — PROPOFOL 500 MG/50ML IV EMUL
INTRAVENOUS | Status: DC | PRN
  Administered 2023-06-24: 100 ug/kg/min via INTRAVENOUS

## 2023-06-24 MED ORDER — METHOCARBAMOL 500 MG IVPB - SIMPLE MED: 500.0000 mg | Freq: Four times a day (QID) | INTRAVENOUS | Status: DC | PRN

## 2023-06-24 MED ORDER — DOCUSATE SODIUM 100 MG PO CAPS
100.0000 mg | ORAL_CAPSULE | Freq: Two times a day (BID) | ORAL | Status: DC
  Administered 2023-06-24 – 2023-06-25 (×2): 100 mg via ORAL
  Filled 2023-06-24 (×2): qty 1

## 2023-06-24 MED ORDER — ONDANSETRON HCL 4 MG/2ML IJ SOLN: 4.0000 mg | Freq: Once | INTRAMUSCULAR | Status: DC | PRN

## 2023-06-24 MED ORDER — APIXABAN 2.5 MG PO TABS
2.5000 mg | ORAL_TABLET | Freq: Two times a day (BID) | ORAL | Status: DC
  Administered 2023-06-25: 2.5 mg via ORAL
  Filled 2023-06-24: qty 1

## 2023-06-24 SURGICAL SUPPLY — 63 items
ADH SKN CLS APL DERMABOND .7 (GAUZE/BANDAGES/DRESSINGS) ×2
APL PRP STRL LF DISP 70% ISPRP (MISCELLANEOUS) ×1
BAG COUNTER SPONGE SURGICOUNT (BAG) IMPLANT
BAG SPEC THK2 15X12 ZIP CLS (MISCELLANEOUS)
BAG SPNG CNTER NS LX DISP (BAG)
BAG ZIPLOCK 12X15 (MISCELLANEOUS) IMPLANT
BIT DRILL RINGLOC 3.2MMX20 (BIT) IMPLANT
BIT DRILL RINGLOC 3.2X20 (BIT) ×1
CATH FOLEY 2W COUNCIL 5CC 16FR (CATHETERS) IMPLANT
CATH FOLEY 2W COUNCIL 5CC 18FR (CATHETERS) IMPLANT
CHLORAPREP W/TINT 26 (MISCELLANEOUS) ×1 IMPLANT
COVER PERINEAL POST (MISCELLANEOUS) ×1 IMPLANT
COVER SURGICAL LIGHT HANDLE (MISCELLANEOUS) ×1 IMPLANT
DERMABOND ADVANCED .7 DNX12 (GAUZE/BANDAGES/DRESSINGS) ×2 IMPLANT
DRAPE IMP U-DRAPE 54X76 (DRAPES) ×1 IMPLANT
DRAPE SHEET LG 3/4 BI-LAMINATE (DRAPES) ×3 IMPLANT
DRAPE STERI IOBAN 125X83 (DRAPES) ×1 IMPLANT
DRAPE U-SHAPE 47X51 STRL (DRAPES) ×1 IMPLANT
DRILL BIT RINGLOC 3.2MMX20 (BIT) ×1
DRSG AQUACEL AG ADV 3.5X10 (GAUZE/BANDAGES/DRESSINGS) ×1 IMPLANT
ELECT REM PT RETURN 15FT ADLT (MISCELLANEOUS) ×1 IMPLANT
GAUZE SPONGE 4X4 12PLY STRL (GAUZE/BANDAGES/DRESSINGS) ×1 IMPLANT
GLOVE BIO SURGEON STRL SZ7 (GLOVE) ×1 IMPLANT
GLOVE BIO SURGEON STRL SZ8.5 (GLOVE) ×2 IMPLANT
GLOVE BIOGEL PI IND STRL 7.5 (GLOVE) ×1 IMPLANT
GLOVE BIOGEL PI IND STRL 8.5 (GLOVE) ×1 IMPLANT
GOWN SPEC L3 XXLG W/TWL (GOWN DISPOSABLE) ×1 IMPLANT
GOWN STRL REUS W/ TWL XL LVL3 (GOWN DISPOSABLE) ×1 IMPLANT
GOWN STRL REUS W/TWL XL LVL3 (GOWN DISPOSABLE) ×1
HANDPIECE INTERPULSE COAX TIP (DISPOSABLE) ×1
HEAD FEM +3XOFST 36XMDLR (Orthopedic Implant) IMPLANT
HEAD MODULAR 36MM (Orthopedic Implant) ×1 IMPLANT
HOLDER FOLEY CATH W/STRAP (MISCELLANEOUS) ×1 IMPLANT
HOOD PEEL AWAY T7 (MISCELLANEOUS) ×3 IMPLANT
KIT TURNOVER KIT A (KITS) IMPLANT
LINER ACETAB G7 NEUTRAL H 36 (Liner) IMPLANT
MANIFOLD NEPTUNE II (INSTRUMENTS) ×1 IMPLANT
MARKER SKIN DUAL TIP RULER LAB (MISCELLANEOUS) ×1 IMPLANT
NDL SAFETY ECLIP 18X1.5 (MISCELLANEOUS) ×1 IMPLANT
NDL SPNL 18GX3.5 QUINCKE PK (NEEDLE) ×1 IMPLANT
NEEDLE SPNL 18GX3.5 QUINCKE PK (NEEDLE) ×1
PACK ANTERIOR HIP CUSTOM (KITS) ×1 IMPLANT
SAW OSC TIP CART 19.5X105X1.3 (SAW) ×1 IMPLANT
SCREW BN 30X6.5XST STRL ACTB (Screw) IMPLANT
SCREW BONE 6.5X30 (Screw) ×1 IMPLANT
SEALER BIPOLAR AQUA 6.0 (INSTRUMENTS) ×1 IMPLANT
SET CYSTO W/LG BORE CLAMP LF (SET/KITS/TRAYS/PACK) IMPLANT
SET HNDPC FAN SPRY TIP SCT (DISPOSABLE) ×1 IMPLANT
SHELL ACET G7 4H 62 SZH (Shell) IMPLANT
SOLUTION PRONTOSAN WOUND 350ML (IRRIGATION / IRRIGATOR) ×1 IMPLANT
SPIKE FLUID TRANSFER (MISCELLANEOUS) ×1 IMPLANT
STEM FEM CL 119X39.6 SZ17 133D (Stem) IMPLANT
SUT MNCRL AB 3-0 PS2 18 (SUTURE) ×1 IMPLANT
SUT MON AB 2-0 CT1 36 (SUTURE) ×1 IMPLANT
SUT STRATAFIX PDO 1 14 VIOLET (SUTURE) ×1
SUT STRATFX PDO 1 14 VIOLET (SUTURE) ×1
SUT VIC AB 2-0 CT1 27 (SUTURE)
SUT VIC AB 2-0 CT1 TAPERPNT 27 (SUTURE) IMPLANT
SUTURE STRATFX PDO 1 14 VIOLET (SUTURE) ×1 IMPLANT
SYR 3ML LL SCALE MARK (SYRINGE) ×1 IMPLANT
TRAY FOLEY MTR SLVR 16FR STAT (SET/KITS/TRAYS/PACK) IMPLANT
TUBE SUCTION HIGH CAP CLEAR NV (SUCTIONS) ×1 IMPLANT
WATER STERILE IRR 1000ML POUR (IV SOLUTION) ×1 IMPLANT

## 2023-06-24 NOTE — Discharge Instructions (Addendum)
 Dr. Brian Swinteck Joint Replacement Specialist [facility] [address] [place], [place] [address] [phone]   TOTAL HIP REPLACEMENT POSTOPERATIVE DIRECTIONS    Hip Rehabilitation, Guidelines Following Surgery   WEIGHT BEARING Weight bearing as tolerated with assist device (walker, cane, etc) as directed, use it as long as suggested by your surgeon or therapist, typically at least 4-6 weeks.  The results of a hip operation are greatly improved after range of motion and muscle strengthening exercises. Follow all safety measures which are given to protect your hip. If any of these exercises cause increased pain or swelling in your joint, decrease the amount until you are comfortable again. Then slowly increase the exercises. Call your caregiver if you have problems or questions.   HOME CARE INSTRUCTIONS  Most of the following instructions are designed to prevent the dislocation of your new hip.  Remove items at home which could result in a fall. This includes throw rugs or furniture in walking pathways.  Continue medications as instructed at time of discharge. You may have some home medications which will be placed on hold until you complete the course of blood thinner medication. You may start showering once you are discharged home. Do not remove your dressing. Do not put on socks or shoes without following the instructions of your caregivers.   Sit on chairs with arms. Use the chair arms to help push yourself up when arising.  Arrange for the use of a toilet seat elevator so you are not sitting low.  Walk with walker as instructed.  You may resume a sexual relationship in one month or when given the OK by your caregiver.  Use walker as long as suggested by your caregivers.  You may put full weight on your legs and walk as much as is comfortable. Avoid periods of inactivity such as sitting longer than an hour when not asleep. This helps prevent blood  clots.  You may return to work once you are cleared by your surgeon.  Do not drive a car for 6 weeks or until released by your surgeon.  Do not drive while taking narcotics.  Wear elastic stockings for two weeks following surgery during the day but you may remove then at night.  Make sure you keep all of your appointments after your operation with all of your doctors and caregivers. You should call the office at the above phone number and make an appointment for approximately two weeks after the date of your surgery. Please pick up a stool softener and laxative for home use as long as you are requiring pain medications. ICE to the affected hip every three hours for 30 minutes at a time and then as needed for pain and swelling. Continue to use ice on the hip for pain and swelling from surgery. You may notice swelling that will progress down to the foot and ankle.  This is normal after surgery.  Elevate the leg when you are not up walking on it.   It is important for you to complete the blood thinner medication as prescribed by your doctor. Continue to use the breathing machine which will help keep your temperature down.  It is common for your temperature to cycle up and down following surgery, especially at night when you are not up moving around and exerting yourself.  The breathing machine keeps your lungs expanded and your temperature down.  RANGE OF MOTION AND STRENGTHENING EXERCISES  These exercises are designed to help you   keep full movement of your hip joint. Follow your caregiver's or physical therapist's instructions. Perform all exercises about fifteen times, three times per day or as directed. Exercise both hips, even if you have had only one joint replacement. These exercises can be done on a training (exercise) mat, on the floor, on a table or on a bed. Use whatever works the best and is most comfortable for you. Use music or television while you are exercising so that the exercises are a  pleasant break in your day. This will make your life better with the exercises acting as a break in routine you can look forward to.  Lying on your back, slowly slide your foot toward your buttocks, raising your knee up off the floor. Then slowly slide your foot back down until your leg is straight again.  Lying on your back spread your legs as far apart as you can without causing discomfort.  Lying on your side, raise your upper leg and foot straight up from the floor as far as is comfortable. Slowly lower the leg and repeat.  Lying on your back, tighten up the muscle in the front of your thigh (quadriceps muscles). You can do this by keeping your leg straight and trying to raise your heel off the floor. This helps strengthen the largest muscle supporting your knee.  Lying on your back, tighten up the muscles of your buttocks both with the legs straight and with the knee bent at a comfortable angle while keeping your heel on the floor.   SKILLED REHAB INSTRUCTIONS: If the patient is transferred to a skilled rehab facility following release from the hospital, a list of the current medications will be sent to the facility for the patient to continue.  When discharged from the skilled rehab facility, please have the facility set up the patient's Home Health Physical Therapy prior to being released. Also, the skilled facility will be responsible for providing the patient with their medications at time of release from the facility to include their pain medication and their blood thinner medication. If the patient is still at the rehab facility at time of the two week follow up appointment, the skilled rehab facility will also need to assist the patient in arranging follow up appointment in our office and any transportation needs.  POST-OPERATIVE OPIOID TAPER INSTRUCTIONS: It is important to wean off of your opioid medication as soon as possible. If you do not need pain medication after your surgery it is ok  to stop day one. Opioids include: Codeine, Hydrocodone(Norco, Vicodin), Oxycodone(Percocet, oxycontin) and hydromorphone amongst others.  Long term and even short term use of opiods can cause: Increased pain response Dependence Constipation Depression Respiratory depression And more.  Withdrawal symptoms can include Flu like symptoms Nausea, vomiting And more Techniques to manage these symptoms Hydrate well Eat regular healthy meals Stay active Use relaxation techniques(deep breathing, meditating, yoga) Do Not substitute Alcohol to help with tapering If you have been on opioids for less than two weeks and do not have pain than it is ok to stop all together.  Plan to wean off of opioids This plan should start within one week post op of your joint replacement. Maintain the same interval or time between taking each dose and first decrease the dose.  Cut the total daily intake of opioids by one tablet each day Next start to increase the time between doses. The last dose that should be eliminated is the evening dose.    MAKE   SURE YOU:  Understand these instructions.  Will watch your condition.  Will get help right away if you are not doing well or get worse.  Pick up stool softner and laxative for home use following surgery while on pain medications. Do not remove your dressing. The dressing is waterproof--it is OK to take showers. Continue to use ice for pain and swelling after surgery. Do not use any lotions or creams on the incision until instructed by your surgeon. Total Hip Protocol.   Information on my medicine - ELIQUIS (apixaban)  This medication education was reviewed with me or my healthcare representative as part of my discharge preparation.    Why was Eliquis prescribed for you? Eliquis was prescribed for you to reduce the risk of blood clots forming after orthopedic surgery.    What do You need to know about Eliquis? Take your Eliquis TWICE DAILY - one  tablet in the morning and one tablet in the evening with or without food.  It would be best to take the dose about the same time each day.  If you have difficulty swallowing the tablet whole please discuss with your pharmacist how to take the medication safely.  Take Eliquis exactly as prescribed by your doctor and DO NOT stop taking Eliquis without talking to the doctor who prescribed the medication.  Stopping without other medication to take the place of Eliquis may increase your risk of developing a clot.  After discharge, you should have regular check-up appointments with your healthcare provider that is prescribing your Eliquis.  What do you do if you miss a dose? If a dose of ELIQUIS is not taken at the scheduled time, take it as soon as possible on the same day and twice-daily administration should be resumed.  The dose should not be doubled to make up for a missed dose.  Do not take more than one tablet of ELIQUIS at the same time.  Important Safety Information A possible side effect of Eliquis is bleeding. You should call your healthcare provider right away if you experience any of the following: Bleeding from an injury or your nose that does not stop. Unusual colored urine (red or dark brown) or unusual colored stools (red or black). Unusual bruising for unknown reasons. A serious fall or if you hit your head (even if there is no bleeding).  Some medicines may interact with Eliquis and might increase your risk of bleeding or clotting while on Eliquis. To help avoid this, consult your healthcare provider or pharmacist prior to using any new prescription or non-prescription medications, including herbals, vitamins, non-steroidal anti-inflammatory drugs (NSAIDs) and supplements.  This website has more information on Eliquis (apixaban): http://www.eliquis.com/eliquis/home  

## 2023-06-24 NOTE — Transfer of Care (Signed)
Immediate Anesthesia Transfer of Care Note  Patient: Bobby Lopez  Procedure(s) Performed: RIGHT TOTAL HIP ARTHROPLASTY ANTERIOR APPROACH (Right: Hip)  Patient Location: PACU  Anesthesia Type:Spinal  Level of Consciousness: drowsy  Airway & Oxygen Therapy: Patient Spontanous Breathing and Patient connected to face mask  Post-op Assessment: Report given to RN and Post -op Vital signs reviewed and stable  Post vital signs: Reviewed and stable  Last Vitals:  Vitals Value Taken Time  BP 96/69 06/24/23 1216  Temp    Pulse 67 06/24/23 1216  Resp 20 06/24/23 1216  SpO2 99 % 06/24/23 1216  Vitals shown include unfiled device data.  Last Pain:  Vitals:   06/24/23 0740  TempSrc:   PainSc: 0-No pain         Complications: No notable events documented.

## 2023-06-24 NOTE — Evaluation (Signed)
Physical Therapy Evaluation Patient Details Name: Bobby Lopez MRN: [id number] DOB: 1941-03-17 Today's Date: 06/24/2023  History of Present Illness  Pt s/p R THR and with hx of DM, BPH and lumbar laminectomy  Clinical Impression  Pt s/p R THR and presents with decreased R LE strength/ROM and post op pain limiting functional mobility.  Pt should progress to dc home with family assist.        If plan is discharge home, recommend the following: A little help with walking and/or transfers;A little help with bathing/dressing/bathroom;Assistance with cooking/housework;Assist for transportation   Can travel by private vehicle        Equipment Recommendations Rolling walker (2 wheels)  Recommendations for Other Services       Functional Status Assessment Patient has had a recent decline in their functional status and demonstrates the ability to make significant improvements in function in a reasonable and predictable amount of time.     Precautions / Restrictions Precautions Precautions: None Restrictions Weight Bearing Restrictions: No Other Position/Activity Restrictions: WBAT      Mobility  Bed Mobility Overal bed mobility: Needs Assistance Bed Mobility: Supine to Sit     Supine to sit: Min assist     General bed mobility comments: cues for LE management and use of UEs to self assist    Transfers Overall transfer level: Needs assistance Equipment used: Rolling walker (2 wheels) Transfers: Sit to/from Stand Sit to Stand: Min assist           General transfer comment: steady assist with cues for LE management and use of UEs to self assist    Ambulation/Gait Ambulation/Gait assistance: Min assist Gait Distance (Feet): 90 Feet Assistive device: Rolling walker (2 wheels) Gait Pattern/deviations: Step-to pattern, Step-through pattern, Decreased step length - right, Decreased step length - left, Shuffle, Trunk flexed Gait velocity: decr     General Gait  Details: cues for posture, position from RW and initial sequence  Stairs            Wheelchair Mobility     Tilt Bed    Modified Rankin (Stroke Patients Only)       Balance Overall balance assessment: Mild deficits observed, not formally tested                                           Pertinent Vitals/Pain Pain Assessment Pain Assessment: 0-10 Pain Score: 3  Pain Location: R hip Pain Descriptors / Indicators: Aching, Sore Pain Intervention(s): Limited activity within patient's tolerance, Monitored during session, Premedicated before session, Ice applied    Home Living Family/patient expects to be discharged to:: Private residence Living Arrangements: Spouse/significant other Available Help at Discharge: Family;Available 24 hours/day Type of Home: House Home Access: Stairs to enter   Entrance Stairs-Number of Steps: 1 Alternate Level Stairs-Number of Steps: flight Home Layout: Two level Home Equipment: Rollator (4 wheels)      Prior Function Prior Level of Function : Independent/Modified Independent                     Extremity/Trunk Assessment   Upper Extremity Assessment Upper Extremity Assessment: Overall WFL for tasks assessed    Lower Extremity Assessment Lower Extremity Assessment: RLE deficits/detail    Cervical / Trunk Assessment Cervical / Trunk Assessment: Normal  Communication   Communication Communication: Hearing impairment Cueing Techniques: Verbal cues;Gestural cues  Cognition Arousal:  Alert Behavior During Therapy: WFL for tasks assessed/performed Overall Cognitive Status: Within Functional Limits for tasks assessed                                          General Comments      Exercises Total Joint Exercises Ankle Circles/Pumps: AROM, Both, 15 reps, Supine   Assessment/Plan    PT Assessment Patient needs continued PT services  PT Problem List Decreased strength;Decreased range  of motion;Decreased activity tolerance;Decreased balance;Decreased mobility;Decreased knowledge of use of DME;Pain       PT Treatment Interventions DME instruction;Gait training;Stair training;Functional mobility training;Therapeutic activities;Therapeutic exercise;Patient/family education    PT Goals (Current goals can be found in the Care Plan section)  Acute Rehab PT Goals Patient Stated Goal: Regain IND PT Goal Formulation: With patient Time For Goal Achievement: 07/01/23 Potential to Achieve Goals: Good    Frequency 7X/week     Co-evaluation               AM-PAC PT "6 Clicks" Mobility  Outcome Measure Help needed turning from your back to your side while in a flat bed without using bedrails?: A Little Help needed moving from lying on your back to sitting on the side of a flat bed without using bedrails?: A Little Help needed moving to and from a bed to a chair (including a wheelchair)?: A Little Help needed standing up from a chair using your arms (e.g., wheelchair or bedside chair)?: A Little Help needed to walk in hospital room?: A Little Help needed climbing 3-5 steps with a railing? : A Lot 6 Click Score: 17    End of Session Equipment Utilized During Treatment: Gait belt Activity Tolerance: Patient tolerated treatment well Patient left: in chair;with call bell/phone within reach;with chair alarm set;with family/visitor present Nurse Communication: Mobility status PT Visit Diagnosis: Difficulty in walking, not elsewhere classified (R26.2)    Time: 0865-7846 PT Time Calculation (min) (ACUTE ONLY): 26 min   Charges:   PT Evaluation $PT Eval Low Complexity: 1 Low PT Treatments $Gait Training: 8-22 mins PT General Charges $$ ACUTE PT VISIT: 1 Visit         Mauro Kaufmann PT Acute Rehabilitation Services Pager [phone] Office [phone]   Halaina Vanduzer 06/24/2023, 4:30 PM

## 2023-06-24 NOTE — Anesthesia Postprocedure Evaluation (Signed)
Anesthesia Post Note  Patient: Bobby Lopez [facility]  Procedure(s) Performed: RIGHT TOTAL HIP ARTHROPLASTY ANTERIOR APPROACH (Right: Hip)     Patient location during evaluation: PACU Anesthesia Type: Spinal Level of consciousness: awake Pain management: pain level controlled Vital Signs Assessment: post-procedure vital signs reviewed and stable Respiratory status: spontaneous breathing, nonlabored ventilation and respiratory function stable Cardiovascular status: blood pressure returned to baseline and stable Postop Assessment: no apparent nausea or vomiting Anesthetic complications: no   No notable events documented.  Last Vitals:  Vitals:   06/24/23 1415 06/24/23 1433  BP: (!) 106/58 105/65  Pulse: 65 66  Resp: 13 18  Temp: (!) 36.4 C 36.5 C  SpO2: 96% 97%    Last Pain:  Vitals:   06/24/23 1433  TempSrc:   PainSc: 3                  Bobby Lopez

## 2023-06-24 NOTE — Anesthesia Procedure Notes (Signed)
Procedure Name: MAC Date/Time: 06/24/2023 9:40 AM  Performed by: Vanessa Rush, CRNAPre-anesthesia Checklist: Patient identified, Emergency Drugs available, Suction available and Patient being monitored Patient Re-evaluated:Patient Re-evaluated prior to induction Oxygen Delivery Method: Simple face mask

## 2023-06-24 NOTE — Op Note (Addendum)
OPERATIVE REPORT  SURGEON: Samson Frederic, MD   ASSISTANT: Clint Bolder, PA-C.  PREOPERATIVE DIAGNOSIS: Right hip arthritis.   POSTOPERATIVE DIAGNOSIS: Right hip arthritis.   PROCEDURE: Right total hip arthroplasty, anterior approach.   IMPLANTS: Biomet Taperloc Complete Microplasty stem, size 17 x 119 mm, high offset. Biomet G7 OsseoTi Cup, size 62 mm. Biomet Vivacit-E liner, size 36 mm, H, neutral. Biomet metal head ball, size 36 + 3 mm. Cancellous bone screw, 6.5 x 30 mm.  ANESTHESIA:  MAC and Spinal  ESTIMATED BLOOD LOSS:-200 mL    ANTIBIOTICS: 2g Ancef.  DRAINS: None.  COMPLICATIONS: None.   CONDITION: PACU - hemodynamically stable.   BRIEF CLINICAL NOTE: Bobby Lopez is a 82 y.o. male with a long-standing history of Right hip arthritis. After failing conservative management, the patient was indicated for total hip arthroplasty. The risks, benefits, and alternatives to the procedure were explained, and the patient elected to proceed.  PROCEDURE IN DETAIL: Surgical site was marked by myself in the pre-op holding area. Once inside the operating room, spinal anesthesia was obtained, and a foley catheter was inserted. The patient was then positioned on the Hana table.  All bony prominences were well padded.  The hip was prepped and draped in the normal sterile surgical fashion.  A time-out was called verifying side and site of surgery. The patient received IV antibiotics within 60 minutes of beginning the procedure.   Bikini incision was made, and superficial dissection was performed lateral to the ASIS. The direct anterior approach to the hip was performed through the Hueter interval.  Lateral femoral circumflex vessels were treated with the Auqumantys. The anterior capsule was exposed and an inverted T capsulotomy was made. The femoral neck cut was made to the level of the templated cut.  A corkscrew was placed into the head and the head was removed.  The femoral head  was found to have eburnated bone. The head was passed to the back table and was measured. Pubofemoral ligament was released off of the calcar, taking care to stay on bone. Superior capsule was released from the greater trochanter, taking care to stay lateral to the posterior border of the femoral neck in order to preserve the short external rotators.   Acetabular exposure was achieved, and the pulvinar and labrum were excised. Sequential reaming of the acetabulum was then performed up to a size 61 mm reamer. A 62 mm cup was then opened and impacted into place at approximately 40 degrees of abduction and 20 degrees of anteversion. I elected to augment the already acceptable press fit fixation with a single cancellous bone screw. The final polyethylene liner was impacted into place and acetabular osteophytes were removed.    I then gained femoral exposure taking care to protect the abductors and greater trochanter.  This was performed using standard external rotation, extension, and adduction.  A cookie cutter was used to enter the femoral canal, and then the femoral canal finder was placed.  Sequential broaching was performed up to a size 17.  Calcar planer was used on the femoral neck remnant.  I placed a high offset neck and a trial head ball.  The hip was reduced.  Leg lengths and offset were checked fluoroscopically.  The hip was dislocated and trial components were removed.  The final implants were placed, and the hip was reduced.  Fluoroscopy was used to confirm component position and leg lengths.  At 90 degrees of external rotation and full extension, the hip was stable  to an anterior directed force.   The wound was copiously irrigated with Prontosan solution and normal saline using pulse lavage.  Marcaine solution was injected into the periarticular soft tissue.  The wound was closed in layers using #1 Vicryl and V-Loc for the fascia, 2-0 Vicryl for the subcutaneous fat, 2-0 Monocryl for the deep dermal  layer, 3-0 running Monocryl subcuticular stitch, and Dermabond for the skin.  Once the glue was fully dried, an Aquacell Ag dressing was applied.  The patient was transported to the recovery room in stable condition.  Sponge, needle, and instrument counts were correct at the end of the case x2.  The patient tolerated the procedure well and there were no known complications.  The aquamantis was utilized for this case to help facilitate better hemostasis as patient was felt to be at increased risk of bleeding because of recent anticoagulation use.  A oscillating saw tip was utilized for this case to prevent damage to the soft tissue structures such as muscles, ligaments and tendons, and to ensure accurate bone cuts. This patient was at increased risk for above structures due to  minimally invasive approach.  Please note that a surgical assistant was a medical necessity for this procedure to perform it in a safe and expeditious manner. Assistant was necessary to provide appropriate retraction of vital neurovascular structures, to prevent femoral fracture, and to allow for anatomic placement of the prosthesis.

## 2023-06-24 NOTE — Procedures (Addendum)
Urology was called to the operating room where Bobby Lopez was undergoing hip replacement.  They were unable to place Foley catheter at that time and some urethral passage of blood was noted.  I arrived at the end of the case and patient was prepped and draped in the usual sterile fashion.  I elected to proceed directly to cystoscopy based on the obstructions reported from previous attempts.  There was not a urethral stricture identified though there were some false passages and trauma appreciated in the mid to distal urethra.  I was able to navigate around these and advance the cystoscope into the bladder without resistance.  A sensor wire was placed and the scope was offloaded.  I then attempted to advance an 17f council catheter over wire and met resistance.  This was removed and I tried again with a 31f council tip catheter which passed into the bladder without resistance, immediately returning clear yellow urine.  At this point the balloon was inflated with 10cc of sterile water, and the catheter was placed to gravity drainage without dependent loops.  Addendum: Bobby Lopez has requested that Foley catheter stay in place over the weekend out of consideration for the urethral trauma, associated risk of infection, and recent hip surgery.  He will be seen in our clinic Monday or Tuesday for voiding trial.  Please provide with 3 days of Keflex on discharge  [facility] [phone]  Bobby Kirschner, NP [facility] Pager: [phone]

## 2023-06-24 NOTE — Anesthesia Procedure Notes (Signed)
Spinal  Patient location during procedure: OR Start time: 06/24/2023 9:25 AM End time: 06/24/2023 9:35 AM Reason for block: surgical anesthesia Staffing Performed: anesthesiologist  Anesthesiologist: Leonides Grills, MD Performed by: Leonides Grills, MD Authorized by: Leonides Grills, MD   Preanesthetic Checklist Completed: patient identified, IV checked, risks and benefits discussed, surgical consent, monitors and equipment checked, pre-op evaluation and timeout performed Spinal Block Patient position: sitting Prep: DuraPrep Patient monitoring: cardiac monitor, continuous pulse ox and blood pressure Approach: left paramedian Location: L3-4 Injection technique: single-shot Needle Needle type: Pencan  Needle gauge: 24 G Needle length: 9 cm Assessment Sensory level: T10 Events: CSF return Additional Notes Functioning IV was confirmed and monitors were applied. Sterile prep and drape, including hand hygiene and sterile gloves were used. The patient was positioned and the spine was prepped. The skin was anesthetized with lidocaine.  Free flow of clear CSF was obtained on the fourth attempt prior to injecting local anesthetic into the CSF.  The spinal needle aspirated freely following injection.  The needle was carefully withdrawn.  The patient tolerated the procedure well.

## 2023-06-24 NOTE — Interval H&P Note (Signed)
History and Physical Interval Note:  06/24/2023 7:35 AM  Bobby Lopez  has presented today for surgery, with the diagnosis of Right hip osteoarthritis.  The various methods of treatment have been discussed with the patient and family. After consideration of risks, benefits and other options for treatment, the patient has consented to  Procedure(s) with comments: TOTAL HIP ARTHROPLASTY ANTERIOR APPROACH (Right) - 140 as a surgical intervention.  The patient's history has been reviewed, patient examined, no change in status, stable for surgery.  I have reviewed the patient's chart and labs.  Questions were answered to the patient's satisfaction.     Iline Oven Laylia Mui

## 2023-06-25 ENCOUNTER — Encounter (HOSPITAL_COMMUNITY): Payer: Self-pay | Admitting: Orthopedic Surgery

## 2023-06-25 DIAGNOSIS — M1611 Unilateral primary osteoarthritis, right hip: Secondary | ICD-10-CM | POA: Diagnosis not present

## 2023-06-25 LAB — BASIC METABOLIC PANEL
Anion gap: 7 (ref 5–15)
BUN: 32 mg/dL — ABNORMAL HIGH (ref 8–23)
CO2: 22 mmol/L (ref 22–32)
Calcium: 8.1 mg/dL — ABNORMAL LOW (ref 8.9–10.3)
Chloride: 107 mmol/L (ref 98–111)
Creatinine, Ser: 1.32 mg/dL — ABNORMAL HIGH (ref 0.61–1.24)
GFR, Estimated: 54 mL/min — ABNORMAL LOW (ref >60)
Glucose, Bld: 172 mg/dL — ABNORMAL HIGH (ref 70–99)
Potassium: 3.5 mmol/L (ref 3.5–5.1)
Sodium: 136 mmol/L (ref 135–145)

## 2023-06-25 LAB — CBC
HCT: 30.5 % — ABNORMAL LOW (ref 39.0–52.0)
Hemoglobin: 10.5 g/dL — ABNORMAL LOW (ref 13.0–17.0)
MCH: 31.4 pg (ref 26.0–34.0)
MCHC: 34.4 g/dL (ref 30.0–36.0)
MCV: 91.3 fL (ref 80.0–100.0)
Platelets: 128 10*3/uL — ABNORMAL LOW (ref 150–400)
RBC: 3.34 MIL/uL — ABNORMAL LOW (ref 4.22–5.81)
RDW: 13.1 % (ref 11.5–15.5)
WBC: 13.6 10*3/uL — ABNORMAL HIGH (ref 4.0–10.5)
nRBC: 0 % (ref 0.0–0.2)

## 2023-06-25 LAB — GLUCOSE, CAPILLARY
Glucose-Capillary: 163 mg/dL — ABNORMAL HIGH (ref 70–99)
Glucose-Capillary: 193 mg/dL — ABNORMAL HIGH (ref 70–99)

## 2023-06-25 MED ORDER — TAMSULOSIN HCL 0.4 MG PO CAPS: 0.4000 mg | ORAL_CAPSULE | Freq: Every day | ORAL | 0 refills | Status: AC

## 2023-06-25 MED ORDER — TRAMADOL HCL 50 MG PO TABS
50.0000 mg | ORAL_TABLET | Freq: Four times a day (QID) | ORAL | 0 refills | Status: AC | PRN
Start: 2023-06-25 — End: 2023-07-02

## 2023-06-25 MED ORDER — POLYETHYLENE GLYCOL 3350 17 G PO PACK: 17.0000 g | PACK | Freq: Every day | ORAL | 0 refills | Status: DC | PRN

## 2023-06-25 MED ORDER — HYDROMORPHONE HCL 1 MG/ML IJ SOLN
0.5000 mg | Freq: Once | INTRAMUSCULAR | Status: AC
  Administered 2023-06-25: 0.5 mg via INTRAVENOUS
  Filled 2023-06-25: qty 0.5

## 2023-06-25 MED ORDER — SENNA 8.6 MG PO TABS
2.0000 | ORAL_TABLET | Freq: Every day | ORAL | 0 refills | Status: AC
Start: 2023-06-25 — End: 2023-07-10

## 2023-06-25 MED ORDER — TAMSULOSIN HCL 0.4 MG PO CAPS
0.4000 mg | ORAL_CAPSULE | Freq: Every day | ORAL | Status: DC
  Administered 2023-06-25: 0.4 mg via ORAL
  Filled 2023-06-25: qty 1

## 2023-06-25 MED ORDER — CEPHALEXIN 500 MG PO CAPS: 500.0000 mg | ORAL_CAPSULE | Freq: Two times a day (BID) | ORAL | 0 refills | Status: AC

## 2023-06-25 MED ORDER — ONDANSETRON HCL 4 MG PO TABS: 4.0000 mg | ORAL_TABLET | Freq: Three times a day (TID) | ORAL | 0 refills | Status: DC | PRN

## 2023-06-25 MED ORDER — APIXABAN 2.5 MG PO TABS: 2.5000 mg | ORAL_TABLET | Freq: Two times a day (BID) | ORAL | 0 refills | Status: DC

## 2023-06-25 MED ORDER — DOCUSATE SODIUM 100 MG PO CAPS: 100.0000 mg | ORAL_CAPSULE | Freq: Two times a day (BID) | ORAL | 0 refills | Status: AC

## 2023-06-25 NOTE — TOC Transition Note (Signed)
Transition of Care [facility]) - CM/SW Discharge Note   Patient Details  Name: Bobby Lopez MRN: [id number] Date of Birth: 17-Jan-1941  [facility] CM/SW Contact:  Amada Jupiter, LCSW Phone Number: 06/25/2023, 10:12 AM   Clinical Narrative:     Met with pt and confirming he has received RW to room via [employer]. Plan for HEP.  No further TOC needs.   Final next level of care: Home/Self Care Barriers to Discharge: No Barriers Identified   Patient Goals and CMS Choice      Discharge Placement                         Discharge Plan and Services Additional resources added to the After Visit Summary for                  DME Arranged: Walker rolling DME Agency: [employer]                  Social Determinants of Health (SDOH) Interventions SDOH Screenings   Food Insecurity: No Food Insecurity (06/24/2023)  Housing: Low Risk  (06/24/2023)  Transportation Needs: No Transportation Needs (06/24/2023)  Utilities: Not At Risk (06/24/2023)  Alcohol Screen: Low Risk  (03/26/2023)  Depression (PHQ2-9): Low Risk  (03/26/2023)  Financial Resource Strain: Low Risk  (03/26/2023)  Physical Activity: Insufficiently Active (03/26/2023)  Social Connections: Moderately Isolated (03/26/2023)  Stress: No Stress Concern Present (03/26/2023)  Tobacco Use: Medium Risk (06/24/2023)  Health Literacy: Adequate Health Literacy (03/26/2023)     Readmission Risk Interventions     No data to display

## 2023-06-25 NOTE — Progress Notes (Signed)
Discharge instructions given to patient and wife, patient going home with foley, teaching done with foley care and emptying. Patient passed PT at 1630 and A Hill PA made aware for discharge order at 1645. Patients wife very rude and demanding that they were ready to go, explained that we were waiting on the PA to put the orders in. When discharge being done patients wife complained they had to wait and were ready to go. Patients wife very rude and had several comments, about the time frame. Listened to wifes concerns and explained I was in another patients room D Anne Ng

## 2023-06-25 NOTE — Progress Notes (Signed)
Physical Therapy Treatment Patient Details Name: Bobby Lopez MRN: [id number] DOB: 16-Jul-1941 Today's Date: 06/25/2023   History of Present Illness Pt s/p R THR and with hx of DM, BPH and lumbar laminectomy    PT Comments  Pt very motivated and progressing well with mobility.  Pt up to ambulate increased distance in hall and HEP initiated with written instruction provided.  Will follow up in pm with stair training.    If plan is discharge home, recommend the following: A little help with walking and/or transfers;A little help with bathing/dressing/bathroom;Assistance with cooking/housework;Assist for transportation   Can travel by private vehicle        Equipment Recommendations  Rolling walker (2 wheels)    Recommendations for Other Services       Precautions / Restrictions Precautions Precautions: None Restrictions Weight Bearing Restrictions: No Other Position/Activity Restrictions: WBAT     Mobility  Bed Mobility               General bed mobility comments: NT - pt up in bathroom on arrival and requests up to chair at session end    Transfers Overall transfer level: Needs assistance Equipment used: Rolling walker (2 wheels) Transfers: Sit to/from Stand Sit to Stand: Contact guard assist           General transfer comment: min cues for LE management and use of UEs to self assist    Ambulation/Gait Ambulation/Gait assistance: Contact guard assist Gait Distance (Feet): 180 Feet Assistive device: Rolling walker (2 wheels) Gait Pattern/deviations: Step-to pattern, Step-through pattern, Decreased step length - right, Decreased step length - left, Shuffle, Trunk flexed Gait velocity: decr     General Gait Details: cues for posture, position from RW and initial sequence   Stairs             Wheelchair Mobility     Tilt Bed    Modified Rankin (Stroke Patients Only)       Balance Overall balance assessment: Mild deficits observed, not  formally tested                                          Cognition Arousal: Alert Behavior During Therapy: WFL for tasks assessed/performed Overall Cognitive Status: Within Functional Limits for tasks assessed                                          Exercises Total Joint Exercises Ankle Circles/Pumps: AROM, Both, 15 reps, Supine Quad Sets: AROM, Both, 10 reps, Supine Heel Slides: AAROM, Right, 20 reps, Supine Hip ABduction/ADduction: AAROM, Right, 15 reps, Supine Long Arc Quad: AROM, Right, 10 reps, Seated    General Comments        Pertinent Vitals/Pain Pain Assessment Pain Assessment: 0-10 Pain Score: 3  Pain Location: R hip Pain Descriptors / Indicators: Aching, Sore Pain Intervention(s): Limited activity within patient's tolerance, Monitored during session, Premedicated before session, Ice applied    Home Living                          Prior Function            PT Goals (current goals can now be found in the care plan section) Acute Rehab PT Goals Patient Stated Goal: Regain  IND PT Goal Formulation: With patient Time For Goal Achievement: 07/01/23 Potential to Achieve Goals: Good Progress towards PT goals: Progressing toward goals    Frequency    7X/week      PT Plan      Co-evaluation              AM-PAC PT "6 Clicks" Mobility   Outcome Measure  Help needed turning from your back to your side while in a flat bed without using bedrails?: A Little Help needed moving from lying on your back to sitting on the side of a flat bed without using bedrails?: A Little Help needed moving to and from a bed to a chair (including a wheelchair)?: A Little Help needed standing up from a chair using your arms (e.g., wheelchair or bedside chair)?: A Little Help needed to walk in hospital room?: A Little Help needed climbing 3-5 steps with a railing? : A Lot 6 Click Score: 17    End of Session Equipment  Utilized During Treatment: Gait belt Activity Tolerance: Patient tolerated treatment well Patient left: in chair;with call bell/phone within reach;with chair alarm set;with family/visitor present Nurse Communication: Mobility status PT Visit Diagnosis: Difficulty in walking, not elsewhere classified (R26.2)     Time: 1610-9604 PT Time Calculation (min) (ACUTE ONLY): 31 min  Charges:    $Gait Training: 8-22 mins $Therapeutic Exercise: 8-22 mins PT General Charges $$ ACUTE PT VISIT: 1 Visit                     Mauro Kaufmann PT Acute Rehabilitation Services Pager [phone] Office [phone]    Magdaline Zollars 06/25/2023, 12:38 PM

## 2023-06-25 NOTE — Progress Notes (Signed)
Physical Therapy Treatment Patient Details Name: Bobby Lopez MRN: [id number] DOB: Jun 05, 1941 Today's Date: 06/25/2023   History of Present Illness Pt s/p R THR and with hx of DM, BPH and lumbar laminectomy    PT Comments  Pt in good spirits and progressing well with mobility.  Pt up to ambulate in hall, negotiated stairs, reviewed car transfers and with multiple questions asked and answered.  Spouse present for session.  Pt eager for dc home this date.    If plan is discharge home, recommend the following: A little help with walking and/or transfers;A little help with bathing/dressing/bathroom;Assistance with cooking/housework;Assist for transportation   Can travel by private vehicle        Equipment Recommendations  Rolling walker (2 wheels)    Recommendations for Other Services       Precautions / Restrictions Precautions Precautions: None Restrictions Weight Bearing Restrictions: No Other Position/Activity Restrictions: WBAT     Mobility  Bed Mobility Overal bed mobility: Needs Assistance Bed Mobility: Supine to Sit     Supine to sit: Contact guard, Supervision     General bed mobility comments: cues for sequence and use of UEs to self assist    Transfers Overall transfer level: Needs assistance Equipment used: Rolling walker (2 wheels) Transfers: Sit to/from Stand Sit to Stand: Supervision           General transfer comment: min cues for LE management and use of UEs to self assist    Ambulation/Gait Ambulation/Gait assistance: Contact guard assist, Supervision Gait Distance (Feet): 100 Feet Assistive device: Rolling walker (2 wheels) Gait Pattern/deviations: Step-to pattern, Step-through pattern, Decreased step length - right, Decreased step length - left, Shuffle, Trunk flexed Gait velocity: decr     General Gait Details: cues for posture, position from RW and initial sequence   Stairs Stairs: Yes Stairs assistance: Min assist, Contact guard  assist Stair Management: No rails, Step to pattern, One rail Right, Forwards, With walker, With cane Number of Stairs: 7 General stair comments: 5 stairs with rail and cane; single step twice with RW; cues for sequence   Wheelchair Mobility     Tilt Bed    Modified Rankin (Stroke Patients Only)       Balance Overall balance assessment: Mild deficits observed, not formally tested                                          Cognition Arousal: Alert Behavior During Therapy: WFL for tasks assessed/performed Overall Cognitive Status: Within Functional Limits for tasks assessed                                          Exercises Total Joint Exercises Ankle Circles/Pumps: AROM, Both, 15 reps, Supine Quad Sets: AROM, Both, 10 reps, Supine Heel Slides: AAROM, Right, 20 reps, Supine Hip ABduction/ADduction: AAROM, Right, 15 reps, Supine Long Arc Quad: AROM, Right, 10 reps, Seated    General Comments        Pertinent Vitals/Pain Pain Assessment Pain Assessment: 0-10 Pain Score: 3  Pain Location: R hip Pain Descriptors / Indicators: Aching, Sore Pain Intervention(s): Limited activity within patient's tolerance, Monitored during session, Premedicated before session    Home Living  Prior Function            PT Goals (current goals can now be found in the care plan section) Acute Rehab PT Goals Patient Stated Goal: Regain IND PT Goal Formulation: With patient Time For Goal Achievement: 07/01/23 Potential to Achieve Goals: Good Progress towards PT goals: Progressing toward goals    Frequency    7X/week      PT Plan      Co-evaluation              AM-PAC PT "6 Clicks" Mobility   Outcome Measure  Help needed turning from your back to your side while in a flat bed without using bedrails?: A Little Help needed moving from lying on your back to sitting on the side of a flat bed without  using bedrails?: A Little Help needed moving to and from a bed to a chair (including a wheelchair)?: A Little Help needed standing up from a chair using your arms (e.g., wheelchair or bedside chair)?: A Little Help needed to walk in hospital room?: A Little Help needed climbing 3-5 steps with a railing? : A Little 6 Click Score: 18    End of Session Equipment Utilized During Treatment: Gait belt Activity Tolerance: Patient tolerated treatment well Patient left: in chair;with call bell/phone within reach;with chair alarm set;with family/visitor present Nurse Communication: Mobility status PT Visit Diagnosis: Difficulty in walking, not elsewhere classified (R26.2)     Time: 7829-5621 PT Time Calculation (min) (ACUTE ONLY): 23 min  Charges:    $Gait Training: 8-22 mins $Therapeutic Activity: 8-22 mins PT General Charges $$ ACUTE PT VISIT: 1 Visit                     Bobby Lopez PT Acute Rehabilitation Services Pager [phone] Office [phone]    Bobby Lopez 06/25/2023, 4:45 PM

## 2023-06-25 NOTE — Procedures (Addendum)
I have been checking on Bobby Lopez throughout the day today, monitoring his every 2 bladder scans.  Unfortunately his volume has continued to increase and he has begun to feel uncomfortable.  Considering that we are nearing end of day and his discharge on Friday, the shared decision was made to proceed with Foley catheter placement.  Patient was prepped and draped in the usual sterile fashion.  10 cc of sterile lidocaine lubricant was injected directly into the urethra.  After adequate time for analgesic effect was provided I attempted to advance an 2 [place] coud catheter.  This stopped around the same location yesterday where an established false passage was.  I then attempted a 78 [place] coud catheter with the same effect.  An angled zip wire was prepared and was falling into the same passage.  At that point patient approved to proceed with cystoscopy.  There was better visualization today and more trauma was appreciated.  There also appeared to be some residual stone debris embedded in prostatic tissue.  I was able to navigate the cystoscope into the bladder eventually without passing through an obvious stricture.  At this point a sensor wire was fed into the bladder and the scope was offloaded.  I threaded a 77f council tip catheter over wire, removed wire and observed return of clear yellow urine.  10 cc of sterile water was then injected into the retention balloon.  Catheter was placed to gravity drainage without dependent loops, concluding the procedure.  Patient was cleaned and dried and StatLock was placed on the inner thigh keeping the catheter out of tension.  Do not remove Foley catheter without discussing with urology.  Patient is scheduled for 930 Monday morning for voiding trial, though considering the trauma of a second cystoscopy this may be pushed back.  Will communicate any changes in plan with the patient.  The patient was discussed with me and I agree with the assessment and  plan.  Vilma Prader, MD

## 2023-06-25 NOTE — Progress Notes (Signed)
    Subjective:  Patient reports pain as mild.  Denies N/V/CP/SOB/Abd pain. He denies any tingling or numbness in LE bilaterally. He reports pain very well controlled with tramadol and tylenol.   He is using incentive spirometry and drinking fluids well this morning.   Objective:   VITALS:   Vitals:   06/24/23 2137 06/25/23 0104 06/25/23 0535 06/25/23 1004  BP: 98/60 (!) 102/59 115/67 138/69  Pulse: 69 65 62 72  Resp: 17 17 17 18   Temp: 98.1 F (36.7 C) 98.2 F (36.8 C) 98.8 F (37.1 C) 97.8 F (36.6 C)  TempSrc: Oral Oral Oral Oral  SpO2: 97% 97% 96% 99%  Weight:        Patient sitting up in bed. NAD.  Neurologically intact ABD soft Neurovascular intact Sensation intact distally Intact pulses distally Dorsiflexion/Plantar flexion intact Incision: dressing C/D/I No cellulitis present Compartment soft   Lab Results  Component Value Date   WBC 13.6 (H) 06/25/2023   HGB 10.5 (L) 06/25/2023   HCT 30.5 (L) 06/25/2023   MCV 91.3 06/25/2023   PLT 128 (L) 06/25/2023   BMET    Component Value Date/Time   NA 136 06/25/2023 0316   NA 142 12/08/2013 0908   K 3.5 06/25/2023 0316   K 3.6 12/08/2013 0908   CL 107 06/25/2023 0316   CO2 22 06/25/2023 0316   CO2 25 12/08/2013 0908   GLUCOSE 172 (H) 06/25/2023 0316   GLUCOSE 211 (H) 12/08/2013 0908   BUN 32 (H) 06/25/2023 0316   BUN 12.7 12/08/2013 0908   CREATININE 1.32 (H) 06/25/2023 0316   CREATININE 1.0 12/08/2013 0908   CALCIUM 8.1 (L) 06/25/2023 0316   CALCIUM 9.8 12/08/2013 0908   GFRNONAA 54 (L) 06/25/2023 0316     Assessment/Plan: 1 Day Post-Op   Principal Problem:   Primary osteoarthritis of right hip Active Problems:   S/P total right hip arthroplasty  ABLA. Hemoglobin 10.5. Continue to monitor.   WBAT with walker DVT ppx:  Eliquis , SCDs, TEDS PO pain control PT/OT Dispo:  - Foley catheter removed prematurely today. We will have to see how he does as urology recommended to keep foley over  the weekend, due to difficulty with insertion post cystoscopy yesterday. Urology recommends bladder scan every 2 hours with interventions if greater than 500cc.  - D/c pending clearance with PT and urine output.    Clois Dupes, PA-C 06/25/2023, 11:03 AM   [facility]  [place] [address], [address], [place], [place] [address] Phone: [phone] [email or web address] Facebook  [employer]

## 2023-06-25 NOTE — Progress Notes (Signed)
   1 Day Post-Op Subjective: NAEON. Foley was removed  Objective: Vital signs in last 24 hours: Temp:  [95.8 F (35.4 C)-98.8 F (37.1 C)] 98.8 F (37.1 C) (10/11 0535) Pulse Rate:  [56-77] 62 (10/11 0535) Resp:  [10-18] 17 (10/11 0535) BP: (96-116)/(58-69) 115/67 (10/11 0535) SpO2:  [95 %-99 %] 96 % (10/11 0535)  Assessment/Plan: #difficult foley #false passage  Cystoscopy with over wire placement of Foley catheter in the OR. Recommendation made for 3 days of Keflex on discharge and to leave Foley catheter in place until outpatient voiding trial.  Per nursing, foley catheter ordered out last night by primary team.  Follow-up appointment with my clinic has been canceled. Pt may schedule as needed.  50cc voided w/PVR of .  Further management per primary team.  Urology will sign off at this time.  Intake/Output from previous day: 10/10 0701 - 10/11 0700 In: 2533.1 [P.O.:480; I.V.:1653.1; IV Piggyback:400] Out: 2650 [Urine:2450; Blood:200]  Intake/Output this shift: Total I/O In: -  Out: 50 [Urine:50]  Physical Exam:  General: Alert and oriented CV: No cyanosis Lungs: equal chest rise   Lab Results: Recent Labs    06/25/23 0316  HGB 10.5*  HCT 30.5*   BMET Recent Labs    06/25/23 0316  NA 136  K 3.5  CL 107  CO2 22  GLUCOSE 172*  BUN 32*  CREATININE 1.32*  CALCIUM 8.1*     Studies/Results: DG Pelvis Portable  Result Date: 06/24/2023 CLINICAL DATA:  Status post right total hip arthroplasty EXAM: PORTABLE PELVIS 1-2 VIEWS COMPARISON:  CT scan 12/23/2022 FINDINGS: Right total hip arthroplasty in place without periprosthetic fracture or acute complicating feature. Tubing projecting over the central pelvis compatible with Foley catheter in the urinary bladder. Mild degenerative arthropathy left hip with mild spurring of the femoral head. IMPRESSION: 1. Right total hip arthroplasty without acute complicating feature. 2. Mild degenerative arthropathy left  hip. Electronically Signed   By: Gaylyn Rong M.D.   On: 06/24/2023 15:26   DG HIP UNILAT WITH PELVIS 1V RIGHT  Result Date: 06/24/2023 CLINICAL DATA:  Right total hip arthroplasty, anterior approach EXAM: DG HIP (WITH OR WITHOUT PELVIS) 1V RIGHT COMPARISON:  CT pelvis 12/23/2022 FINDINGS: Frontal fluoroscopic spot image of the right hip lower pelvis demonstrates right total hip arthroplasty in place. No periprosthetic fracture or acute complicating feature. IMPRESSION: 1. Right total hip arthroplasty without complicating feature. Electronically Signed   By: Gaylyn Rong M.D.   On: 06/24/2023 15:25   DG C-Arm 1-60 Min-No Report  Result Date: 06/24/2023 Fluoroscopy was utilized by the requesting physician.  No radiographic interpretation.   DG C-Arm 1-60 Min-No Report  Result Date: 06/24/2023 Fluoroscopy was utilized by the requesting physician.  No radiographic interpretation.   DG C-Arm 1-60 Min-No Report  Result Date: 06/24/2023 Fluoroscopy was utilized by the requesting physician.  No radiographic interpretation.      LOS: 0 days   Bobby Kirschner, NP [facility] Pager: [phone]  06/25/2023, 10:00 AM

## 2023-06-29 ENCOUNTER — Telehealth: Payer: Self-pay | Admitting: Family Medicine

## 2023-06-29 ENCOUNTER — Other Ambulatory Visit: Payer: Self-pay

## 2023-06-29 DIAGNOSIS — Z86718 Personal history of other venous thrombosis and embolism: Secondary | ICD-10-CM

## 2023-06-29 NOTE — Telephone Encounter (Addendum)
Pt's spouse called to ask MD if he is able to tell her if Pt is up to date with his Pneumo shots &/or does he need the Prevnar 20?  Please advise.

## 2023-06-29 NOTE — Telephone Encounter (Signed)
Pt has already had both PNA vaccines

## 2023-06-29 NOTE — Telephone Encounter (Signed)
Spoke with pt spouse verbalized understanding that pt has already had reccommended  PNA vaccines per Dr Clent Ridges

## 2023-06-29 NOTE — Telephone Encounter (Signed)
He does not need the Prevnar 20

## 2023-07-05 ENCOUNTER — Telehealth: Payer: Self-pay | Admitting: Family Medicine

## 2023-07-05 NOTE — Telephone Encounter (Signed)
His wife says he has fallen 3 times in the past 2 weeks since his hip surgery. He gets lightheaded when he stands up. His BP has been averaging 100/50. I advised them to stop taking Amlodipine for now, and they will continue to monitor the BP.

## 2023-07-09 ENCOUNTER — Encounter: Payer: Self-pay | Admitting: Family Medicine

## 2023-07-09 ENCOUNTER — Ambulatory Visit (INDEPENDENT_AMBULATORY_CARE_PROVIDER_SITE_OTHER): Payer: Medicare HMO | Admitting: Family Medicine

## 2023-07-09 VITALS — BP 106/58 | HR 77 | Temp 97.7°F | Wt 188.6 lb

## 2023-07-09 DIAGNOSIS — I10 Essential (primary) hypertension: Secondary | ICD-10-CM | POA: Diagnosis not present

## 2023-07-09 DIAGNOSIS — N138 Other obstructive and reflux uropathy: Secondary | ICD-10-CM

## 2023-07-09 DIAGNOSIS — N401 Enlarged prostate with lower urinary tract symptoms: Secondary | ICD-10-CM

## 2023-07-09 DIAGNOSIS — Z96641 Presence of right artificial hip joint: Secondary | ICD-10-CM | POA: Diagnosis not present

## 2023-07-09 DIAGNOSIS — R55 Syncope and collapse: Secondary | ICD-10-CM | POA: Diagnosis not present

## 2023-07-09 NOTE — Progress Notes (Signed)
Subjective:    Patient ID: Bobby Lopez, male    DOB: [birth year], 82 y.o.   MRN: [id number]  HPI Here with his wife to discuss 3 falls he has had at home since his right total hip arthroplasty on 06-24-23. This was done by Dr. Samson Frederic. The surgery went well,. But he had issues with bladder outlet obstruction. He is now wearing an indwelling catheter. He saw his urologist , Dr. Vilma Prader, on 06-29-23 and he started him on Flomax 04 mg daily. Since then then Bobby Lopez has had 3 episodes at home where he suddenly felt nauseated and lightheaded, and then he passed out and fell. No injuries apparently. Each episode lasted only 30 seconds, and afterwards he was back to baseline. No SOB or chest pain or palpitations. They have not been able to check his BP because their cuff is broken. At my suggestion, he stopped taking his Amlodipine yesterday. Today he feels fine, but his BP is quite low.    Review of Systems  Constitutional: Negative.   Respiratory: Negative.    Cardiovascular: Negative.   Gastrointestinal:  Positive for nausea. Negative for abdominal distention, abdominal pain, blood in stool, diarrhea and vomiting.  Neurological:  Positive for syncope and light-headedness. Negative for dizziness, seizures, speech difficulty, numbness and headaches.       Objective:   Physical Exam Constitutional:      Comments: Walks with a walker   Cardiovascular:     Rate and Rhythm: Normal rate and regular rhythm.     Pulses: Normal pulses.     Heart sounds: Normal heart sounds.  Pulmonary:     Effort: Pulmonary effort is normal.     Breath sounds: Normal breath sounds.  Neurological:     General: No focal deficit present.     Mental Status: He is alert and oriented to person, place, and time.           Assessment & Plan:  Since his hip surgery he has had BOO and has been wearing an indwelling catheter. I advised him to stop the Flomax since orthostasis is a common side effect.  He will follow up with Dr. Jennette Bill next week. His BP remains quite low, so he will stay off Amlodipine, and we will have him take only 1/2 a pill of Losartan HCT daily. I advised them to purchase a new BP cuff to follow this at home. He will follow up with [place] on 2 weeks. We spent a total of ( 32  ) minutes reviewing records and discussing these issues.  Gershon Crane, MD

## 2023-07-19 ENCOUNTER — Ambulatory Visit (INDEPENDENT_AMBULATORY_CARE_PROVIDER_SITE_OTHER): Payer: Medicare HMO | Admitting: Family Medicine

## 2023-07-19 ENCOUNTER — Encounter: Payer: Self-pay | Admitting: Family Medicine

## 2023-07-19 VITALS — BP 100/54 | HR 63 | Temp 97.9°F | Wt 186.2 lb

## 2023-07-19 DIAGNOSIS — I10 Essential (primary) hypertension: Secondary | ICD-10-CM

## 2023-07-19 NOTE — Progress Notes (Signed)
   Subjective:    Patient ID: Bobby Lopez, male    DOB: 02-27-1941, 82 y.o.   MRN: [id number]  HPI Here to follow up on hypotension, lightheadedness, and falls. At our last visit he had stopped taking Amlodipine,  and we agreed to stop the Flomax and to decrease the Losartan HCT to 1/2 a pill a day. He now feels better. No more falls and no more lightheadedness. However his BP still stays low, averaging 100-110 systolic. He saw Urology last week, and they removed his catheter.    Review of Systems  Constitutional: Negative.   Respiratory: Negative.    Cardiovascular: Negative.   Neurological: Negative.        Objective:   Physical Exam Constitutional:      Appearance: Normal appearance.     Comments: Walks with a walker   Cardiovascular:     Rate and Rhythm: Normal rate and regular rhythm.     Pulses: Normal pulses.     Heart sounds: Normal heart sounds.  Pulmonary:     Effort: Pulmonary effort is normal.     Breath sounds: Normal breath sounds.  Neurological:     General: No focal deficit present.     Mental Status: He is alert and oriented to person, place, and time.           Assessment & Plan:  He is still a bit hypotensive, so we will stop the Losartan HCT completely. He will follow the BP at home and report back in 2-3 weeks.  Gershon Crane, MD

## 2023-07-23 ENCOUNTER — Other Ambulatory Visit: Payer: Self-pay | Admitting: Family Medicine

## 2023-08-09 ENCOUNTER — Other Ambulatory Visit: Payer: Self-pay

## 2023-08-09 ENCOUNTER — Ambulatory Visit (HOSPITAL_COMMUNITY)
Admission: RE | Admit: 2023-08-09 | Discharge: 2023-08-09 | Disposition: A | Discharge status: Home / Self Care | Payer: Medicare HMO | Attending: Vascular Surgery | Admitting: Vascular Surgery

## 2023-08-09 ENCOUNTER — Encounter (HOSPITAL_COMMUNITY)
Admission: RE | Disposition: A | Discharge status: Home / Self Care | Payer: Self-pay | Source: Home / Self Care | Attending: Vascular Surgery

## 2023-08-09 ENCOUNTER — Encounter (HOSPITAL_COMMUNITY): Payer: Self-pay | Admitting: Vascular Surgery

## 2023-08-09 DIAGNOSIS — Z4589 Encounter for adjustment and management of other implanted devices: Secondary | ICD-10-CM | POA: Diagnosis present

## 2023-08-09 DIAGNOSIS — Z9889 Other specified postprocedural states: Secondary | ICD-10-CM | POA: Diagnosis not present

## 2023-08-09 DIAGNOSIS — Z86718 Personal history of other venous thrombosis and embolism: Secondary | ICD-10-CM

## 2023-08-09 DIAGNOSIS — I82412 Acute embolism and thrombosis of left femoral vein: Secondary | ICD-10-CM | POA: Insufficient documentation

## 2023-08-09 DIAGNOSIS — Z452 Encounter for adjustment and management of vascular access device: Secondary | ICD-10-CM

## 2023-08-09 DIAGNOSIS — Z96641 Presence of right artificial hip joint: Secondary | ICD-10-CM | POA: Diagnosis not present

## 2023-08-09 HISTORY — PX: IVC FILTER REMOVAL: CATH118246

## 2023-08-09 LAB — POCT I-STAT, CHEM 8
BUN: 15 mg/dL (ref 8–23)
Calcium, Ion: 1.22 mmol/L (ref 1.15–1.40)
Chloride: 109 mmol/L (ref 98–111)
Creatinine, Ser: 1.2 mg/dL (ref 0.61–1.24)
Glucose, Bld: 93 mg/dL (ref 70–99)
HCT: 36 % — ABNORMAL LOW (ref 39.0–52.0)
Hemoglobin: 12.2 g/dL — ABNORMAL LOW (ref 13.0–17.0)
Potassium: 4.1 mmol/L (ref 3.5–5.1)
Sodium: 144 mmol/L (ref 135–145)
TCO2: 23 mmol/L (ref 22–32)

## 2023-08-09 LAB — GLUCOSE, CAPILLARY: Glucose-Capillary: 72 mg/dL (ref 70–99)

## 2023-08-09 SURGERY — IVC FILTER REMOVAL
Anesthesia: LOCAL

## 2023-08-09 MED ORDER — SODIUM CHLORIDE 0.9 % IV SOLN: INTRAVENOUS | Status: DC

## 2023-08-09 MED ORDER — FENTANYL CITRATE (PF) 100 MCG/2ML IJ SOLN
INTRAMUSCULAR | Status: DC | PRN
  Administered 2023-08-09: 50 ug via INTRAVENOUS

## 2023-08-09 MED ORDER — IODIXANOL 320 MG/ML IV SOLN
INTRAVENOUS | Status: DC | PRN
  Administered 2023-08-09: 5 mL

## 2023-08-09 MED ORDER — LIDOCAINE HCL (PF) 1 % IJ SOLN
INTRAMUSCULAR | Status: DC | PRN
  Administered 2023-08-09: 5 mL

## 2023-08-09 MED ORDER — MIDAZOLAM HCL 2 MG/2ML IJ SOLN
INTRAMUSCULAR | Status: DC | PRN
  Administered 2023-08-09: 1 mg via INTRAVENOUS

## 2023-08-09 MED ORDER — HEPARIN (PORCINE) IN NACL 1000-0.9 UT/500ML-% IV SOLN
INTRAVENOUS | Status: DC | PRN
  Administered 2023-08-09: 500 mL

## 2023-08-09 MED ORDER — MIDAZOLAM HCL 2 MG/2ML IJ SOLN
INTRAMUSCULAR | Status: AC
Start: 2023-08-09 — End: ?
  Filled 2023-08-09: qty 2

## 2023-08-09 MED ORDER — FENTANYL CITRATE (PF) 100 MCG/2ML IJ SOLN
INTRAMUSCULAR | Status: AC
  Filled 2023-08-09: qty 2

## 2023-08-09 MED ORDER — LIDOCAINE HCL (PF) 1 % IJ SOLN
INTRAMUSCULAR | Status: AC
  Filled 2023-08-09: qty 30

## 2023-08-09 SURGICAL SUPPLY — 7 items
COVER DOME SNAP 22 D (MISCELLANEOUS) IMPLANT
KIT MICROPUNCTURE NIT STIFF (SHEATH) IMPLANT
SET ATX-X65L (MISCELLANEOUS) IMPLANT
SET CLOVERSNARE FLT RETRIEVAL (MISCELLANEOUS) IMPLANT
SHEATH PROBE COVER 6X72 (BAG) IMPLANT
TRAY PV CATH (CUSTOM PROCEDURE TRAY) ×1 IMPLANT
WIRE BENTSON .035X145CM (WIRE) IMPLANT

## 2023-08-09 NOTE — H&P (Signed)
History of Present Illness: 82 y.o. 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 He has undergone right THA and has mostly recovered.        Past Medical History:  Diagnosis Date   Arthritis      Back   Bladder stones      NEEDS TO HAVE SURGERY   BPH (benign prostatic hyperplasia)     Diabetes mellitus      type  II   DVT (deep venous thrombosis) (HCC)     Eczema     Hyperlipidemia     Hypertension     Lumbar herniated disc                 Past Surgical History:  Procedure Laterality Date   CHOLECYSTECTOMY       COLONOSCOPY   03/24/2017    per Dr. Lavon Paganini, adenomatous polyps, no repeats due to age    CYSTOSCOPY W/ RETROGRADES Bilateral 07/20/2014    Procedure: Zettie Cooley;  Surgeon: Crist Fat, MD;  Location: [facility];  Service: Urology;  Laterality: Bilateral;   HOLMIUM LASER APPLICATION N/A 07/20/2014    Procedure: HOLMIUM LASER APPLICATION;  Surgeon: Crist Fat, MD;  Location: [facility];  Service: Urology;  Laterality: N/A;   INSERTION OF MESH N/A 10/19/2013    Procedure: INSERTION OF MESH;  Surgeon: Robyne Askew, MD;  Location: [facility] OR;  Service: General;  Laterality: N/A;   LUMBAR LAMINECTOMY/DECOMPRESSION MICRODISCECTOMY   09/26/2012    Procedure: LUMBAR LAMINECTOMY/DECOMPRESSION MICRODISCECTOMY 1 LEVEL;  Surgeon: Mariam Dollar, MD;  Location: [facility];  Service: Neurosurgery;  Laterality: Left;  Lumbar Lamiectomy Extraforaminal Diskectomy Lumbar Three-Four Left   TRANSURETHRAL RESECTION OF PROSTATE N/A 07/20/2014    Procedure: TRANSURETHRAL RESECTION OF THE PROSTATE (TURP) WITH GYRUS;  Surgeon: Crist Fat, MD;  Location: [facility];  Service: Urology;  Laterality: N/A;   VENTRAL HERNIA REPAIR   10/19/2013    DR TOTH   VENTRAL HERNIA REPAIR N/A 10/19/2013    Procedure: LAPAROSCOPIC VENTRAL HERNIA;  Surgeon: Robyne Askew, MD;  Location: [facility] OR;  Service: General;  Laterality: N/A;          Allergies       Allergies  Allergen  Reactions   Ace Inhibitors Cough      cough   Hydrocodone Nausea And Vomiting               Prior to Admission medications   Medication Sig Start Date End Date Taking? Authorizing Provider  amLODipine (NORVASC) 5 MG tablet TAKE 1 TABLET BY MOUTH EVERY MORNING 01/18/23   Yes Nelwyn Salisbury, MD  atorvastatin (LIPITOR) 20 MG tablet TAKE ONE TABLET BY MOUTH EVERY EVENING Patient taking differently: Take 20 mg by mouth in the morning. 01/18/23   Yes Nelwyn Salisbury, MD  benzonatate (TESSALON) 200 MG capsule Take 1 capsule (200 mg total) by mouth every 6 (six) hours as needed for cough. 03/30/23   Yes Nelwyn Salisbury, MD  cetirizine (KLS ALLER-TEC) 10 MG tablet Take 10 mg by mouth daily.     Yes [provider]  docusate sodium (COLACE) 100 MG capsule Take 1 capsule (100 mg total) by mouth 2 (two) times daily as needed (take to keep stool soft.). Patient taking differently: Take 100 mg by mouth in the morning. 07/20/14   Yes Crist Fat, MD  glipiZIDE (GLUCOTROL) 5 MG tablet TAKE ONE TABLET BY MOUTH TWICE A  DAY BEFORE A MEAL 07/24/22   Yes Nelwyn Salisbury, MD  losartan-hydrochlorothiazide [facility]) 50-12.5 MG tablet TAKE 1 TABLET BY MOUTH DAILY 03/17/23   Yes Nelwyn Salisbury, MD  Menthol, Topical Analgesic, (BIOFREEZE EX) Apply 1 Application topically as needed (pain/back pain.).     Yes [provider]  metFORMIN (GLUCOPHAGE) 500 MG tablet TAKE 1 TABLET BY MOUTH TWICE A DAY WITH A MEAL 01/18/23   Yes Nelwyn Salisbury, MD  methylPREDNISolone (MEDROL DOSEPAK) 4 MG TBPK tablet As directed 03/30/23   Yes Nelwyn Salisbury, MD  Multiple Vitamin (MULTIVITAMIN WITH MINERALS) TABS tablet Take 1 tablet by mouth daily. Centrum silver     Yes [provider]  Omega-3 Fatty Acids (FISH OIL PO) Take 1 capsule by mouth daily.     Yes [provider]  Probiotic Product (ALIGN PO) Take 1 tablet by mouth in the morning.     Yes [provider]  tobramycin-dexamethasone Wallene Dales)  ophthalmic solution Place 2 drops into the left eye every 4 (four) hours while awake. 03/30/23   Yes Nelwyn Salisbury, MD  Calcium Polycarbophil (FIBER-CAPS PO) Take 1 capsule by mouth in the morning.       [provider]      Social History         Socioeconomic History   Marital status: Married      Spouse name: Not on file   Number of children: Not on file   Years of education: Not on file   Highest education level: Not on file  Occupational History      Comment: retired Psychologist, occupational  Tobacco Use   Smoking status: Former      Current packs/day: 0.00      Types: Cigarettes      Quit date: 07/13/1973      Years since quitting: 49.7   Smokeless tobacco: Never   Tobacco comments:      quit > 46 years ago  Vaping Use   Vaping status: Not on file  Substance and Sexual Activity   Alcohol use: No      Alcohol/week: 0.0 standard drinks of alcohol   Drug use: No   Sexual activity: Not Currently  Other Topics Concern   Not on file  Social History Narrative    Retired Psychologist, 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    Lives with wife and dog who he has had for many years    Walks dog for exercise, but walking ability limited due to back and hip pain    Social Determinants of Health        Financial Resource Strain: Low Risk  (03/26/2023)    Overall Financial Resource Strain (CARDIA)     Difficulty of Paying Living Expenses: Not hard at all  Food Insecurity: No Food Insecurity (03/26/2023)    Hunger Vital Sign     Worried About Running Out of Food in the Last Year: Never true     Ran Out of Food in the Last Year: Never true  Transportation Needs: No Transportation Needs (03/26/2023)    PRAPARE - Therapist, art (Medical): No     Lack of Transportation (Non-Medical): No  Physical Activity: Insufficiently Active (03/26/2023)    Exercise Vital Sign     Days of Exercise per Week: 5 days     Minutes of Exercise per Session: 20 min  Stress: No Stress Concern Present  (03/26/2023)    [employer] of Occupational Health -  Occupational Stress Questionnaire     Feeling of Stress : Not at all  Social Connections: Moderately Isolated (03/26/2023)    Social Connection and Isolation Panel [NHANES]     Frequency of Communication with Friends and Family: More than three times a week     Frequency of Social Gatherings with Friends and Family: More than three times a week     Attends Religious Services: Never     Active Member of Clubs or Organizations: No     Attends Banker Meetings: Never     Marital Status: Married  Catering manager Violence: Not At Risk (03/26/2023)    Humiliation, Afraid, Rape, and Kick questionnaire     Fear of Current or Ex-Partner: No     Emotionally Abused: No     Physically Abused: No     Sexually Abused: No           Family History  Problem Relation Age of Onset   Diabetes Mother     Hypertension Mother     Esophageal cancer Sister     Colon cancer Neg Hx     Rectal cancer Neg Hx     Stomach cancer Neg Hx            ROS: No complaints today   Physical Examination   Vitals:   08/09/23 0635  BP: (!) 159/70  Pulse: (!) 57  Resp: 18  Temp: 98.3 F (36.8 C)  SpO2: 97%    Awake alert and oriented Nonlabored respirations Bilateral extremities without edema   CBC Labs (Brief)          Component Value Date/Time    WBC 7.6 10/23/2022 0823    RBC 4.63 10/23/2022 0823    HGB 14.5 10/23/2022 0823    HGB 13.9 12/08/2013 0907    HCT 42.2 10/23/2022 0823    HCT 41.2 12/08/2013 0907    PLT 167.0 10/23/2022 0823    PLT 199 12/08/2013 0907    MCV 91.2 10/23/2022 0823    MCV 88.0 12/08/2013 0907    MCH 30.3 07/21/2014 0401    MCHC 34.3 10/23/2022 0823    RDW 13.1 10/23/2022 0823    RDW 13.8 12/08/2013 0907    LYMPHSABS 2.5 10/23/2022 0823    LYMPHSABS 2.2 12/08/2013 0907    MONOABS 0.6 10/23/2022 0823    MONOABS 0.6 12/08/2013 0907    EOSABS 0.1 10/23/2022 0823    EOSABS 0.1 12/08/2013 0907     BASOSABS 0.1 10/23/2022 0823    BASOSABS 0.0 12/08/2013 0907        BMET Labs (Brief)          Component Value Date/Time    NA 144 10/23/2022 0823    NA 142 12/08/2013 0908    K 3.9 10/23/2022 0823    K 3.6 12/08/2013 0908    CL 108 10/23/2022 0823    CO2 22 10/23/2022 0823    CO2 25 12/08/2013 0908    GLUCOSE 97 10/23/2022 0823    GLUCOSE 211 (H) 12/08/2013 0908    BUN 26 (H) 10/23/2022 0823    BUN 12.7 12/08/2013 0908    CREATININE 1.47 10/23/2022 0823    CREATININE 1.0 12/08/2013 0908    CALCIUM 9.1 10/23/2022 0823    CALCIUM 9.8 12/08/2013 0908    GFRNONAA 73 (L) 07/21/2014 0401    GFRAA 84 (L) 07/21/2014 0401        COAGS: Recent Labs       Lab  Results  Component Value Date    INR 2.60 05/04/2014    INR 2.6 05/04/2014    INR 2.20 04/20/2014    PROTIME 31.2 (H) 05/04/2014    PROTIME 26.4 (H) 04/20/2014    PROTIME 43.2 (H) 04/13/2014          Non-Invasive Vascular Imaging:  Venous flow:   +------------------+----------+-------------------------+---------+  Location         Overall   Flow properties          Comments   +------------------+----------+-------------------------+---------+  Left common       Patent    Phasic; spontaneous;     Resolved.  femoral                     compressible                        +------------------+----------+-------------------------+---------+  Left femoral      ThrombosedNoncompressible          ---------  +------------------+----------+-------------------------+---------+  Left profunda     Patent    Compressible             ---------  femoral                                                         +------------------+----------+-------------------------+---------+  Left popliteal    Patent    Phasic; spontaneous;     Resolved.                              compressible                        +------------------+----------+-------------------------+---------+  Left posterior     Patent    Compressible             ---------  tibial                                                          +------------------+----------+-------------------------+---------+  Left peroneal     Patent    Compressible             ---------  +------------------+----------+-------------------------+---------+  Left             Patent    Compressible             ---------  saphenofemoral                                                  junction                                                        +------------------+----------+-------------------------+---------+  Left greater      Patent    Compressible             ---------  saphenous                                                       +------------------+----------+-------------------------+---------+  Left lesser       Patent    Compressible             ---------  saphenous                                                       +------------------+----------+-------------------------+---------+  Right common      Patent    Phasic; spontaneous;     ---------  femoral                     compressible                        +------------------+----------+-------------------------+---------+   -------------------------------------------------------------------  Summary:   - Deep vein thrombosis remains in the left femoral vein.  - Previous thrombosis of the CVF and popliteal vein appear    resolved.  - No evidence of deep vein thrombosis of the right common femoral    vein.     ASSESSMENT/PLAN: This is a 82 y.o. male with a strong personal history of DVT not currently anticoagulated.  He has had right THA and is now indicated for filter removal.    Tinlee Navarrette C. Randie Heinz, MD [facility] Office: [phone] Pager: [phone]

## 2023-08-09 NOTE — Op Note (Signed)
    Patient name: Bobby Lopez MRN: [id number] DOB: 1940-10-23 Sex: male  08/09/2023 Pre-operative Diagnosis: History of IVC filter placement Post-operative diagnosis:  Same Surgeon:  Luanna Salk. Randie Heinz, MD Procedure Performed: 1.  Ultrasound-guided cannulation right internal jugular vein 2.  Removal of IVC filter 3.  IVC venogram 4.  Moderate sedation with fentanyl Versed for 9 minutes  Indications: 82 year old male with history of multiple previous thrombotic episodes recently underwent right total hip arthroplasty and a filter was placed preoperatively.  He is now recovered from his surgery and is indicated for filter removal.  Findings: IVC filter was removed was noted to be entirely intact without any existing thrombus and completion venogram demonstrated no perforations or stenosis of the IVC.   Procedure:  The patient was identified in the holding area and taken to room 8.  The patient was then placed supine on the table and prepped and draped in the usual sterile fashion.  A time out was called.  Ultrasound was used to evaluate the right internal jugular vein which was patent and compressible.  The area was anesthetized 1% lidocaine and cannulated with a micropuncture needle followed by wire and a micropuncture sheath.  Concomitantly we administered fentanyl and Versed for total of 9 minutes and his vital signs were monitored throughout the case.  We placed a Bentson wire under fluoroscopic guidance into the IVC and then placed a filter retrieval sheath.  The filter was easily snared the hook and the sheath was advanced over the filter and the filter was removed and noted to be totally intact.  Completion venogram demonstrated no perforation or stenosis of the IVC.  The sheath was removed pressure held until hemostasis was obtained.  Patient tolerated procedure without any complication.  Contrast: 5 cc    Patriciaann Rabanal C. Randie Heinz, MD [facility] Office:  [phone] Pager: [phone]

## 2023-08-11 ENCOUNTER — Other Ambulatory Visit: Payer: Self-pay | Admitting: Family Medicine

## 2023-08-18 ENCOUNTER — Encounter: Payer: Self-pay | Admitting: Internal Medicine

## 2023-08-18 ENCOUNTER — Ambulatory Visit (INDEPENDENT_AMBULATORY_CARE_PROVIDER_SITE_OTHER): Payer: Medicare HMO | Admitting: Internal Medicine

## 2023-08-18 VITALS — BP 130/80 | HR 64 | Temp 97.9°F | Wt 186.3 lb

## 2023-08-18 DIAGNOSIS — L853 Xerosis cutis: Secondary | ICD-10-CM

## 2023-08-18 NOTE — Progress Notes (Signed)
Established Patient Office Visit     CC/Reason for Visit: itchy back  HPI: Bobby Lopez is a 82 y.o. male who is coming in today for the above mentioned reasons. For a couple weeks has been experiencing an itchy back and he thinks he might have a rash.   Past Medical/Surgical History: Past Medical History:  Diagnosis Date   Arthritis    Back   Bladder stones    NEEDS TO HAVE SURGERY   BPH (benign prostatic hyperplasia)    Diabetes mellitus    type  II   DVT (deep venous thrombosis) (HCC)    Eczema    History of kidney stones    Hyperlipidemia    Hypertension    Lumbar herniated disc     Past Surgical History:  Procedure Laterality Date   CHOLECYSTECTOMY     COLONOSCOPY  03/24/2017   per Dr. Lavon Paganini, adenomatous polyps, no repeats due to age    CYSTOSCOPY  06/24/2023   CYSTOSCOPY W/ RETROGRADES Bilateral 07/20/2014   Procedure: CYSTOLITHALOPAXY;  Surgeon: Crist Fat, MD;  Location: [facility];  Service: Urology;  Laterality: Bilateral;   HOLMIUM LASER APPLICATION N/A 07/20/2014   Procedure: HOLMIUM LASER APPLICATION;  Surgeon: Crist Fat, MD;  Location: [facility];  Service: Urology;  Laterality: N/A;   INSERTION OF MESH N/A 10/19/2013   Procedure: INSERTION OF MESH;  Surgeon: Robyne Askew, MD;  Location: [facility] OR;  Service: General;  Laterality: N/A;   IVC FILTER INSERTION N/A 04/05/2023   Procedure: IVC FILTER INSERTION;  Surgeon: Maeola Harman, MD;  Location: [facility] INVASIVE CV LAB;  Service: Cardiovascular;  Laterality: N/A;   IVC FILTER REMOVAL N/A 08/09/2023   Procedure: IVC FILTER REMOVAL;  Surgeon: Maeola Harman, MD;  Location: [facility] INVASIVE CV LAB;  Service: Cardiovascular;  Laterality: N/A;   LUMBAR LAMINECTOMY/DECOMPRESSION MICRODISCECTOMY  09/26/2012   Procedure: LUMBAR LAMINECTOMY/DECOMPRESSION MICRODISCECTOMY 1 LEVEL;  Surgeon: Mariam Dollar, MD;  Location: [facility];  Service: Neurosurgery;  Laterality: Left;  Lumbar  Lamiectomy Extraforaminal Diskectomy Lumbar Three-Four Left   TOTAL HIP ARTHROPLASTY Right 06/24/2023   Procedure: RIGHT TOTAL HIP ARTHROPLASTY ANTERIOR APPROACH;  Surgeon: Samson Frederic, MD;  Location: [facility];  Service: Orthopedics;  Laterality: Right;  140   TRANSURETHRAL RESECTION OF PROSTATE N/A 07/20/2014   Procedure: TRANSURETHRAL RESECTION OF THE PROSTATE (TURP) WITH GYRUS;  Surgeon: Crist Fat, MD;  Location: [facility];  Service: Urology;  Laterality: N/A;   VENTRAL HERNIA REPAIR  10/19/2013   DR TOTH   VENTRAL HERNIA REPAIR N/A 10/19/2013   Procedure: LAPAROSCOPIC VENTRAL HERNIA;  Surgeon: Robyne Askew, MD;  Location: [facility];  Service: General;  Laterality: N/A;    Social History:  reports that he quit smoking about 50 years ago. His smoking use included cigarettes. He has never used smokeless tobacco. He reports that he does not drink alcohol and does not use drugs.  Allergies: Allergies  Allergen Reactions   Ace Inhibitors Cough   Hydrocodone Nausea And Vomiting   Wound Dressing Adhesive Rash    Some dressings break him out    Family History:  Family History  Problem Relation Age of Onset   Diabetes Mother    Hypertension Mother    Esophageal cancer Sister    Colon cancer Neg Hx    Rectal cancer Neg Hx    Stomach cancer Neg Hx      Current Outpatient Medications:  apixaban (ELIQUIS) 2.5 MG TABS tablet, Take 1 tablet (2.5 mg total) by mouth 2 (two) times daily., Disp: 60 tablet, Rfl: 0   atorvastatin (LIPITOR) 20 MG tablet, TAKE ONE TABLET BY MOUTH EVERY EVENING, Disp: 90 tablet, Rfl: 0   cetirizine (KLS ALLER-TEC) 10 MG tablet, Take 10 mg by mouth daily., Disp: , Rfl:    docusate sodium (COLACE) 100 MG capsule, Take 100 mg by mouth daily., Disp: , Rfl:    glipiZIDE (GLUCOTROL) 5 MG tablet, TAKE ONE TABLET BY MOUTH TWICE A DAY BEFORE A MEAL, Disp: 180 tablet, Rfl: 4   Menthol, Topical Analgesic, (BIOFREEZE EX), Apply 1 Application topically 2 (two) times  daily., Disp: , Rfl:    metFORMIN (GLUCOPHAGE) 500 MG tablet, TAKE 1 TABLET BY MOUTH TWICE A DAY WITH A MEAL, Disp: 180 tablet, Rfl: 0   Multiple Vitamin (MULTIVITAMIN WITH MINERALS) TABS tablet, Take 1 tablet by mouth daily. Centrum silver, Disp: , Rfl:    Omega-3 Fatty Acids (FISH OIL PO), Take 1 capsule by mouth daily., Disp: , Rfl:    Probiotic Product (ALIGN PO), Take 1 tablet by mouth in the morning., Disp: , Rfl:   Current Facility-Administered Medications:    0.9 %  sodium chloride infusion, 500 mL, Intravenous, Continuous, Nandigam, Kavitha V, MD  Review of Systems:  Negative unless indicated in HPI.   Physical Exam: Vitals:   08/18/23 1303  BP: 130/80  Pulse: 64  Temp: 97.9 F (36.6 C)  TempSrc: Oral  SpO2: 99%  Weight: 186 lb 4.8 oz (84.5 kg)    Body mass index is 25.27 kg/m.   Physical Exam Skin:    Comments: Dry, scaly skin on back.      Impression and Plan:  Dry skin dermatitis  -Advised to apply intense healing moisturizing cream twice a day for a few weeks.   Time spent:22 minutes reviewing chart, interviewing and examining patient and formulating plan of care.     Chaya Jan, MD [facility]

## 2023-10-15 ENCOUNTER — Other Ambulatory Visit: Payer: Self-pay | Admitting: Family Medicine

## 2023-10-20 ENCOUNTER — Other Ambulatory Visit: Payer: Self-pay | Admitting: Family Medicine

## 2023-10-26 ENCOUNTER — Encounter: Payer: Medicare HMO | Admitting: Family Medicine

## 2023-11-05 ENCOUNTER — Encounter: Payer: Self-pay | Admitting: Family Medicine

## 2023-11-05 ENCOUNTER — Ambulatory Visit: Payer: Medicare HMO | Admitting: Family Medicine

## 2023-11-05 VITALS — BP 110/68 | HR 62 | Temp 97.8°F | Ht 72.0 in | Wt 183.4 lb

## 2023-11-05 DIAGNOSIS — N401 Enlarged prostate with lower urinary tract symptoms: Secondary | ICD-10-CM

## 2023-11-05 DIAGNOSIS — N138 Other obstructive and reflux uropathy: Secondary | ICD-10-CM

## 2023-11-05 DIAGNOSIS — I10 Essential (primary) hypertension: Secondary | ICD-10-CM

## 2023-11-05 DIAGNOSIS — E119 Type 2 diabetes mellitus without complications: Secondary | ICD-10-CM | POA: Diagnosis not present

## 2023-11-05 DIAGNOSIS — M150 Primary generalized (osteo)arthritis: Secondary | ICD-10-CM

## 2023-11-05 DIAGNOSIS — E782 Mixed hyperlipidemia: Secondary | ICD-10-CM

## 2023-11-05 DIAGNOSIS — Z7984 Long term (current) use of oral hypoglycemic drugs: Secondary | ICD-10-CM

## 2023-11-05 LAB — BASIC METABOLIC PANEL
BUN: 19 mg/dL (ref 6–23)
CO2: 27 meq/L (ref 19–32)
Calcium: 9.2 mg/dL (ref 8.4–10.5)
Chloride: 108 meq/L (ref 96–112)
Creatinine, Ser: 1.23 mg/dL (ref 0.40–1.50)
GFR: 54.5 mL/min — ABNORMAL LOW (ref >60.00)
Glucose, Bld: 106 mg/dL — ABNORMAL HIGH (ref 70–99)
Potassium: 4.8 meq/L (ref 3.5–5.1)
Sodium: 142 meq/L (ref 135–145)

## 2023-11-05 LAB — LIPID PANEL
Cholesterol: 95 mg/dL (ref 0–200)
HDL: 43 mg/dL (ref >39.00)
LDL Cholesterol: 39 mg/dL (ref 0–99)
NonHDL: 51.56
Total CHOL/HDL Ratio: 2
Triglycerides: 64 mg/dL (ref 0.0–149.0)
VLDL: 12.8 mg/dL (ref 0.0–40.0)

## 2023-11-05 LAB — HEPATIC FUNCTION PANEL
ALT: 9 U/L (ref 0–53)
AST: 14 U/L (ref 0–37)
Albumin: 4.2 g/dL (ref 3.5–5.2)
Alkaline Phosphatase: 45 U/L (ref 39–117)
Bilirubin, Direct: 0.2 mg/dL (ref 0.0–0.3)
Total Bilirubin: 1 mg/dL (ref 0.2–1.2)
Total Protein: 6.9 g/dL (ref 6.0–8.3)

## 2023-11-05 LAB — CBC WITH DIFFERENTIAL/PLATELET
Basophils Absolute: 0.1 10*3/uL (ref 0.0–0.1)
Basophils Relative: 0.8 % (ref 0.0–3.0)
Eosinophils Absolute: 0.1 10*3/uL (ref 0.0–0.7)
Eosinophils Relative: 1 % (ref 0.0–5.0)
HCT: 41 % (ref 39.0–52.0)
Hemoglobin: 13.6 g/dL (ref 13.0–17.0)
Lymphocytes Relative: 26.5 % (ref 12.0–46.0)
Lymphs Abs: 1.9 10*3/uL (ref 0.7–4.0)
MCHC: 33.1 g/dL (ref 30.0–36.0)
MCV: 88.8 fL (ref 78.0–100.0)
Monocytes Absolute: 0.6 10*3/uL (ref 0.1–1.0)
Monocytes Relative: 8.1 % (ref 3.0–12.0)
Neutro Abs: 4.5 10*3/uL (ref 1.4–7.7)
Neutrophils Relative %: 63.6 % (ref 43.0–77.0)
Platelets: 143 10*3/uL — ABNORMAL LOW (ref 150.0–400.0)
RBC: 4.62 Mil/uL (ref 4.22–5.81)
RDW: 13.8 % (ref 11.5–15.5)
WBC: 7.1 10*3/uL (ref 4.0–10.5)

## 2023-11-05 LAB — PSA: PSA: 2.21 ng/mL (ref 0.10–4.00)

## 2023-11-05 LAB — TSH: TSH: 3.35 u[IU]/mL (ref 0.35–5.50)

## 2023-11-05 LAB — HEMOGLOBIN A1C: Hgb A1c MFr Bld: 5.5 % (ref 4.6–6.5)

## 2023-11-05 MED ORDER — METFORMIN HCL 500 MG PO TABS: 500.0000 mg | ORAL_TABLET | Freq: Two times a day (BID) | ORAL | 3 refills | Status: AC

## 2023-11-05 NOTE — Progress Notes (Signed)
Subjective:    Patient ID: Bobby Lopez, male    DOB: 12-09-1940, 83 y.o.   MRN: [id number]  HPI Here to follow up on issues. He feels well in general. His BP has been stable. He does have diffuse joint pains, and he takes Tylenol. He asks if there is something "natural" he could take to help with this. He limites his intake of carbs and sugars. He had [name] removed in November.    Review of Systems  Constitutional: Negative.   HENT: Negative.    Eyes: Negative.   Respiratory: Negative.    Cardiovascular: Negative.   Gastrointestinal: Negative.   Genitourinary: Negative.   Musculoskeletal:  Positive for arthralgias.  Skin: Negative.   Neurological: Negative.   Psychiatric/Behavioral: Negative.         Objective:   Physical Exam Constitutional:      General: He is not in acute distress.    Appearance: Normal appearance. He is well-developed. He is not diaphoretic.  HENT:     Head: Normocephalic and atraumatic.     Right Ear: External ear normal.     Left Ear: External ear normal.     Nose: Nose normal.     Mouth/Throat:     Pharynx: No oropharyngeal exudate.  Eyes:     General: No scleral icterus.       Right eye: No discharge.        Left eye: No discharge.     Conjunctiva/sclera: Conjunctivae normal.     Pupils: Pupils are equal, round, and reactive to light.  Neck:     Thyroid: No thyromegaly.     Vascular: No JVD.     Trachea: No tracheal deviation.  Cardiovascular:     Rate and Rhythm: Normal rate and regular rhythm.     Pulses: Normal pulses.     Heart sounds: Normal heart sounds. No murmur heard.    No friction rub. No gallop.  Pulmonary:     Effort: Pulmonary effort is normal. No respiratory distress.     Breath sounds: Normal breath sounds. No wheezing or rales.  Chest:     Chest wall: No tenderness.  Abdominal:     General: Bowel sounds are normal. There is no distension.     Palpations: Abdomen is soft. There is no mass.     Tenderness:  There is no abdominal tenderness. There is no guarding or rebound.  Genitourinary:    Penis: Normal. No tenderness.      Testes: Normal.  Musculoskeletal:        General: No tenderness. Normal range of motion.     Cervical back: Neck supple.  Lymphadenopathy:     Cervical: No cervical adenopathy.  Skin:    General: Skin is warm and dry.     Coloration: Skin is not pale.     Findings: No erythema or rash.  Neurological:     General: No focal deficit present.     Mental Status: He is alert and oriented to person, place, and time.     Cranial Nerves: No cranial nerve deficit.     Motor: No abnormal muscle tone.     Coordination: Coordination normal.     Deep Tendon Reflexes: Reflexes are normal and symmetric. Reflexes normal.  Psychiatric:        Mood and Affect: Mood normal.        Behavior: Behavior normal.        Thought Content: Thought content normal.  Judgment: Judgment normal.           Assessment & Plan:  His id doing well. His HTN is stable. For the OA, I suggested her try using turmeric OTC. His BPH is stable. We wil get fasting labs to check lipids, an A1c, etc. We spent a total of (32   ) minutes reviewing records and discussing these issues.  Gershon Crane, MD

## 2023-11-11 NOTE — Progress Notes (Signed)
 I don't think that is needed right now. The level is only slightly below normal. I usually refer patients to a specialist if the count gets below 120. However if he would like a referral to someone, I would be happy to do so

## 2024-01-18 ENCOUNTER — Other Ambulatory Visit: Payer: Self-pay | Admitting: Family Medicine

## 2024-03-29 ENCOUNTER — Ambulatory Visit (INDEPENDENT_AMBULATORY_CARE_PROVIDER_SITE_OTHER): Payer: Medicare HMO

## 2024-03-29 VITALS — Ht 72.0 in | Wt 183.0 lb

## 2024-03-29 DIAGNOSIS — Z0000 Encounter for general adult medical examination without abnormal findings: Secondary | ICD-10-CM

## 2024-03-29 NOTE — Progress Notes (Signed)
 Subjective:   Bobby Lopez is a 83 y.o. who presents for a Medicare Wellness preventive visit.  As a reminder, Annual Wellness Visits don't include a physical exam, and some assessments may be limited, especially if this visit is performed virtually. We may recommend an in-person follow-up visit with your provider if needed.  Visit Complete: Virtual I connected with  Bobby Lopez on 03/29/24 by a audio enabled telemedicine application and verified that I am speaking with the correct person using two identifiers.  Patient Location: Home  Provider Location: Home Office  I discussed the limitations of evaluation and management by telemedicine. The patient expressed understanding and agreed to proceed.  Vital Signs: Because this visit was a virtual/telehealth visit, some criteria may be missing or patient reported. Any vitals not documented were not able to be obtained and vitals that have been documented are patient reported.    Persons Participating in Visit: Patient.  AWV Questionnaire: No: Patient Medicare AWV questionnaire was not completed prior to this visit.  Cardiac Risk Factors include: advanced age (>33men, >90 women);male gender;diabetes mellitus;hypertension     Objective:    Today's Vitals   03/29/24 1424  Weight: 183 lb (83 kg)  Height: 6' (1.829 m)   Body mass index is 24.82 kg/m.     03/29/2024    2:31 PM 08/09/2023    7:21 AM 06/24/2023    2:33 PM 06/14/2023    9:40 AM 04/09/2023    9:32 AM 04/05/2023    7:27 AM 03/26/2023    2:00 PM  Advanced Directives  Does Patient Have a Medical Advance Directive? Yes Yes Yes No;Yes Yes Yes No  Type of Estate agent of [place];Living will Healthcare Power of [place];Living will Healthcare Power of [place];Living will Healthcare Power of [place];Living will  Healthcare Power of [place];Living will   Does patient want to make changes to medical advance directive? No - Patient declined  No -  Patient declined  No - Patient declined    Copy of Healthcare Power of Attorney in Chart? Yes - validated most recent copy scanned in chart (See row information)     No - copy requested   Would patient like information on creating a medical advance directive?      No - Patient declined No - Patient declined    Current Medications (verified) Outpatient Encounter Medications as of 03/29/2024  Medication Sig   atorvastatin  (LIPITOR) 20 MG tablet TAKE 1 TABLET BY MOUTH EVERY EVENING   cetirizine (KLS ALLER-TEC) 10 MG tablet Take 10 mg by mouth daily.   docusate sodium  (COLACE) 100 MG capsule Take 100 mg by mouth daily.   glipiZIDE  (GLUCOTROL ) 5 MG tablet TAKE 1 TABLET BY MOUTH TWICE A DAY BEFORE A MEAL   Menthol , Topical Analgesic, (BIOFREEZE EX) Apply 1 Application topically 2 (two) times daily.   metFORMIN  (GLUCOPHAGE ) 500 MG tablet Take 1 tablet (500 mg total) by mouth 2 (two) times daily with a meal.   Multiple Vitamin (MULTIVITAMIN WITH MINERALS) TABS tablet Take 1 tablet by mouth daily. Centrum silver   Omega-3 Fatty Acids (FISH OIL PO) Take 1 capsule by mouth daily.   Probiotic Product (ALIGN PO) Take 1 tablet by mouth in the morning.   Facility-Administered Encounter Medications as of 03/29/2024  Medication   0.9 %  sodium chloride  infusion    Allergies (verified) Ace inhibitors, Hydrocodone, and Wound dressing adhesive   History: Past Medical History:  Diagnosis Date   Arthritis  Back   Bladder stones    NEEDS TO HAVE SURGERY   BPH (benign prostatic hyperplasia)    Diabetes mellitus    type  II   DVT (deep venous thrombosis) (HCC)    Eczema    History of kidney stones    Hyperlipidemia    Hypertension    Lumbar herniated disc    Past Surgical History:  Procedure Laterality Date   CHOLECYSTECTOMY     COLONOSCOPY  03/24/2017   per Dr. Shila, adenomatous polyps, no repeats due to age    CYSTOSCOPY  06/24/2023   CYSTOSCOPY W/ RETROGRADES Bilateral 07/20/2014    Procedure: CYSTOLITHALOPAXY;  Surgeon: Morene LELON Salines, MD;  Location: [facility];  Service: Urology;  Laterality: Bilateral;   HOLMIUM LASER APPLICATION N/A 07/20/2014   Procedure: HOLMIUM LASER APPLICATION;  Surgeon: Morene LELON Salines, MD;  Location: [facility];  Service: Urology;  Laterality: N/A;   INSERTION OF MESH N/A 10/19/2013   Procedure: INSERTION OF MESH;  Surgeon: Deward GORMAN Curvin DOUGLAS, MD;  Location: [facility] OR;  Service: General;  Laterality: N/A;   IVC FILTER INSERTION N/A 04/05/2023   Procedure: IVC FILTER INSERTION;  Surgeon: Sheree Penne Bruckner, MD;  Location: [facility] INVASIVE CV LAB;  Service: Cardiovascular;  Laterality: N/A;   IVC FILTER REMOVAL N/A 08/09/2023   Procedure: IVC FILTER REMOVAL;  Surgeon: Sheree Penne Bruckner, MD;  Location: [facility] INVASIVE CV LAB;  Service: Cardiovascular;  Laterality: N/A;   LUMBAR LAMINECTOMY/DECOMPRESSION MICRODISCECTOMY  09/26/2012   Procedure: LUMBAR LAMINECTOMY/DECOMPRESSION MICRODISCECTOMY 1 LEVEL;  Surgeon: Arley SHAUNNA Helling, MD;  Location: [facility];  Service: Neurosurgery;  Laterality: Left;  Lumbar Lamiectomy Extraforaminal Diskectomy Lumbar Three-Four Left   TOTAL HIP ARTHROPLASTY Right 06/24/2023   Procedure: RIGHT TOTAL HIP ARTHROPLASTY ANTERIOR APPROACH;  Surgeon: Fidel Rogue, MD;  Location: [facility];  Service: Orthopedics;  Laterality: Right;  140   TRANSURETHRAL RESECTION OF PROSTATE N/A 07/20/2014   Procedure: TRANSURETHRAL RESECTION OF THE PROSTATE (TURP) WITH GYRUS;  Surgeon: Morene LELON Salines, MD;  Location: [facility];  Service: Urology;  Laterality: N/A;   VENTRAL HERNIA REPAIR  10/19/2013   DR TOTH   VENTRAL HERNIA REPAIR N/A 10/19/2013   Procedure: LAPAROSCOPIC VENTRAL HERNIA;  Surgeon: Deward GORMAN Curvin DOUGLAS, MD;  Location: [facility] OR;  Service: General;  Laterality: N/A;   Family History  Problem Relation Age of Onset   Diabetes Mother    Hypertension Mother    Esophageal cancer Sister    Colon cancer Neg Hx    Rectal cancer Neg Hx    Stomach  cancer Neg Hx    Social History   Socioeconomic History   Marital status: Married    Spouse name: pat   Number of children: 1   Years of education: Not on file   Highest education level: Some college, no degree  Occupational History    Comment: retired Psychologist, occupational  Tobacco Use   Smoking status: Former    Current packs/day: 0.00    Types: Cigarettes    Quit date: 07/13/1973    Years since quitting: 50.7   Smokeless tobacco: Never   Tobacco comments:    quit > 46 years ago  Vaping Use   Vaping status: Never Used  Substance and Sexual Activity   Alcohol  use: No    Alcohol /week: 0.0 standard drinks of alcohol    Drug use: No   Sexual activity: Not Currently  Other Topics Concern   Not on file  Social History Narrative   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   Lives with wife and dog who he has had for many years   Walks dog for exercise, but walking ability limited due to back and hip pain   Social Drivers of Health   Financial Resource Strain: Low Risk  (03/29/2024)   Overall Financial Resource Strain (CARDIA)    Difficulty of Paying Living Expenses: Not hard at all  Food Insecurity: No Food Insecurity (03/29/2024)   Hunger Vital Sign    Worried About Running Out of Food in the Last Year: Never true    Ran Out of Food in the Last Year: Never true  Transportation Needs: No Transportation Needs (03/29/2024)   PRAPARE - Administrator, Civil Service (Medical): No    Lack of Transportation (Non-Medical): No  Physical Activity: Sufficiently Active (03/29/2024)   Exercise Vital Sign    Days of Exercise per Week: 7 days    Minutes of Exercise per Session: 30 min  Stress: No Stress Concern Present (03/29/2024)   [employer] of Occupational Health - Occupational Stress Questionnaire    Feeling of Stress: Not at all  Social Connections: Moderately Isolated (03/29/2024)   Social Connection and Isolation Panel    Frequency of Communication with Friends and Family: More than  three times a week    Frequency of Social Gatherings with Friends and Family: More than three times a week    Attends Religious Services: Never    Database administrator or Organizations: No    Attends Engineer, structural: Never    Marital Status: Married    Tobacco Counseling Counseling given: Not Answered Tobacco comments: quit > 46 years ago    Clinical Intake:  Pre-visit preparation completed: Yes  Pain : No/denies pain     BMI - recorded: 24.82 Nutritional Status: BMI of 19-24  Normal Nutritional Risks: None Diabetes: Yes CBG done?: No Did pt. bring in CBG monitor from home?: No  Lab Results  Component Value Date   HGBA1C 5.5 11/05/2023   HGBA1C 5.5 06/14/2023   HGBA1C 5.6 04/09/2023     How often do you need to have someone help you when you read instructions, pamphlets, or other written materials from your doctor or pharmacy?: 1 - Never  Interpreter Needed?: No  Information entered by :: Rojelio Blush LPN   Activities of Daily Living     03/29/2024    2:30 PM 06/24/2023    2:33 PM  In your present state of health, do you have any difficulty performing the following activities:  Hearing? 0 1  Vision? 0 0  Difficulty concentrating or making decisions? 0 0  Walking or climbing stairs? 0   Dressing or bathing? 0   Doing errands, shopping? 0 0  Preparing Food and eating ? N   Using the Toilet? N   In the past six months, have you accidently leaked urine? N   Do you have problems with loss of bowel control? N   Managing your Medications? N   Managing your Finances? N   Housekeeping or managing your Housekeeping? N     Patient Care Team: Johnny Garnette LABOR, MD as PCP - General Cam Morene ORN, MD as Attending Physician (Urology) Lonn Hicks, MD as Consulting Physician (Hematology and Oncology) Onetha Kuba, MD as Consulting Physician (Neurosurgery) Liane Sharyne MATSU, [facility] (Inactive) as Pharmacist (Pharmacist) Shane Steffan BROCKS, MD as  Consulting Physician (Urology)  I have updated your Care Teams any recent Medical Services you  may have received from other providers in the past year.     Assessment:   This is a routine wellness examination for Bobby Lopez.  Hearing/Vision screen Hearing Screening - Comments:: Denies hearing difficulties   Vision Screening - Comments:: Wears rx glasses - up to date with routine eye exams with  Dr Charmayne   Goals Addressed               This Visit's Progress     Increase physical activity (pt-stated)        Remain active.       Depression Screen     03/29/2024    2:29 PM 08/18/2023    1:08 PM 07/19/2023   11:57 AM 07/09/2023    9:25 AM 03/26/2023    1:58 PM 10/23/2022    8:35 AM 03/20/2022    9:34 AM  PHQ 2/9 Scores  PHQ - 2 Score 0 0 0 0 0 0 0  PHQ- 9 Score  2 2 2   0     Fall Risk     03/29/2024    2:30 PM 08/18/2023    1:07 PM 07/19/2023   11:57 AM 07/09/2023    9:24 AM 03/26/2023    1:59 PM  Fall Risk   Falls in the past year? 1 0 1 1 1   Number falls in past yr: 0 0 1 1 0  Injury with Fall? 0 0 0 0 0  Comment     Followed by medical atention  Risk for fall due to : No Fall Risks  History of fall(s) History of fall(s) No Fall Risks  Follow up Falls evaluation completed Falls evaluation completed Falls evaluation completed Follow up appointment;Falls evaluation completed;Education provided;Falls prevention discussed Falls prevention discussed    MEDICARE RISK AT HOME:  Medicare Risk at Home Any stairs in or around the home?: Yes If so, are there any without handrails?: No Home free of loose throw rugs in walkways, pet beds, electrical cords, etc?: Yes Adequate lighting in your home to reduce risk of falls?: Yes Life alert?: No Use of a cane, walker or w/c?: No Grab bars in the bathroom?: Yes Shower chair or bench in shower?: No Elevated toilet seat or a handicapped toilet?: Yes  TIMED UP AND GO:  Was the test performed?  No  Cognitive Function: 6CIT  completed    10/06/2017    8:53 AM  MMSE - Mini Mental State Exam  Not completed: --        03/29/2024    2:31 PM 03/26/2023    2:00 PM 03/20/2022    9:39 AM  6CIT Screen  What Year? 0 points 0 points 0 points  What month? 0 points 0 points 0 points  What time? 0 points 0 points 0 points  Count back from 20 0 points 0 points 0 points  Months in reverse 0 points 4 points 0 points  Repeat phrase 0 points 0 points 0 points  Total Score 0 points 4 points 0 points    Immunizations Immunization History  Administered Date(s) Administered   Fluad Quad(high Dose 65+) 07/04/2019, 07/01/2020, 05/29/2023   Influenza Split 06/20/2012, 06/01/2013   Influenza Whole 06/25/2008, 06/18/2009   Influenza, High Dose Seasonal PF 07/10/2015, 06/05/2016, 05/24/2018, 06/16/2022   Influenza,inj,quad, With Preservative 06/14/2017   Influenza-Unspecified 06/11/2014, 06/14/2017, 06/17/2021   Moderna Covid-19 Fall Seasonal Vaccine 34yrs & older 05/29/2023   Moderna Sars-Covid-2 Vaccination 09/25/2019, 10/23/2019, 05/03/2020, 12/23/2020   Pfizer(Comirnaty)Fall Seasonal Vaccine 12 years and older  06/24/2022   Pneumococcal Conjugate-13 10/01/2014   Pneumococcal Polysaccharide-23 07/30/2011   Rsv, Bivalent, Protein Subunit Rsvpref,pf [place]) 07/07/2022   Tdap 04/10/2016   Zoster Recombinant(Shingrix) 10/19/2018, 12/22/2018   Zoster, Live 09/11/2014    Screening Tests Health Maintenance  Topic Date Due   Diabetic kidney evaluation - Urine ACR  07/29/2011   FOOT EXAM  10/11/2019   COVID-19 Vaccine (7 - 2024-25 season) 11/26/2023   OPHTHALMOLOGY EXAM  04/11/2024   INFLUENZA VACCINE  04/14/2024   HEMOGLOBIN A1C  05/04/2024   Diabetic kidney evaluation - eGFR measurement  11/04/2024   Medicare Annual Wellness (AWV)  03/29/2025   DTaP/Tdap/Td (2 - Td or Tdap) 04/10/2026   Pneumococcal Vaccine: 50+ Years  Completed   Zoster Vaccines- Shingrix  Completed   Hepatitis B Vaccines  Aged Out   HPV VACCINES   Aged Out   Meningococcal B Vaccine  Aged Out    Health Maintenance  Health Maintenance Due  Topic Date Due   Diabetic kidney evaluation - Urine ACR  07/29/2011   FOOT EXAM  10/11/2019   COVID-19 Vaccine (7 - 2024-25 season) 11/26/2023   Health Maintenance Items Addressed:   Additional Screening:  Vision Screening: Recommended annual ophthalmology exams for early detection of glaucoma and other disorders of the eye. Would you like a referral to an eye doctor? No    Dental Screening: Recommended annual dental exams for proper oral hygiene  Community Resource Referral / Chronic Care Management: CRR required this visit?  No   CCM required this visit?  No   Plan:    I have personally reviewed and noted the following in the patient's chart:   Medical and social history Use of alcohol , tobacco or illicit drugs  Current medications and supplements including opioid prescriptions. Patient is not currently taking opioid prescriptions. Functional ability and status Nutritional status Physical activity Advanced directives List of other physicians Hospitalizations, surgeries, and ER visits in previous 12 months Vitals Screenings to include cognitive, depression, and falls Referrals and appointments  In addition, I have reviewed and discussed with patient certain preventive protocols, quality metrics, and best practice recommendations. A written personalized care plan for preventive services as well as general preventive health recommendations were provided to patient.   Rojelio LELON Blush, LPN   2/83/7974   After Visit Summary: (MyChart) Due to this being a telephonic visit, the after visit summary with patients personalized plan was offered to patient via MyChart   Notes: Nothing significant to report at this time.

## 2024-03-29 NOTE — Patient Instructions (Addendum)
 Bobby Lopez , Thank you for taking time out of your busy schedule to complete your Annual Wellness Visit with me. I enjoyed our conversation and look forward to speaking with you again next year. I, as well as your care team,  appreciate your ongoing commitment to your health goals. Please review the following plan we discussed and let me know if I can assist you in the future. Your Game plan/ To Do List    Referrals: If you haven't heard from the office you've been referred to, please reach out to them at the phone provided.   Follow up Visits: Next Medicare AWV with our clinical staff: 04/04/25 @ 2:20p   Have you seen your provider in the last 6 months (3 months if uncontrolled diabetes)?  Next Office Visit with your provider: 12/11/24 @ 8:45a  Clinician Recommendations:  Aim for 30 minutes of exercise or brisk walking, 6-8 glasses of water , and 5 servings of fruits and vegetables each day.       This is a list of the screening recommended for you and due dates:  Health Maintenance  Topic Date Due   Yearly kidney health urinalysis for diabetes  07/29/2011   Complete foot exam   10/11/2019   COVID-19 Vaccine (7 - 2024-25 season) 11/26/2023   Eye exam for diabetics  04/11/2024   Flu Shot  04/14/2024   Hemoglobin A1C  05/04/2024   Yearly kidney function blood test for diabetes  11/04/2024   Medicare Annual Wellness Visit  03/29/2025   DTaP/Tdap/Td vaccine (2 - Td or Tdap) 04/10/2026   Pneumococcal Vaccine for age over 67  Completed   Zoster (Shingles) Vaccine  Completed   Hepatitis B Vaccine  Aged Out   HPV Vaccine  Aged Out   Meningitis B Vaccine  Aged Out    Advanced directives: (In Chart) A copy of your advanced directives are scanned into your chart should your provider ever need it. Advance Care Planning is important because it:  [x]  Makes sure you receive the medical care that is consistent with your values, goals, and preferences  [x]  It provides guidance to your family and  loved ones and reduces their decisional burden about whether or not they are making the right decisions based on your wishes.  Follow the link provided in your after visit summary or read over the paperwork we have mailed to you to help you started getting your Advance Directives in place. If you need assistance in completing these, please reach out to us  so that we can help you!  See attachments for Preventive Care and Fall Prevention Tips.

## 2024-04-15 ENCOUNTER — Other Ambulatory Visit: Payer: Self-pay | Admitting: Family Medicine

## 2024-07-08 ENCOUNTER — Other Ambulatory Visit: Payer: Self-pay | Admitting: Family Medicine

## 2024-08-07 ENCOUNTER — Telehealth: Payer: Self-pay

## 2024-08-07 DIAGNOSIS — E119 Type 2 diabetes mellitus without complications: Secondary | ICD-10-CM

## 2024-08-07 NOTE — Telephone Encounter (Signed)
 Copied from CRM [id number]. Topic: Clinical - Request for Lab/Test Order >> Aug 07, 2024 11:06 AM Anairis L wrote: Reason for CRM: Perla from [facility] care advocate is requesting a Urine and blood test for kidneys.   [phone]

## 2024-08-09 NOTE — Addendum Note (Signed)
 Addended by: JOHNNY SENIOR A on: 08/09/2024 12:46 PM   Modules accepted: Orders

## 2024-08-09 NOTE — Telephone Encounter (Signed)
 Spoke with pt regarding appointment to have labs done, pt stated that he told [employer] that he has a CPE in 03/26 and he will have the lab and urine done them.

## 2024-08-09 NOTE — Telephone Encounter (Signed)
I put in the lab orders  

## 2024-09-18 ENCOUNTER — Ambulatory Visit: Payer: Self-pay

## 2024-09-18 NOTE — Telephone Encounter (Signed)
 FYI Only or Action Required?: FYI only for provider: appointment scheduled on 09/20/24.  Patient was last seen in primary care on 11/05/2023 by Johnny Garnette LABOR, MD.  Called Nurse Triage reporting Rash.  Symptoms began several weeks ago.  Interventions attempted: OTC medications: claritin , aquaphor.  Symptoms are: gradually worsening.  Triage Disposition: See PCP When Office is Open (Within 3 Days)  Patient/caregiver understands and will follow disposition?: Yes  Reason for Disposition  Localized rash present > 7 days  Answer Assessment - Initial Assessment Questions Wife reports rash on patient's stomach x 3 weeks and the majority of his back. Denies fever. States is pruritic. Has been applying Aquaphor with no improvement. States already takes claritin  daily.   1. APPEARANCE of RASH: What does the rash look like? (e.g., blisters, dry flaky skin, red spots, redness, sores)     Small and large spots, flat 2. LOCATION: Where is the rash located?      Back and abdomen 3. NUMBER: How many spots are there?      Many 4. SIZE: How big are the spots? (e.g., inches, cm; or compare to size of pinhead, tip of pen, eraser, pea)      Small and large 5. ONSET: When did the rash start?      3 weeks 6. ITCHING: Does the rash itch? If Yes, ask: How bad is the itch?  (Scale 0-10; or none, mild, moderate, severe)     Yes 7. PAIN: Does the rash hurt? If Yes, ask: How bad is the pain?  (Scale 0-10; or none, mild, moderate, severe)     States is irritated 8. OTHER SYMPTOMS: Do you have any other symptoms? (e.g., fever)     Denies  Protocols used: Rash or Redness - Localized-A-AH   Message from [place] D sent at 09/18/2024  8:33 AM EST  Reason for Triage:rash on the back and stomach that is itchy Call wife Bruna [phone]

## 2024-09-18 NOTE — Telephone Encounter (Signed)
 Noted

## 2024-09-20 ENCOUNTER — Encounter: Payer: Self-pay | Admitting: Family Medicine

## 2024-09-20 ENCOUNTER — Ambulatory Visit: Admitting: Family Medicine

## 2024-09-20 VITALS — BP 110/60 | HR 42 | Temp 98.1°F | Wt 186.0 lb

## 2024-09-20 DIAGNOSIS — L309 Dermatitis, unspecified: Secondary | ICD-10-CM

## 2024-09-20 MED ORDER — CLOBETASOL PROPIONATE 0.05 % EX CREA: 1.0000 | TOPICAL_CREAM | Freq: Two times a day (BID) | CUTANEOUS | 2 refills | Status: AC

## 2024-09-20 NOTE — Progress Notes (Signed)
" ° °  Subjective:    Patient ID: Bobby Lopez, male    DOB: 12-14-1940, 84 y.o.   MRN: [id number]  HPI Here for 3 weeks of an itchy rash on his trunk. He uses [employer]. He has been applying Aquaphor lotion twice daily. Not recent medication changes.    Review of Systems  Constitutional: Negative.   Respiratory: Negative.    Cardiovascular: Negative.   Skin:  Positive for rash.       Objective:   Physical Exam Constitutional:      Appearance: Normal appearance.  Cardiovascular:     Rate and Rhythm: Normal rate and regular rhythm.     Pulses: Normal pulses.     Heart sounds: Normal heart sounds.  Pulmonary:     Effort: Pulmonary effort is normal.     Breath sounds: Normal breath sounds.  Skin:    Comments: There are widespread areas of macular erythema on the chest, abdomen, and back   Neurological:     Mental Status: He is alert.           Assessment & Plan:  Eczema, treat with Clobetasol  cream BID. Garnette Olmsted, MD   "

## 2024-10-03 ENCOUNTER — Other Ambulatory Visit: Payer: Self-pay | Admitting: Family Medicine

## 2024-12-11 ENCOUNTER — Encounter: Admitting: Family Medicine

## 2025-04-04 ENCOUNTER — Ambulatory Visit
